# Patient Record
Sex: Female | Born: 1995 | Race: White | Hispanic: No | Marital: Married | Smoking: Former smoker
Health system: Southern US, Community
[De-identification: ages and names within clinical notes are randomized; demographics above are authoritative.]

## PROBLEM LIST (undated history)

## (undated) DIAGNOSIS — Z91018 Allergy to other foods: Secondary | ICD-10-CM

## (undated) DIAGNOSIS — I10 Essential (primary) hypertension: Secondary | ICD-10-CM

## (undated) DIAGNOSIS — O10913 Unspecified pre-existing hypertension complicating pregnancy, third trimester: Secondary | ICD-10-CM

## (undated) DIAGNOSIS — F419 Anxiety disorder, unspecified: Secondary | ICD-10-CM

## (undated) DIAGNOSIS — I4891 Unspecified atrial fibrillation: Secondary | ICD-10-CM

## (undated) HISTORY — PX: WISDOM TOOTH EXTRACTION: SHX21

## (undated) HISTORY — DX: Unspecified atrial fibrillation: I48.91

## (undated) HISTORY — DX: Anxiety disorder, unspecified: F41.9

## (undated) HISTORY — PX: NO PAST SURGERIES: SHX2092

## (undated) HISTORY — DX: Unspecified pre-existing hypertension complicating pregnancy, third trimester: O10.913

---
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
## 2014-06-07 IMAGING — CR DG CHEST 2V
2 series · 2 of 2 positions shown · non-contrast
Comparison: None.

CLINICAL DATA: Trauma.  Animal bite.

CHEST - 2 VIEW

[x chest ap]
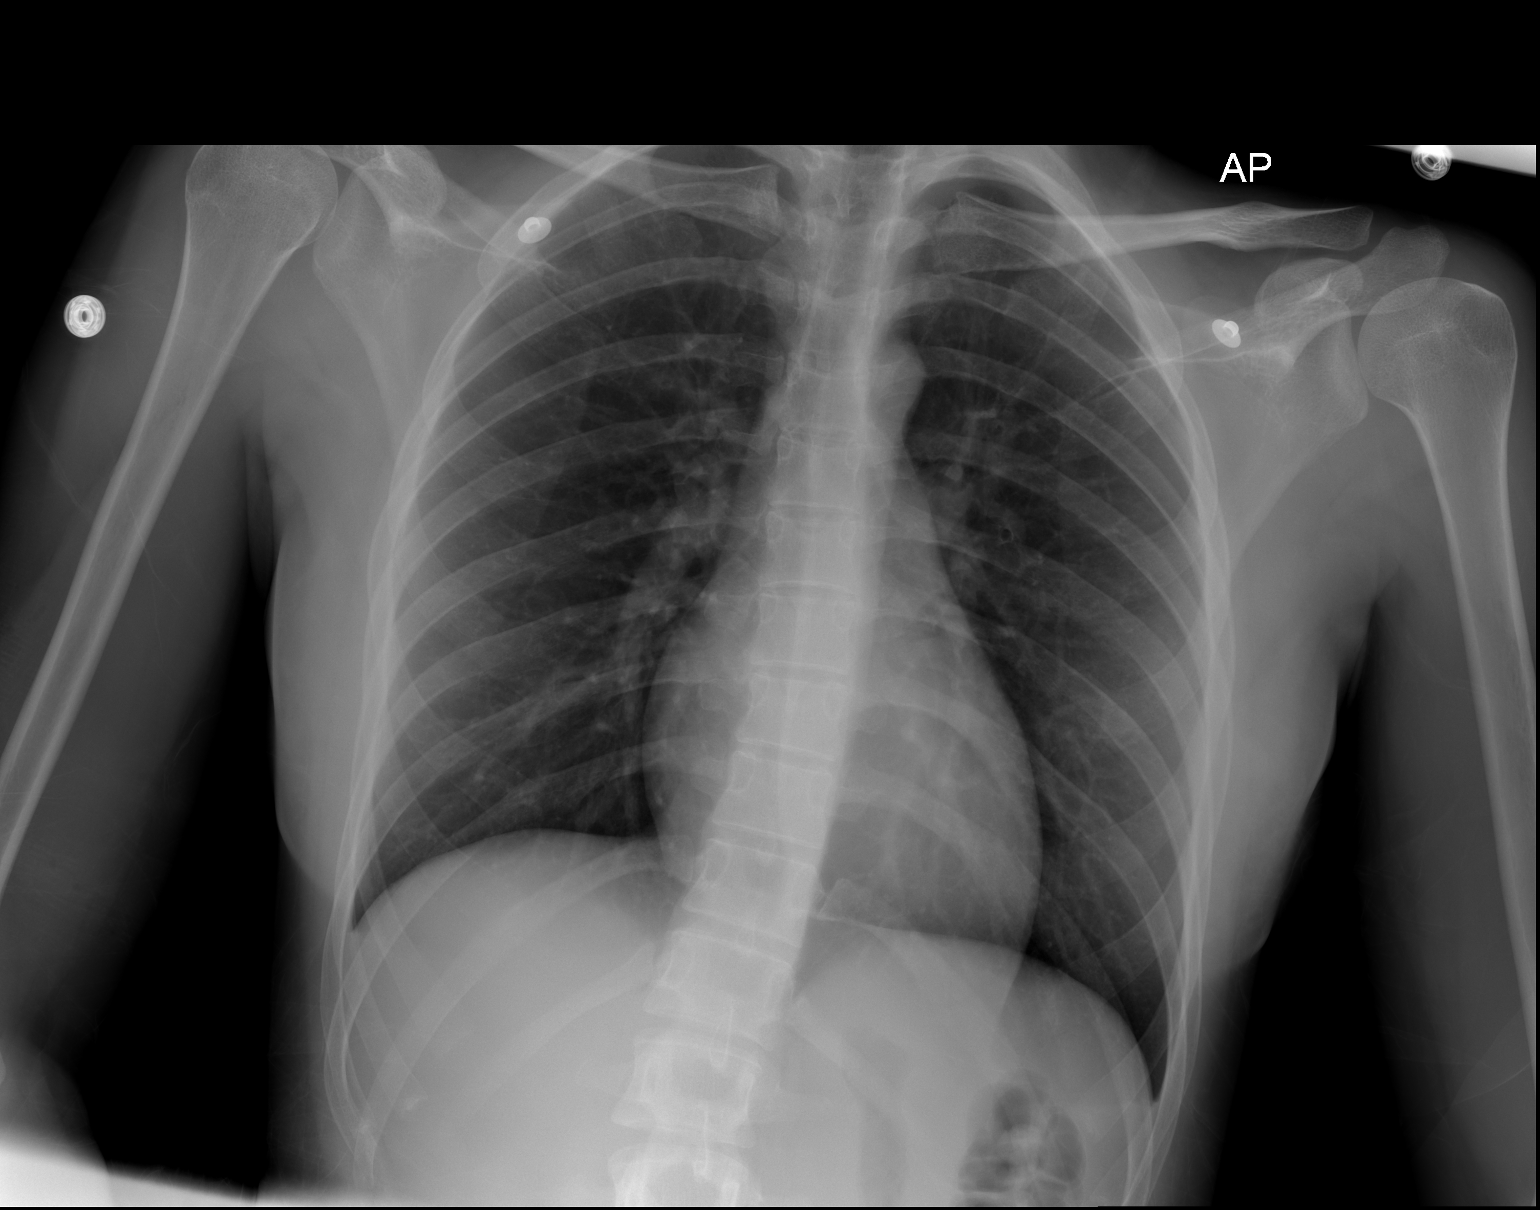

[w chest lat]
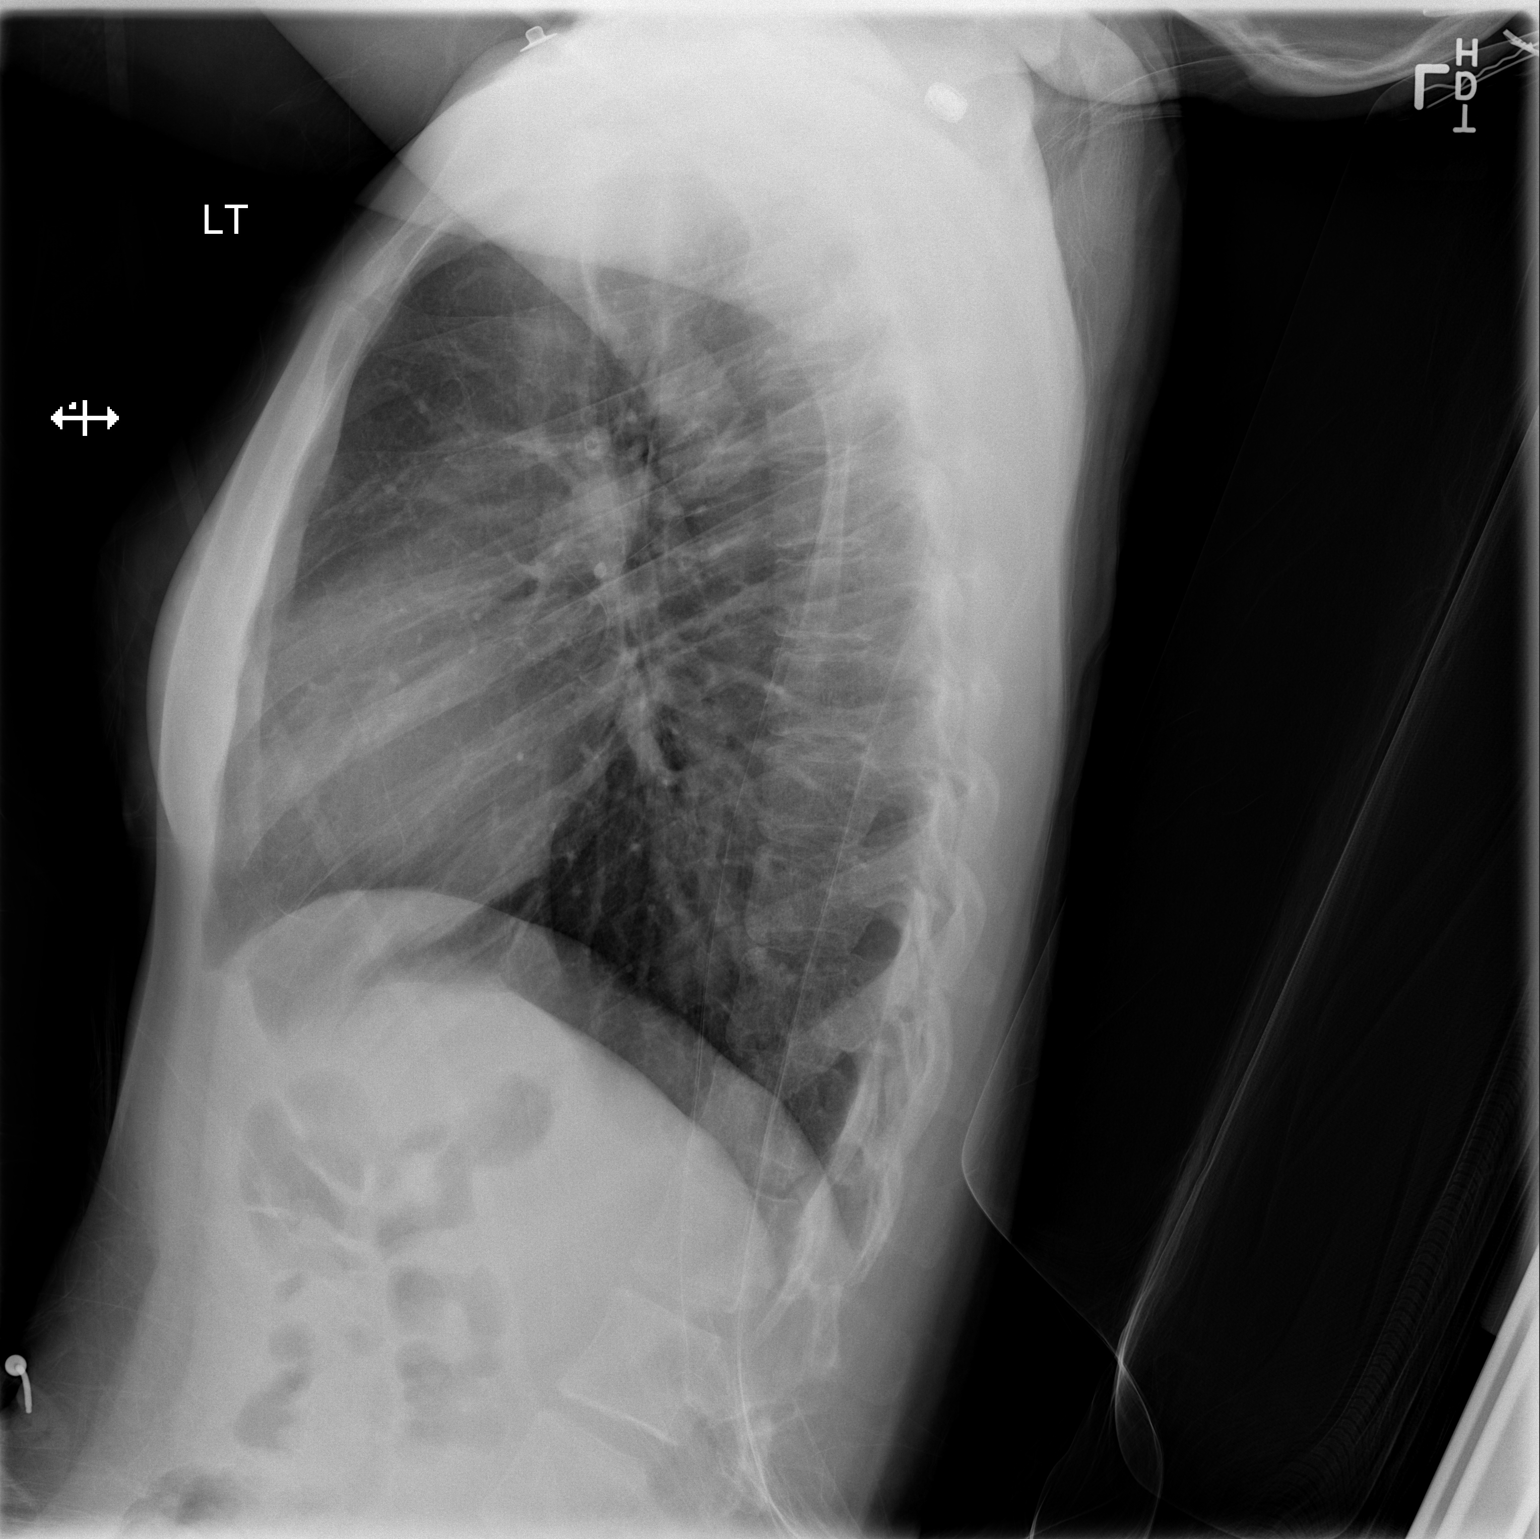

[2 of 2 positions shown; findings below may reference images not displayed]

FINDINGS: The heart size is normal.  The lungs are clear.  The
visualized soft tissues and bony thorax are unremarkable.
IMPRESSION: Negative two-view chest.

## 2014-06-07 IMAGING — CR DG WRIST COMPLETE 3+V*L*
5 series · 5 of 5 positions shown · non-contrast
Comparison: None.

CLINICAL DATA: Dog bite to wrist.  Wrist pain, swelling, and
lacerations.

LEFT WRIST - COMPLETE 3+ VIEW

[x wrist navicular view left (1 of 2)]
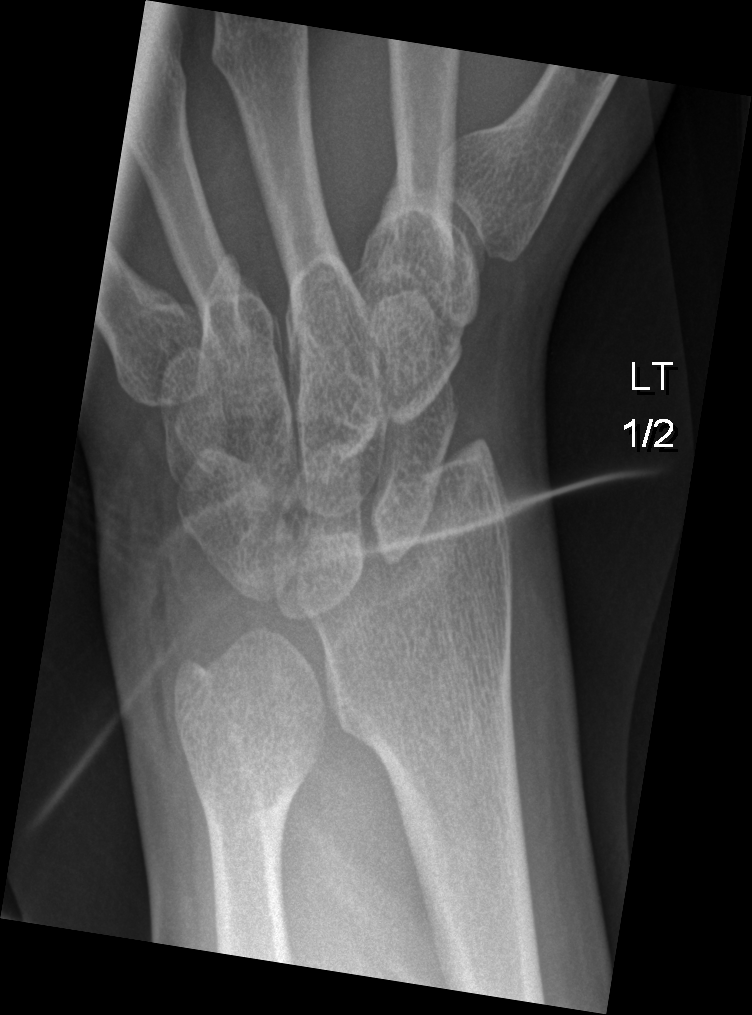

[x wrist obl left (1 of 2)]
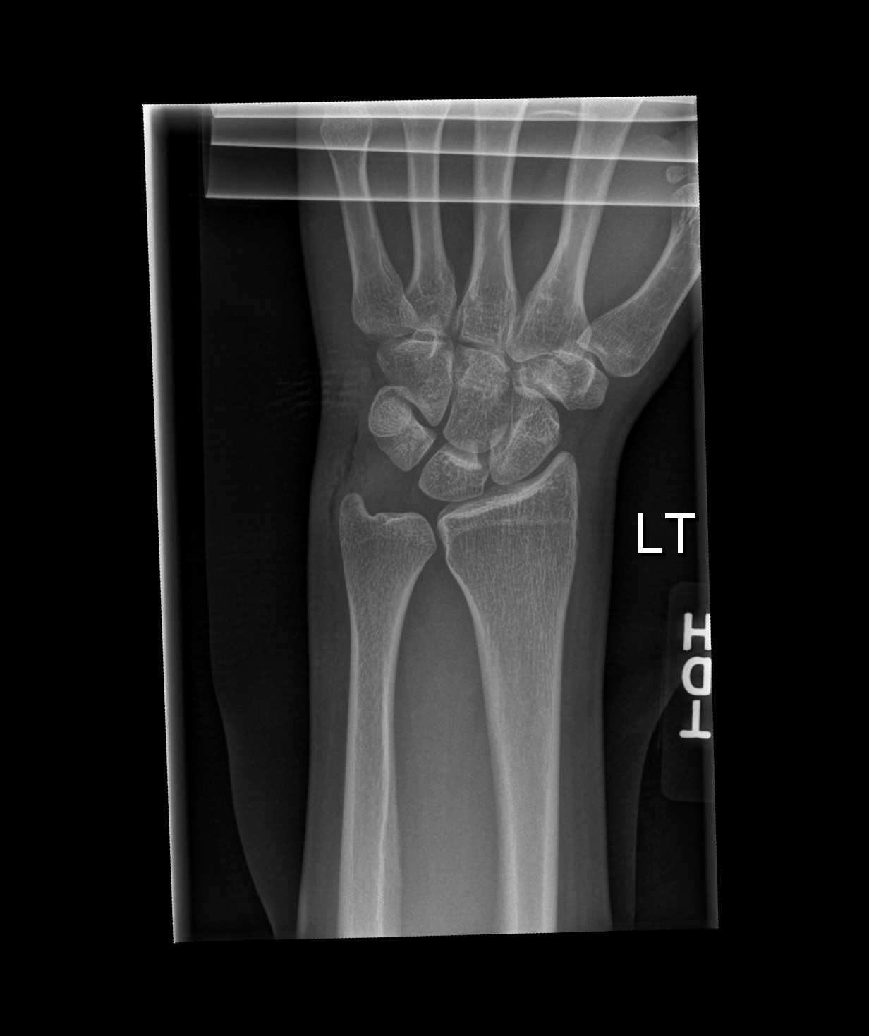

[x wrist obl left (2 of 2)]
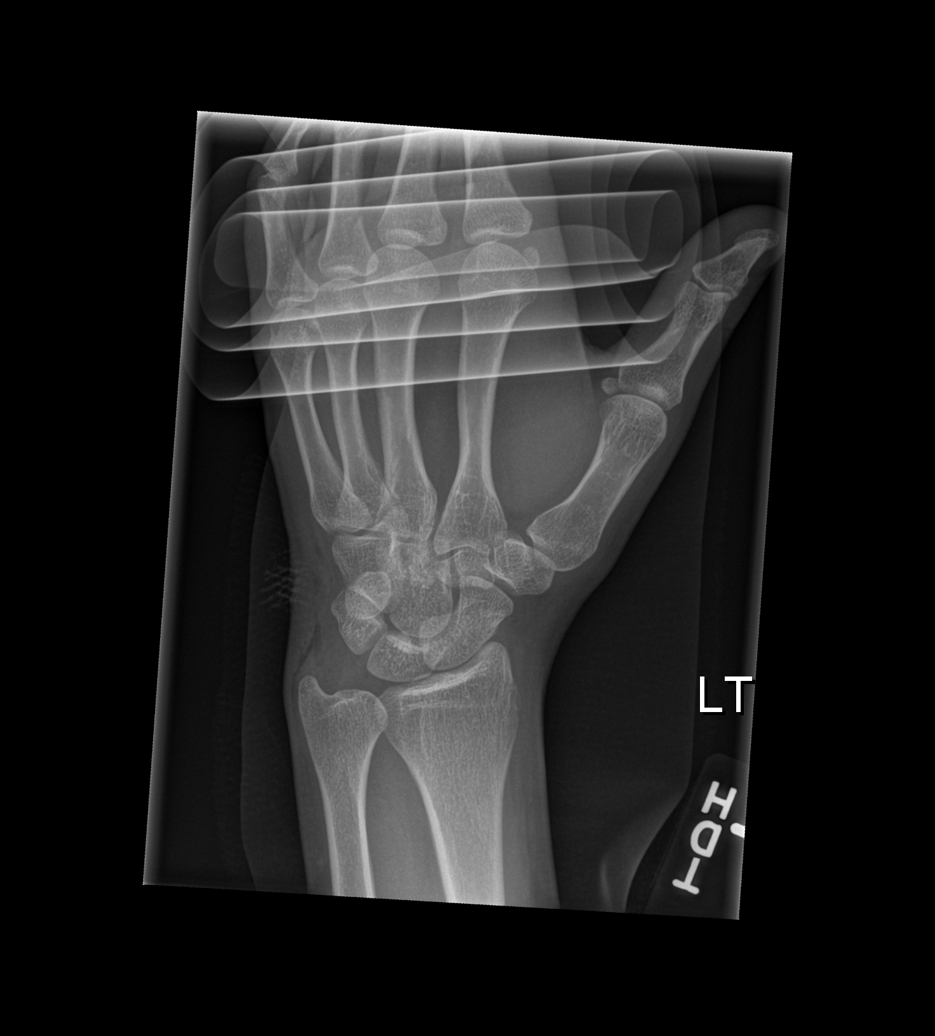

[x wrist navicular view left (2 of 2)]
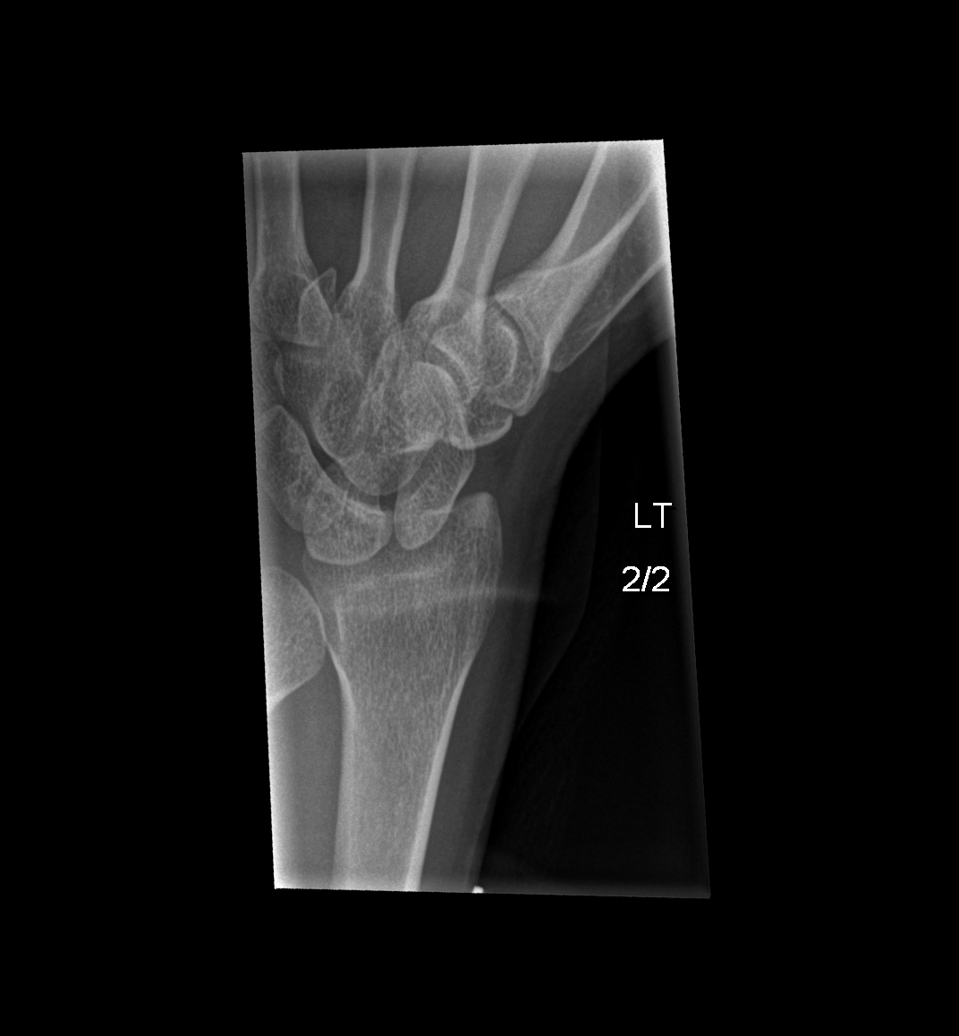

[x wrist lat left]
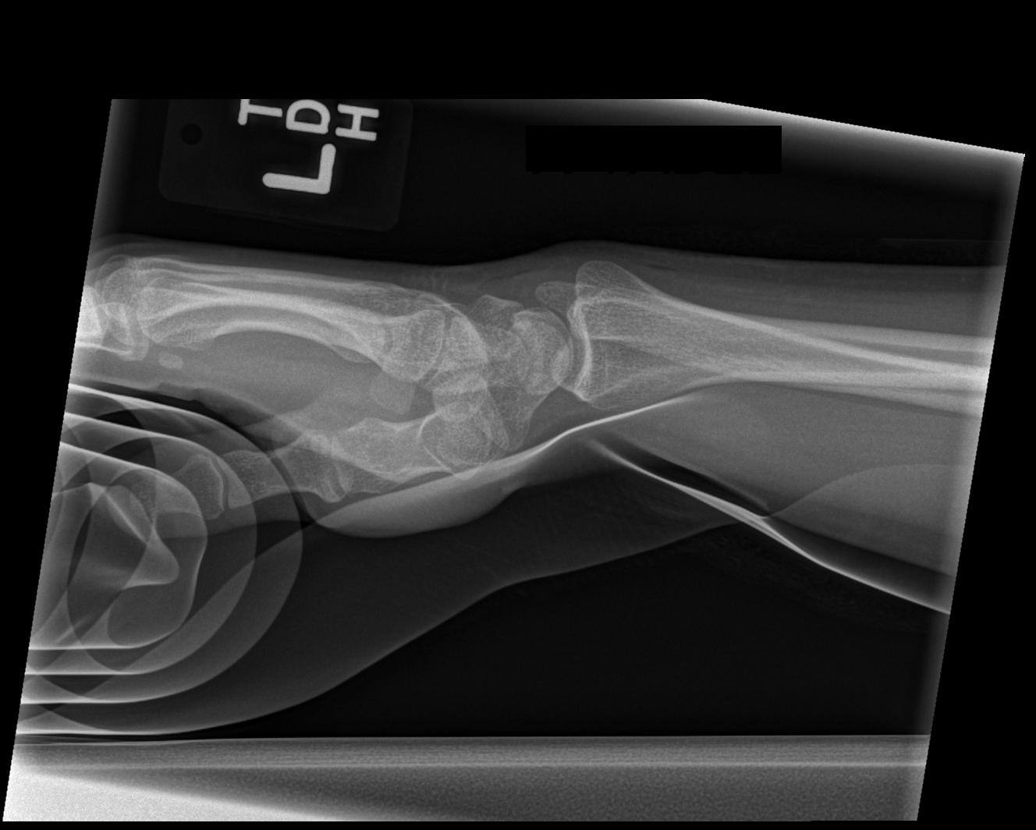

[5 of 5 positions shown; findings below may reference images not displayed]

FINDINGS: Subcutaneous emphysema is seen in the ulnar soft tissues,
however there is no evidence of radiopaque foreign body.  No
evidence of fracture or other bone abnormality.
IMPRESSION: Lateral subcutaneous emphysema.  No radiopaque foreign body or
osseous abnormality.

## 2014-06-07 IMAGING — CR DG HUMERUS 2V *R*
2 series · 2 of 2 positions shown · non-contrast
Comparison: None.

CLINICAL DATA: Dog bite. Right arm pain and lacerations.

RIGHT HUMERUS - 2+ VIEW

[x humerus lat right]
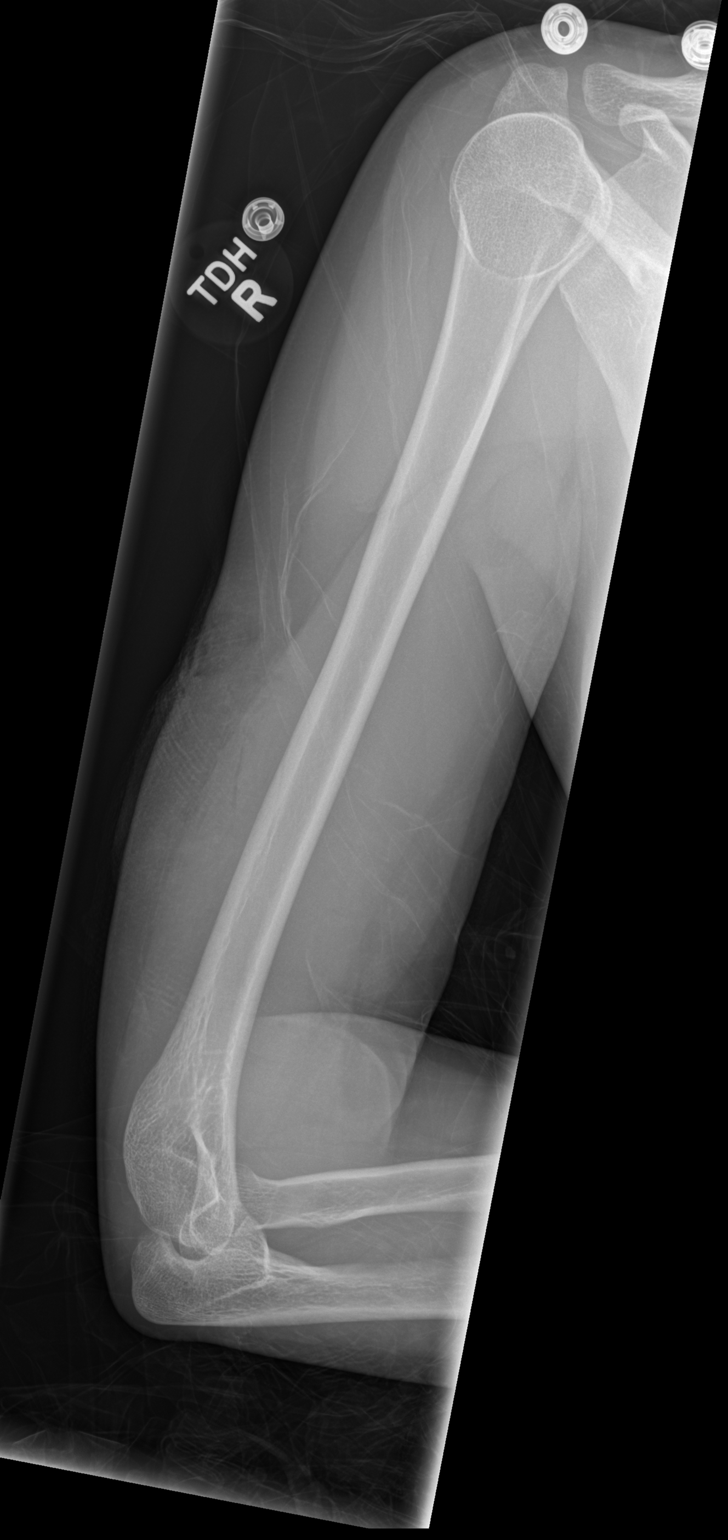

[x humerus ap right]
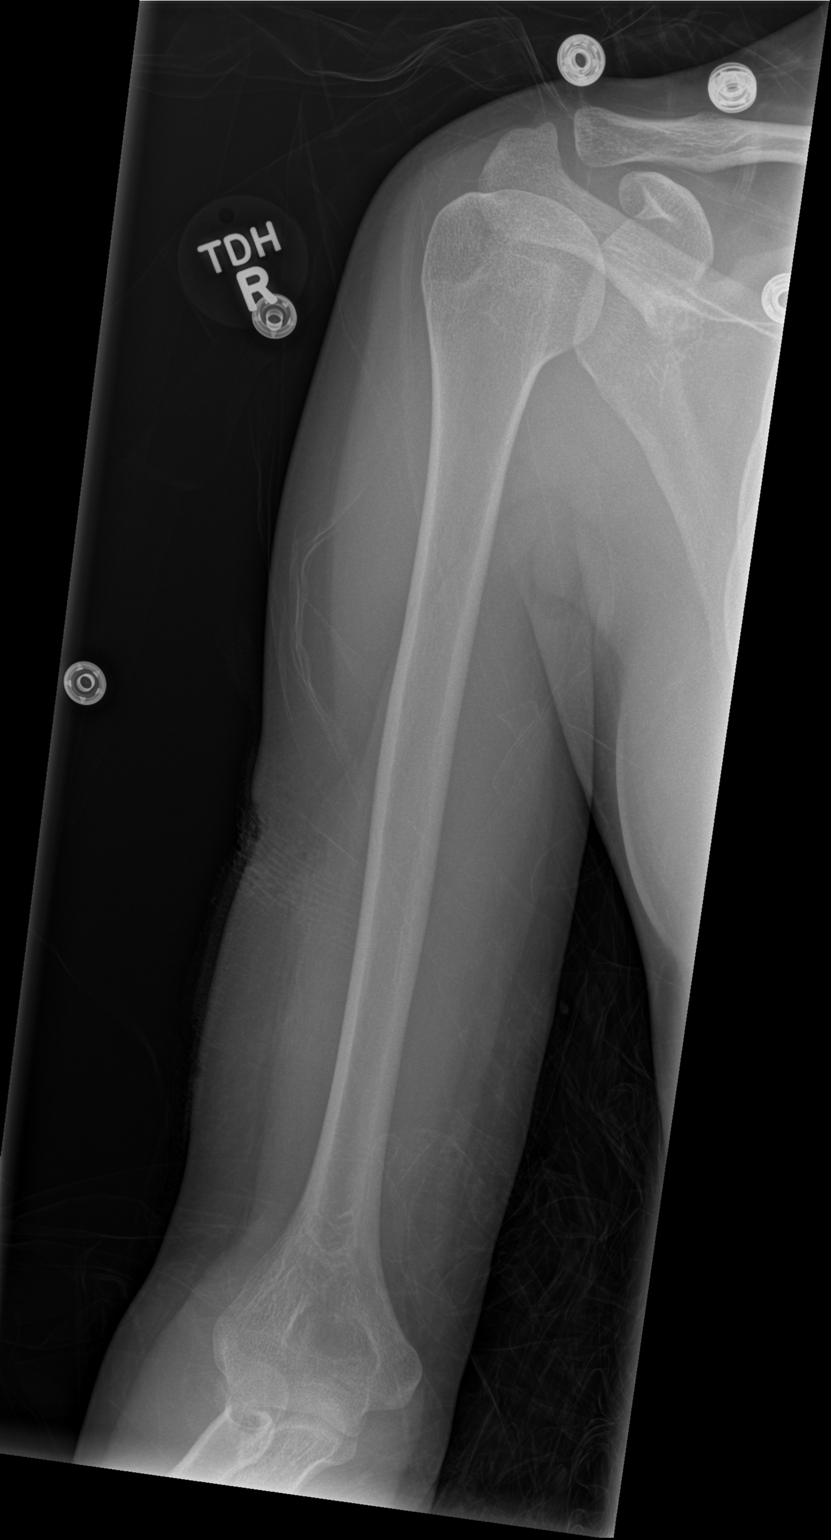

[2 of 2 positions shown; findings below may reference images not displayed]

FINDINGS: No evidence of fracture or other bone lesions.  No
evidence of radiopaque foreign body.  Soft tissues are
unremarkable.
IMPRESSION: Negative.

## 2014-09-14 ENCOUNTER — Emergency Department: Payer: Self-pay | Admitting: Emergency Medicine

## 2015-11-23 ENCOUNTER — Ambulatory Visit: Payer: Self-pay | Admitting: Physician Assistant

## 2015-11-30 ENCOUNTER — Encounter: Payer: Self-pay | Admitting: Physician Assistant

## 2015-11-30 ENCOUNTER — Ambulatory Visit: Payer: Self-pay | Admitting: Physician Assistant

## 2015-11-30 VITALS — BP 110/60 | HR 82 | Temp 98.3°F

## 2015-11-30 DIAGNOSIS — J018 Other acute sinusitis: Secondary | ICD-10-CM

## 2015-11-30 MED ORDER — AMOXICILLIN-POT CLAVULANATE 875-125 MG PO TABS: 1.0000 | ORAL_TABLET | Freq: Two times a day (BID) | ORAL | Status: DC

## 2015-11-30 MED ORDER — FLUCONAZOLE 150 MG PO TABS: ORAL_TABLET | ORAL | Status: DC

## 2015-11-30 MED ORDER — FLUTICASONE PROPIONATE 50 MCG/ACT NA SUSP: 2.0000 | Freq: Every day | NASAL | Status: DC

## 2015-11-30 NOTE — Progress Notes (Signed)
S: C/o runny nose and congestion for 3 weeks, no fever, chills, cp/sob, v/d; mucus is green and thick, cough is sporadic, c/o of facial and dental pain.   Using flonase without relief   O: PE: vitals wnl, nad,  perrl eomi, normocephalic, tms dull, nasal mucosa red and swollen, throat injected, neck supple no lymph, lungs c t a, cv rrr, neuro intact  A:  Acute sinusitis   P: augmentin 875mg  bid x 10d, flonase, diflucan, drink fluids, continue regular meds , use otc meds of choice, return if not improving in 5 days, return earlier if worsening

## 2016-02-06 ENCOUNTER — Ambulatory Visit: Payer: Self-pay | Admitting: Physician Assistant

## 2016-07-23 ENCOUNTER — Encounter: Payer: Self-pay | Admitting: Physician Assistant

## 2016-07-23 ENCOUNTER — Ambulatory Visit: Payer: Self-pay | Admitting: Physician Assistant

## 2016-07-23 VITALS — BP 100/75 | HR 86 | Temp 98.1°F

## 2016-07-23 DIAGNOSIS — T7840XA Allergy, unspecified, initial encounter: Secondary | ICD-10-CM

## 2016-07-23 NOTE — Progress Notes (Signed)
S: c/o both eyelids being swollen, was worse last night, took some benadryl, got a little better today but is still swollen, the only new thing is she changed mascara 2 months ago, no new face or eye cream, no fever/chills  O: vitals wnl, nad, perrl eomi, upper lids swollen b/l, no sty noted, no drainage, n/v intact  A: allergic reaction  P: explained to pt that its most likely an allergic reaction, would change out the mascara just in case, take benadryl or claritin

## 2017-01-17 ENCOUNTER — Ambulatory Visit: Payer: Self-pay | Admitting: Physician Assistant

## 2017-01-17 ENCOUNTER — Encounter: Payer: Self-pay | Admitting: Physician Assistant

## 2017-01-17 VITALS — BP 112/80 | HR 85 | Temp 98.4°F

## 2017-01-17 DIAGNOSIS — J039 Acute tonsillitis, unspecified: Secondary | ICD-10-CM

## 2017-01-17 LAB — POCT RAPID STREP A (OFFICE): Rapid Strep A Screen: NEGATIVE

## 2017-01-17 MED ORDER — AZITHROMYCIN 250 MG PO TABS: ORAL_TABLET | ORAL | 0 refills | Status: DC

## 2017-01-17 NOTE — Progress Notes (Signed)
S: C/o runny nose, sore throat and congestion for 3 days, no fever, chills, cp/sob, v/d; mucus is green and thick in the mornings and late at night  Using otc meds:   O: PE: vitals wnl, nad, perrl eomi, normocephalic, tms dull, nasal mucosa red and swollen, throat injected, neck supple no lymph, lungs c t a, cv rrr, neuro intact, q strep  A:  Acute    P: drink fluids, continue regular meds , use otc meds of choice, return if not improving in 5 days, return earlier if worsening , zpack

## 2017-01-30 ENCOUNTER — Ambulatory Visit (INDEPENDENT_AMBULATORY_CARE_PROVIDER_SITE_OTHER): Payer: Managed Care, Other (non HMO) | Admitting: Certified Nurse Midwife

## 2017-01-30 ENCOUNTER — Encounter: Payer: Self-pay | Admitting: Certified Nurse Midwife

## 2017-01-30 VITALS — BP 123/84 | HR 84 | Ht 60.0 in | Wt 116.0 lb

## 2017-01-30 DIAGNOSIS — Z8669 Personal history of other diseases of the nervous system and sense organs: Secondary | ICD-10-CM | POA: Insufficient documentation

## 2017-01-30 DIAGNOSIS — F329 Major depressive disorder, single episode, unspecified: Secondary | ICD-10-CM

## 2017-01-30 DIAGNOSIS — Z1159 Encounter for screening for other viral diseases: Secondary | ICD-10-CM | POA: Diagnosis not present

## 2017-01-30 DIAGNOSIS — G43019 Migraine without aura, intractable, without status migrainosus: Secondary | ICD-10-CM | POA: Diagnosis not present

## 2017-01-30 DIAGNOSIS — Z202 Contact with and (suspected) exposure to infections with a predominantly sexual mode of transmission: Secondary | ICD-10-CM

## 2017-01-30 DIAGNOSIS — F32A Depression, unspecified: Secondary | ICD-10-CM

## 2017-01-30 DIAGNOSIS — G43909 Migraine, unspecified, not intractable, without status migrainosus: Secondary | ICD-10-CM | POA: Insufficient documentation

## 2017-01-30 MED ORDER — SERTRALINE HCL 25 MG PO TABS: 25.0000 mg | ORAL_TABLET | Freq: Every day | ORAL | 5 refills | Status: DC

## 2017-01-30 MED ORDER — SERTRALINE HCL 25 MG PO TABS: 25.0000 mg | ORAL_TABLET | Freq: Every day | ORAL | 1 refills | Status: DC
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
## 2017-01-30 NOTE — Patient Instructions (Signed)
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
## 2017-02-01 LAB — RPR: RPR: NONREACTIVE

## 2017-02-01 LAB — HEPATITIS B SURFACE ANTIGEN: HEP B S AG: NEGATIVE

## 2017-02-01 LAB — HSV(HERPES SIMPLEX VRS) I + II AB-IGG

## 2017-02-01 LAB — HEPATITIS C ANTIBODY: Hep C Virus Ab: <0.1 s/co ratio (ref 0.0–0.9)

## 2017-02-01 LAB — GC/CHLAMYDIA PROBE AMP
Chlamydia trachomatis, NAA: NEGATIVE
NEISSERIA GONORRHOEAE BY PCR: NEGATIVE

## 2017-02-01 LAB — HIV ANTIBODY (ROUTINE TESTING W REFLEX): HIV Screen 4th Generation wRfx: NONREACTIVE

## 2017-03-13 ENCOUNTER — Ambulatory Visit (INDEPENDENT_AMBULATORY_CARE_PROVIDER_SITE_OTHER): Payer: Managed Care, Other (non HMO) | Admitting: Certified Nurse Midwife

## 2017-03-13 ENCOUNTER — Encounter: Payer: Self-pay | Admitting: Certified Nurse Midwife

## 2017-03-13 ENCOUNTER — Other Ambulatory Visit: Payer: Self-pay

## 2017-03-13 VITALS — BP 120/93 | HR 121 | Ht 60.0 in | Wt 118.9 lb

## 2017-03-13 DIAGNOSIS — Z124 Encounter for screening for malignant neoplasm of cervix: Secondary | ICD-10-CM

## 2017-03-13 DIAGNOSIS — Z01419 Encounter for gynecological examination (general) (routine) without abnormal findings: Secondary | ICD-10-CM

## 2017-03-13 DIAGNOSIS — Z0000 Encounter for general adult medical examination without abnormal findings: Secondary | ICD-10-CM

## 2017-03-13 NOTE — Patient Instructions (Signed)
Preventive Care 18-39 Years, Female Preventive care refers to lifestyle choices and visits with your health care provider that can promote health and wellness. What does preventive care include?  A yearly physical exam. This is also called an annual well check.  Dental exams once or twice a year.  Routine eye exams. Ask your health care provider how often you should have your eyes checked.  Personal lifestyle choices, including:  Daily care of your teeth and gums.  Regular physical activity.  Eating a healthy diet.  Avoiding tobacco and drug use.  Limiting alcohol use.  Practicing safe sex.  Taking vitamin and mineral supplements as recommended by your health care provider. What happens during an annual well check? The services and screenings done by your health care provider during your annual well check will depend on your age, overall health, lifestyle risk factors, and family history of disease. Counseling  Your health care provider may ask you questions about your:  Alcohol use.  Tobacco use.  Drug use.  Emotional well-being.  Home and relationship well-being.  Sexual activity.  Eating habits.  Work and work environment.  Method of birth control.  Menstrual cycle.  Pregnancy history. Screening  You may have the following tests or measurements:  Height, weight, and BMI.  Diabetes screening. This is done by checking your blood sugar (glucose) after you have not eaten for a while (fasting).  Blood pressure.  Lipid and cholesterol levels. These may be checked every 5 years starting at age 20.  Skin check.  Hepatitis C blood test.  Hepatitis B blood test.  Sexually transmitted disease (STD) testing.  BRCA-related cancer screening. This may be done if you have a family history of breast, ovarian, tubal, or peritoneal cancers.  Pelvic exam and Pap test. This may be done every 3 years starting at age 21. Starting at age 30, this may be done every 5  years if you have a Pap test in combination with an HPV test. Discuss your test results, treatment options, and if necessary, the need for more tests with your health care provider. Vaccines  Your health care provider may recommend certain vaccines, such as:  Influenza vaccine. This is recommended every year.  Tetanus, diphtheria, and acellular pertussis (Tdap, Td) vaccine. You may need a Td booster every 10 years.  Varicella vaccine. You may need this if you have not been vaccinated.  HPV vaccine. If you are 26 or younger, you may need three doses over 6 months.  Measles, mumps, and rubella (MMR) vaccine. You may need at least one dose of MMR. You may also need a second dose.  Pneumococcal 13-valent conjugate (PCV13) vaccine. You may need this if you have certain conditions and were not previously vaccinated.  Pneumococcal polysaccharide (PPSV23) vaccine. You may need one or two doses if you smoke cigarettes or if you have certain conditions.  Meningococcal vaccine. One dose is recommended if you are age 19-21 years and a first-year college student living in a residence hall, or if you have one of several medical conditions. You may also need additional booster doses.  Hepatitis A vaccine. You may need this if you have certain conditions or if you travel or work in places where you may be exposed to hepatitis A.  Hepatitis B vaccine. You may need this if you have certain conditions or if you travel or work in places where you may be exposed to hepatitis B.  Haemophilus influenzae type b (Hib) vaccine. You may need this   if you have certain risk factors. Talk to your health care provider about which screenings and vaccines you need and how often you need them. This information is not intended to replace advice given to you by your health care provider. Make sure you discuss any questions you have with your health care provider. Document Released: 02/04/2002 Document Revised: 08/28/2016  Document Reviewed: 10/10/2015 Elsevier Interactive Patient Education  2017 [employer].

## 2017-03-13 NOTE — Progress Notes (Signed)
ANNUAL PREVENTATIVE CARE GYN  ENCOUNTER NOTE  Subjective:       Dana Hood is a 21 y.o. G0P0000 female here for a routine annual gynecologic exam.    She started Zoloft 25 mg six (6) weeks ago and is "doing well". She does not want to increase her dose at this time.   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   Denies difficulty breathing or respiratory distress, chest pain, abdominal pain, dysuria, vaginal bleeding, and leg pain or swelling.    Gynecologic History  Patient's last menstrual period was 03/09/2017 (exact date).   Contraception: NuvaRing vaginal inserts   Last Pap: 2015. Results were: normal  Obstetric History OB History  Gravida Para Term Preterm AB Living  0 0 0 0 0 0  SAB TAB Ectopic Multiple Live Births  0 0 0 0 0        No past medical history on file.  No past surgical history on file.  Current Outpatient Prescriptions on File Prior to Visit  Medication Sig Dispense Refill  . NUVARING 0.12-0.015 MG/24HR vaginal ring PLACE 1 RING VAGINALLY MONTHLY. LEAVE IN PLACE FOR 3 CONSECUTIVE WEEKS, THEN REMOVE FOR 1 WEEK.  6  . sertraline (ZOLOFT) 25 MG tablet Take 1 tablet (25 mg total) by mouth daily. 30 tablet 1   No current facility-administered medications on file prior to visit.     No Known Allergies  Social History   Social History  . Marital status: Single    Spouse name: N/A  . Number of children: N/A  . Years of education: N/A   Occupational History  . Not on file.   Social History Main Topics  . Smoking status: Never Smoker  . Smokeless tobacco: Never Used  . Alcohol use No  . Drug use: No  . Sexual activity: Not Currently    Partners: Male    Birth control/ protection: Inserts     Comment: nuvaring   Other Topics Concern  . Not on file   Social History Narrative  . No narrative on file    Family History  Problem Relation Age of Onset  . Hyperlipidemia Mother   . Hypertension Mother    . Migraines Mother   . Heart failure Maternal Grandfather   . Rheum arthritis Maternal Uncle     The following portions of the patient's history were reviewed and updated as appropriate: allergies, current medications, past family history, past medical history, past social history, past surgical history and problem list.  Review of Systems  ROS negative except as noted above. Information obtained from patient.    Objective:   Ht 5' (1.524 m)   Wt 118 lb 14.4 oz (53.9 kg)   LMP 03/09/2017 (Exact Date)   BMI 23.22 kg/m    CONSTITUTIONAL: Well-developed, well-nourished female in no acute distress.   PSYCHIATRIC: Normal mood and affect. Normal behavior. Normal judgment and thought content.  NEUROLGIC: Alert and oriented to person, place, and time. Normal muscle tone coordination. No cranial nerve deficit noted.  HENT:  Normocephalic, atraumatic, External right and left ear normal.  EYES: Conjunctivae and EOM are normal. Pupils are equal, round, and reactive to light.    NECK: Normal range of motion, supple, no masses.  Normal thyroid.   SKIN: Skin is warm and dry. No rash noted. Not diaphoretic. No erythema. No pallor.  CARDIOVASCULAR: Normal heart rate noted, regular rhythm, no murmur.  RESPIRATORY: Clear to auscultation bilaterally.  Effort and breath sounds normal, no problems with respiration noted.  BREASTS: Symmetric in size. No masses, skin changes, nipple drainage, or lymphadenopathy.  ABDOMEN: Soft, normal bowel sounds, no distention noted.  No tenderness, rebound or guarding.   PELVIC:  External Genitalia: Normal  Vagina: Normal  Cervix: Normal  Uterus: Normal  Adnexa: Normal  MUSCULOSKELETAL: Normal range of motion. No tenderness.  No cyanosis, clubbing, or edema.  2+ distal pulses.  LYMPHATIC: No Axillary, Supraclavicular, or Inguinal Adenopathy.  Assessment:   Annual gynecologic examination 21 y.o.   Contraception: NuvaRing vaginal inserts   Normal  BMI Problem List Items Addressed This Visit    None    Visit Diagnoses    Encounter for wellness examination    -  Primary   Relevant Orders   Cytology - PAP   CBC   Comprehensive metabolic panel   Vitamin D 1,25 dihydroxy   Lipid panel   Screening for cervical cancer          Plan:   Pap: Pap, Reflex if ASCUS  Labs: Lipid 1 and Vit D Level""CBC, CMP, See orders   Routine preventative health maintenance measures emphasized: Exercise/Diet/Weight control, Stress Management, Peer Pressure Issues and Safe Sex  Return to Clinic - 1 Year or sooner if needed   Gunnar BullaJenkins Michelle Heavenleigh Petruzzi, CNM

## 2017-03-18 ENCOUNTER — Other Ambulatory Visit: Payer: Managed Care, Other (non HMO)

## 2017-03-18 DIAGNOSIS — Z0000 Encounter for general adult medical examination without abnormal findings: Secondary | ICD-10-CM

## 2017-03-19 LAB — CYTOLOGY - PAP

## 2017-03-23 LAB — CBC
Hematocrit: 42.5 % (ref 34.0–46.6)
Hemoglobin: 14.4 g/dL (ref 11.1–15.9)
MCH: 30.5 pg (ref 26.6–33.0)
MCHC: 33.9 g/dL (ref 31.5–35.7)
MCV: 90 fL (ref 79–97)
PLATELETS: 276 10*3/uL (ref 150–379)
RBC: 4.72 x10E6/uL (ref 3.77–5.28)
RDW: 13.5 % (ref 12.3–15.4)
WBC: 7.2 10*3/uL (ref 3.4–10.8)

## 2017-03-23 LAB — COMPREHENSIVE METABOLIC PANEL
ALBUMIN: 4.5 g/dL (ref 3.5–5.5)
ALK PHOS: 67 IU/L (ref 39–117)
ALT: 11 IU/L (ref 0–32)
AST: 17 IU/L (ref 0–40)
Albumin/Globulin Ratio: 1.5 (ref 1.2–2.2)
BUN / CREAT RATIO: 10 (ref 9–23)
BUN: 8 mg/dL (ref 6–20)
CHLORIDE: 98 mmol/L (ref 96–106)
CO2: 22 mmol/L (ref 18–29)
Calcium: 9.6 mg/dL (ref 8.7–10.2)
Creatinine, Ser: 0.79 mg/dL (ref 0.57–1.00)
GFR calc Af Amer: 124 mL/min/{1.73_m2} (ref >59)
GFR calc non Af Amer: 107 mL/min/{1.73_m2} (ref >59)
Globulin, Total: 3 g/dL (ref 1.5–4.5)
Glucose: 83 mg/dL (ref 65–99)
Potassium: 3.9 mmol/L (ref 3.5–5.2)
Sodium: 139 mmol/L (ref 134–144)
Total Protein: 7.5 g/dL (ref 6.0–8.5)

## 2017-03-23 LAB — LIPID PANEL
CHOLESTEROL TOTAL: 181 mg/dL (ref 100–199)
Chol/HDL Ratio: 2.8 ratio units (ref 0.0–4.4)
HDL: 65 mg/dL (ref >39)
LDL CALC: 95 mg/dL (ref 0–99)
Triglycerides: 103 mg/dL (ref 0–149)
VLDL Cholesterol Cal: 21 mg/dL (ref 5–40)

## 2017-03-23 LAB — VITAMIN D 1,25 DIHYDROXY
VITAMIN D 1, 25 (OH) TOTAL: 60 pg/mL
Vitamin D3 1, 25 (OH)2: 59 pg/mL

## 2017-04-15 ENCOUNTER — Encounter: Payer: Self-pay | Admitting: Certified Nurse Midwife

## 2017-04-22 ENCOUNTER — Encounter: Payer: Self-pay | Admitting: Certified Nurse Midwife

## 2017-04-23 ENCOUNTER — Other Ambulatory Visit: Payer: Self-pay

## 2017-04-23 MED ORDER — SERTRALINE HCL 50 MG PO TABS: 50.0000 mg | ORAL_TABLET | Freq: Every day | ORAL | 1 refills | Status: DC

## 2017-05-27 ENCOUNTER — Encounter: Payer: Self-pay | Admitting: Physician Assistant

## 2017-05-27 ENCOUNTER — Ambulatory Visit: Payer: Self-pay | Admitting: Physician Assistant

## 2017-05-27 VITALS — BP 137/87 | HR 84 | Temp 98.4°F

## 2017-05-27 DIAGNOSIS — J069 Acute upper respiratory infection, unspecified: Secondary | ICD-10-CM

## 2017-05-27 MED ORDER — AZITHROMYCIN 250 MG PO TABS: ORAL_TABLET | ORAL | 0 refills | Status: DC

## 2017-05-27 NOTE — Progress Notes (Signed)
S: C/o runny nose and congestion for 5 days, had a cold for 2 weeks prior to that, no fever, chills, cp/sob, v/d; mucus was yellow mixed with clear, no otc meds, did try allegra but it didn't help  Using otc meds:   O: PE: vitals wnl, nad, perrl eomi, normocephalic, tms dull, nasal mucosa red and swollen, throat injected, neck supple no lymph, lungs c t a, cv rrr, neuro intact  A:  Acute uri   P: drink fluids, continue regular meds , use otc meds of choice, return if not improving in 5 days, return earlier if worsening , zpack

## 2017-06-15 ENCOUNTER — Other Ambulatory Visit: Payer: Self-pay | Admitting: Certified Nurse Midwife

## 2017-07-03 ENCOUNTER — Telehealth: Payer: Self-pay | Admitting: Certified Nurse Midwife

## 2017-07-03 NOTE — Telephone Encounter (Signed)
Patient called wanting to speak with [name], Patient made an appointment for Monday 07/07/17 and wanting a refill on her medication before the appointment sertraline (ZOLOFT) 50 MG tablet. The pharmacy stated that she no longer has any more refills on meds and probably needs to be seen top get a refill. Patient did not disclose any other information. Please advise.

## 2017-07-04 MED ORDER — SERTRALINE HCL 50 MG PO TABS: 50.0000 mg | ORAL_TABLET | Freq: Every day | ORAL | 10 refills | Status: DC

## 2017-07-04 NOTE — Telephone Encounter (Signed)
Pt last note was to f/u in 1 yr. Refill of zoloft ordered. Pt notified. Will cancel Monday's appt.

## 2017-07-07 ENCOUNTER — Encounter: Payer: Self-pay | Admitting: Certified Nurse Midwife

## 2017-07-16 ENCOUNTER — Encounter: Payer: Self-pay | Admitting: Certified Nurse Midwife

## 2017-07-16 ENCOUNTER — Telehealth: Payer: Self-pay | Admitting: Certified Nurse Midwife

## 2017-07-16 NOTE — Telephone Encounter (Signed)
Patient lvm to schedule an appointment with [name], I called the patient back to schedule an appointment and the voice is not set up yet. Thank you.

## 2017-07-29 ENCOUNTER — Encounter: Payer: Self-pay | Admitting: Physician Assistant

## 2017-07-29 ENCOUNTER — Ambulatory Visit: Payer: Self-pay | Admitting: Physician Assistant

## 2017-07-29 VITALS — BP 110/70 | HR 86 | Temp 98.3°F | Resp 16

## 2017-07-29 DIAGNOSIS — J029 Acute pharyngitis, unspecified: Secondary | ICD-10-CM

## 2017-07-29 NOTE — Progress Notes (Signed)
S: states she keeps getting sore throats, had tonsillitis 5 times this year, sore throat was worse yesterday, couldn't hardly swallow and tonsils felt swollen, is better today, ?if should see an ENT to have her tonsils removed, no fever/chills, no cough or congestion  O: vitals wnl nad, tms clear, throat wnl, neck supple no lymph lungs c t a, cv rrr  A: sore throat  P: refer to ENT for eval

## 2017-07-31 NOTE — Progress Notes (Signed)
Faxed referral to [facility] for evaluation and treatment for tonsillitis. Awaiting appointment day and time

## 2017-08-06 NOTE — Progress Notes (Signed)
Patient has been referred to see Dr. Wardell HeathBennet at [facility] on 08/22/2017 at 10:15.  Patient was notified about her appointment via secure e-mail.

## 2017-11-04 ENCOUNTER — Encounter: Payer: Self-pay | Admitting: Certified Nurse Midwife

## 2017-11-24 ENCOUNTER — Ambulatory Visit: Payer: Managed Care, Other (non HMO) | Admitting: Certified Nurse Midwife

## 2017-11-24 ENCOUNTER — Encounter: Payer: Self-pay | Admitting: Certified Nurse Midwife

## 2017-11-24 VITALS — BP 137/73 | HR 74 | Ht 60.0 in | Wt 122.5 lb

## 2017-11-24 DIAGNOSIS — F419 Anxiety disorder, unspecified: Secondary | ICD-10-CM | POA: Diagnosis not present

## 2017-11-24 DIAGNOSIS — N926 Irregular menstruation, unspecified: Secondary | ICD-10-CM | POA: Diagnosis not present

## 2017-11-24 DIAGNOSIS — F329 Major depressive disorder, single episode, unspecified: Secondary | ICD-10-CM | POA: Diagnosis not present

## 2017-11-24 DIAGNOSIS — R5383 Other fatigue: Secondary | ICD-10-CM

## 2017-11-24 DIAGNOSIS — F32A Depression, unspecified: Secondary | ICD-10-CM

## 2017-11-24 MED ORDER — HYDROXYZINE HCL 25 MG PO TABS: 25.0000 mg | ORAL_TABLET | Freq: Four times a day (QID) | ORAL | 2 refills | Status: DC | PRN

## 2017-11-24 MED ORDER — SERTRALINE HCL 100 MG PO TABS: 100.0000 mg | ORAL_TABLET | Freq: Every day | ORAL | 1 refills | Status: DC

## 2017-11-24 NOTE — Patient Instructions (Signed)
Hydroxyzine capsules or tablets What is this medicine? HYDROXYZINE (hye [place] i zeen) is an antihistamine. This medicine is used to treat allergy symptoms. It is also used to treat anxiety and tension. This medicine can be used with other medicines to induce sleep before surgery. This medicine may be used for other purposes; ask your health care provider or pharmacist if you have questions. COMMON BRAND NAME(S): ANX, Atarax, Rezine, Vistaril What should I tell my health care provider before I take this medicine? They need to know if you have any of these conditions: -any chronic illness -difficulty passing urine -glaucoma -heart disease -kidney disease -liver disease -lung disease -an unusual or allergic reaction to hydroxyzine, cetirizine, other medicines, foods, dyes, or preservatives -pregnant or trying to get pregnant -breast-feeding How should I use this medicine? Take this medicine by mouth with a full glass of water. Follow the directions on the prescription label. You may take this medicine with food or on an empty stomach. Take your medicine at regular intervals. Do not take your medicine more often than directed. Talk to your pediatrician regarding the use of this medicine in children. Special care may be needed. While this drug may be prescribed for children as young as 75 years of age for selected conditions, precautions do apply. Patients over 62 years old may have a stronger reaction and need a smaller dose. Overdosage: If you think you have taken too much of this medicine contact a poison control center or emergency room at once. NOTE: This medicine is only for you. Do not share this medicine with others. What if I miss a dose? If you miss a dose, take it as soon as you can. If it is almost time for your next dose, take only that dose. Do not take double or extra doses. What may interact with this medicine? -alcohol -barbiturate medicines for sleep or seizures -medicines for  colds, allergies -medicines for depression, anxiety, or emotional disturbances -medicines for pain -medicines for sleep -muscle relaxants This list may not describe all possible interactions. Give your health care provider a list of all the medicines, herbs, non-prescription drugs, or dietary supplements you use. Also tell them if you smoke, drink alcohol, or use illegal drugs. Some items may interact with your medicine. What should I watch for while using this medicine? Tell your doctor or health care professional if your symptoms do not improve. You may get drowsy or dizzy. Do not drive, use machinery, or do anything that needs mental alertness until you know how this medicine affects you. Do not stand or sit up quickly, especially if you are an older patient. This reduces the risk of dizzy or fainting spells. Alcohol may interfere with the effect of this medicine. Avoid alcoholic drinks. Your mouth may get dry. Chewing sugarless gum or sucking hard candy, and drinking plenty of water may help. Contact your doctor if the problem does not go away or is severe. This medicine may cause dry eyes and blurred vision. If you wear contact lenses you may feel some discomfort. Lubricating drops may help. See your eye doctor if the problem does not go away or is severe. If you are receiving skin tests for allergies, tell your doctor you are using this medicine. What side effects may I notice from receiving this medicine? Side effects that you should report to your doctor or health care professional as soon as possible: -fast or irregular heartbeat -difficulty passing urine -seizures -slurred speech or confusion -tremor Side effects that  usually do not require medical attention (report to your doctor or health care professional if they continue or are bothersome): -constipation -drowsiness -fatigue -headache -stomach upset This list may not describe all possible side effects. Call your doctor for  medical advice about side effects. You may report side effects to FDA at 1-800-FDA-1088. Where should I keep my medicine? Keep out of the reach of children. Store at room temperature between 15 and 30 degrees C (59 and 86 degrees F). Keep container tightly closed. Throw away any unused medicine after the expiration date. NOTE: This sheet is a summary. It may not cover all possible information. If you have questions about this medicine, talk to your doctor, pharmacist, or health care provider.  2018 Elsevier/Gold Standard (2008-04-22 14:50:59) Sertraline tablets What is this medicine? SERTRALINE (SER tra leen) is used to treat depression. It may also be used to treat obsessive compulsive disorder, panic disorder, post-trauma stress, premenstrual dysphoric disorder (PMDD) or social anxiety. This medicine may be used for other purposes; ask your health care provider or pharmacist if you have questions. COMMON BRAND NAME(S): Zoloft What should I tell my health care provider before I take this medicine? They need to know if you have any of these conditions: -bleeding disorders -bipolar disorder or a family history of bipolar disorder -glaucoma -heart disease -high blood pressure -history of irregular heartbeat -history of low levels of calcium, magnesium, or potassium in the blood -if you often drink alcohol -liver disease -receiving electroconvulsive therapy -seizures -suicidal thoughts, plans, or attempt; a previous suicide attempt by you or a family member -take medicines that treat or prevent blood clots -thyroid disease -an unusual or allergic reaction to sertraline, other medicines, foods, dyes, or preservatives -pregnant or trying to get pregnant -breast-feeding How should I use this medicine? Take this medicine by mouth with a glass of water. Follow the directions on the prescription label. You can take it with or without food. Take your medicine at regular intervals. Do not take  your medicine more often than directed. Do not stop taking this medicine suddenly except upon the advice of your doctor. Stopping this medicine too quickly may cause serious side effects or your condition may worsen. A special MedGuide will be given to you by the pharmacist with each prescription and refill. Be sure to read this information carefully each time. Talk to your pediatrician regarding the use of this medicine in children. While this drug may be prescribed for children as young as 7 years for selected conditions, precautions do apply. Overdosage: If you think you have taken too much of this medicine contact a poison control center or emergency room at once. NOTE: This medicine is only for you. Do not share this medicine with others. What if I miss a dose? If you miss a dose, take it as soon as you can. If it is almost time for your next dose, take only that dose. Do not take double or extra doses. What may interact with this medicine? Do not take this medicine with any of the following medications: -cisapride -dofetilide -dronedarone -linezolid -MAOIs like Carbex, Eldepryl, Marplan, Nardil, and Parnate -methylene blue (injected into a vein) -pimozide -thioridazine This medicine may also interact with the following medications: -alcohol -amphetamines -aspirin and aspirin-like medicines -certain medicines for depression, anxiety, or psychotic disturbances -certain medicines for fungal infections like ketoconazole, fluconazole, posaconazole, and itraconazole -certain medicines for irregular heart beat like flecainide, quinidine, propafenone -certain medicines for migraine headaches like almotriptan, eletriptan, frovatriptan, naratriptan, rizatriptan,  sumatriptan, zolmitriptan -certain medicines for sleep -certain medicines for seizures like carbamazepine, valproic acid, phenytoin -certain medicines that treat or prevent blood clots like warfarin, enoxaparin,  dalteparin -cimetidine -digoxin -diuretics -fentanyl -isoniazid -lithium -NSAIDs, medicines for pain and inflammation, like ibuprofen or naproxen -other medicines that prolong the QT interval (cause an abnormal heart rhythm) -rasagiline -safinamide -supplements like St. John's wort, kava kava, valerian -tolbutamide -tramadol -tryptophan This list may not describe all possible interactions. Give your health care provider a list of all the medicines, herbs, non-prescription drugs, or dietary supplements you use. Also tell them if you smoke, drink alcohol, or use illegal drugs. Some items may interact with your medicine. What should I watch for while using this medicine? Tell your doctor if your symptoms do not get better or if they get worse. Visit your doctor or health care professional for regular checks on your progress. Because it may take several weeks to see the full effects of this medicine, it is important to continue your treatment as prescribed by your doctor. Patients and their families should watch out for new or worsening thoughts of suicide or depression. Also watch out for sudden changes in feelings such as feeling anxious, agitated, panicky, irritable, hostile, aggressive, impulsive, severely restless, overly excited and hyperactive, or not being able to sleep. If this happens, especially at the beginning of treatment or after a change in dose, call your health care professional. Bonita QuinYou may get drowsy or dizzy. Do not drive, use machinery, or do anything that needs mental alertness until you know how this medicine affects you. Do not stand or sit up quickly, especially if you are an older patient. This reduces the risk of dizzy or fainting spells. Alcohol may interfere with the effect of this medicine. Avoid alcoholic drinks. Your mouth may get dry. Chewing sugarless gum or sucking hard candy, and drinking plenty of water may help. Contact your doctor if the problem does not go away or  is severe. What side effects may I notice from receiving this medicine? Side effects that you should report to your doctor or health care professional as soon as possible: -allergic reactions like skin rash, itching or hives, swelling of the face, lips, or tongue -anxious -black, tarry stools -changes in vision -confusion -elevated mood, decreased need for sleep, racing thoughts, impulsive behavior -eye pain -fast, irregular heartbeat -feeling faint or lightheaded, falls -feeling agitated, angry, or irritable -hallucination, loss of contact with reality -loss of balance or coordination -loss of memory -painful or prolonged erections -restlessness, pacing, inability to keep still -seizures -stiff muscles -suicidal thoughts or other mood changes -trouble sleeping -unusual bleeding or bruising -unusually weak or tired -vomiting Side effects that usually do not require medical attention (report to your doctor or health care professional if they continue or are bothersome): -change in appetite or weight -change in sex drive or performance -diarrhea -increased sweating -indigestion, nausea -tremors This list may not describe all possible side effects. Call your doctor for medical advice about side effects. You may report side effects to FDA at 1-800-FDA-1088. Where should I keep my medicine? Keep out of the reach of children. Store at room temperature between 15 and 30 degrees C (59 and 86 degrees F). Throw away any unused medicine after the expiration date. NOTE: This sheet is a summary. It may not cover all possible information. If you have questions about this medicine, talk to your doctor, pharmacist, or health care provider.  2018 Elsevier/Gold Standard (2016-12-13 14:17:49)
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
## 2017-11-25 LAB — CBC
HEMOGLOBIN: 12.4 g/dL (ref 11.1–15.9)
Hematocrit: 37.9 % (ref 34.0–46.6)
MCH: 30 pg (ref 26.6–33.0)
MCHC: 32.7 g/dL (ref 31.5–35.7)
MCV: 92 fL (ref 79–97)
PLATELETS: 273 10*3/uL (ref 150–379)
RBC: 4.13 x10E6/uL (ref 3.77–5.28)
RDW: 13.3 % (ref 12.3–15.4)
WBC: 5.7 10*3/uL (ref 3.4–10.8)

## 2017-11-25 LAB — FERRITIN: Ferritin: 19 ng/mL (ref 15–150)

## 2017-11-25 LAB — TSH: TSH: 1.47 u[IU]/mL (ref 0.450–4.500)

## 2018-01-05 ENCOUNTER — Ambulatory Visit (INDEPENDENT_AMBULATORY_CARE_PROVIDER_SITE_OTHER): Payer: Managed Care, Other (non HMO) | Admitting: Certified Nurse Midwife

## 2018-01-05 ENCOUNTER — Encounter: Payer: Self-pay | Admitting: Certified Nurse Midwife

## 2018-01-05 VITALS — BP 115/80 | HR 70 | Ht 60.0 in | Wt 123.5 lb

## 2018-01-05 DIAGNOSIS — Z09 Encounter for follow-up examination after completed treatment for conditions other than malignant neoplasm: Secondary | ICD-10-CM | POA: Diagnosis not present

## 2018-01-05 DIAGNOSIS — F419 Anxiety disorder, unspecified: Secondary | ICD-10-CM

## 2018-01-05 DIAGNOSIS — F329 Major depressive disorder, single episode, unspecified: Secondary | ICD-10-CM | POA: Diagnosis not present

## 2018-01-05 DIAGNOSIS — F32A Depression, unspecified: Secondary | ICD-10-CM

## 2018-01-05 NOTE — Patient Instructions (Signed)
Hydroxyzine capsules or tablets What is this medicine? HYDROXYZINE (hye [place] i zeen) is an antihistamine. This medicine is used to treat allergy symptoms. It is also used to treat anxiety and tension. This medicine can be used with other medicines to induce sleep before surgery. This medicine may be used for other purposes; ask your health care provider or pharmacist if you have questions. COMMON BRAND NAME(S): ANX, Atarax, Rezine, Vistaril What should I tell my health care provider before I take this medicine? They need to know if you have any of these conditions: -any chronic illness -difficulty passing urine -glaucoma -heart disease -kidney disease -liver disease -lung disease -an unusual or allergic reaction to hydroxyzine, cetirizine, other medicines, foods, dyes, or preservatives -pregnant or trying to get pregnant -breast-feeding How should I use this medicine? Take this medicine by mouth with a full glass of water. Follow the directions on the prescription label. You may take this medicine with food or on an empty stomach. Take your medicine at regular intervals. Do not take your medicine more often than directed. Talk to your pediatrician regarding the use of this medicine in children. Special care may be needed. While this drug may be prescribed for children as young as 75 years of age for selected conditions, precautions do apply. Patients over 62 years old may have a stronger reaction and need a smaller dose. Overdosage: If you think you have taken too much of this medicine contact a poison control center or emergency room at once. NOTE: This medicine is only for you. Do not share this medicine with others. What if I miss a dose? If you miss a dose, take it as soon as you can. If it is almost time for your next dose, take only that dose. Do not take double or extra doses. What may interact with this medicine? -alcohol -barbiturate medicines for sleep or seizures -medicines for  colds, allergies -medicines for depression, anxiety, or emotional disturbances -medicines for pain -medicines for sleep -muscle relaxants This list may not describe all possible interactions. Give your health care provider a list of all the medicines, herbs, non-prescription drugs, or dietary supplements you use. Also tell them if you smoke, drink alcohol, or use illegal drugs. Some items may interact with your medicine. What should I watch for while using this medicine? Tell your doctor or health care professional if your symptoms do not improve. You may get drowsy or dizzy. Do not drive, use machinery, or do anything that needs mental alertness until you know how this medicine affects you. Do not stand or sit up quickly, especially if you are an older patient. This reduces the risk of dizzy or fainting spells. Alcohol may interfere with the effect of this medicine. Avoid alcoholic drinks. Your mouth may get dry. Chewing sugarless gum or sucking hard candy, and drinking plenty of water may help. Contact your doctor if the problem does not go away or is severe. This medicine may cause dry eyes and blurred vision. If you wear contact lenses you may feel some discomfort. Lubricating drops may help. See your eye doctor if the problem does not go away or is severe. If you are receiving skin tests for allergies, tell your doctor you are using this medicine. What side effects may I notice from receiving this medicine? Side effects that you should report to your doctor or health care professional as soon as possible: -fast or irregular heartbeat -difficulty passing urine -seizures -slurred speech or confusion -tremor Side effects that  usually do not require medical attention (report to your doctor or health care professional if they continue or are bothersome): -constipation -drowsiness -fatigue -headache -stomach upset This list may not describe all possible side effects. Call your doctor for  medical advice about side effects. You may report side effects to FDA at 1-800-FDA-1088. Where should I keep my medicine? Keep out of the reach of children. Store at room temperature between 15 and 30 degrees C (59 and 86 degrees F). Keep container tightly closed. Throw away any unused medicine after the expiration date. NOTE: This sheet is a summary. It may not cover all possible information. If you have questions about this medicine, talk to your doctor, pharmacist, or health care provider.  2018 Elsevier/Gold Standard (2008-04-22 14:50:59) Sertraline tablets What is this medicine? SERTRALINE (SER tra leen) is used to treat depression. It may also be used to treat obsessive compulsive disorder, panic disorder, post-trauma stress, premenstrual dysphoric disorder (PMDD) or social anxiety. This medicine may be used for other purposes; ask your health care provider or pharmacist if you have questions. COMMON BRAND NAME(S): Zoloft What should I tell my health care provider before I take this medicine? They need to know if you have any of these conditions: -bleeding disorders -bipolar disorder or a family history of bipolar disorder -glaucoma -heart disease -high blood pressure -history of irregular heartbeat -history of low levels of calcium, magnesium, or potassium in the blood -if you often drink alcohol -liver disease -receiving electroconvulsive therapy -seizures -suicidal thoughts, plans, or attempt; a previous suicide attempt by you or a family member -take medicines that treat or prevent blood clots -thyroid disease -an unusual or allergic reaction to sertraline, other medicines, foods, dyes, or preservatives -pregnant or trying to get pregnant -breast-feeding How should I use this medicine? Take this medicine by mouth with a glass of water. Follow the directions on the prescription label. You can take it with or without food. Take your medicine at regular intervals. Do not take  your medicine more often than directed. Do not stop taking this medicine suddenly except upon the advice of your doctor. Stopping this medicine too quickly may cause serious side effects or your condition may worsen. A special MedGuide will be given to you by the pharmacist with each prescription and refill. Be sure to read this information carefully each time. Talk to your pediatrician regarding the use of this medicine in children. While this drug may be prescribed for children as young as 7 years for selected conditions, precautions do apply. Overdosage: If you think you have taken too much of this medicine contact a poison control center or emergency room at once. NOTE: This medicine is only for you. Do not share this medicine with others. What if I miss a dose? If you miss a dose, take it as soon as you can. If it is almost time for your next dose, take only that dose. Do not take double or extra doses. What may interact with this medicine? Do not take this medicine with any of the following medications: -cisapride -dofetilide -dronedarone -linezolid -MAOIs like Carbex, Eldepryl, Marplan, Nardil, and Parnate -methylene blue (injected into a vein) -pimozide -thioridazine This medicine may also interact with the following medications: -alcohol -amphetamines -aspirin and aspirin-like medicines -certain medicines for depression, anxiety, or psychotic disturbances -certain medicines for fungal infections like ketoconazole, fluconazole, posaconazole, and itraconazole -certain medicines for irregular heart beat like flecainide, quinidine, propafenone -certain medicines for migraine headaches like almotriptan, eletriptan, frovatriptan, naratriptan, rizatriptan,  sumatriptan, zolmitriptan -certain medicines for sleep -certain medicines for seizures like carbamazepine, valproic acid, phenytoin -certain medicines that treat or prevent blood clots like warfarin, enoxaparin,  dalteparin -cimetidine -digoxin -diuretics -fentanyl -isoniazid -lithium -NSAIDs, medicines for pain and inflammation, like ibuprofen or naproxen -other medicines that prolong the QT interval (cause an abnormal heart rhythm) -rasagiline -safinamide -supplements like St. John's wort, kava kava, valerian -tolbutamide -tramadol -tryptophan This list may not describe all possible interactions. Give your health care provider a list of all the medicines, herbs, non-prescription drugs, or dietary supplements you use. Also tell them if you smoke, drink alcohol, or use illegal drugs. Some items may interact with your medicine. What should I watch for while using this medicine? Tell your doctor if your symptoms do not get better or if they get worse. Visit your doctor or health care professional for regular checks on your progress. Because it may take several weeks to see the full effects of this medicine, it is important to continue your treatment as prescribed by your doctor. Patients and their families should watch out for new or worsening thoughts of suicide or depression. Also watch out for sudden changes in feelings such as feeling anxious, agitated, panicky, irritable, hostile, aggressive, impulsive, severely restless, overly excited and hyperactive, or not being able to sleep. If this happens, especially at the beginning of treatment or after a change in dose, call your health care professional. You may get drowsy or dizzy. Do not drive, use machinery, or do anything that needs mental alertness until you know how this medicine affects you. Do not stand or sit up quickly, especially if you are an older patient. This reduces the risk of dizzy or fainting spells. Alcohol may interfere with the effect of this medicine. Avoid alcoholic drinks. Your mouth may get dry. Chewing sugarless gum or sucking hard candy, and drinking plenty of water may help. Contact your doctor if the problem does not go away or  is severe. What side effects may I notice from receiving this medicine? Side effects that you should report to your doctor or health care professional as soon as possible: -allergic reactions like skin rash, itching or hives, swelling of the face, lips, or tongue -anxious -black, tarry stools -changes in vision -confusion -elevated mood, decreased need for sleep, racing thoughts, impulsive behavior -eye pain -fast, irregular heartbeat -feeling faint or lightheaded, falls -feeling agitated, angry, or irritable -hallucination, loss of contact with reality -loss of balance or coordination -loss of memory -painful or prolonged erections -restlessness, pacing, inability to keep still -seizures -stiff muscles -suicidal thoughts or other mood changes -trouble sleeping -unusual bleeding or bruising -unusually weak or tired -vomiting Side effects that usually do not require medical attention (report to your doctor or health care professional if they continue or are bothersome): -change in appetite or weight -change in sex drive or performance -diarrhea -increased sweating -indigestion, nausea -tremors This list may not describe all possible side effects. Call your doctor for medical advice about side effects. You may report side effects to FDA at 1-800-FDA-1088. Where should I keep my medicine? Keep out of the reach of children. Store at room temperature between 15 and 30 degrees C (59 and 86 degrees F). Throw away any unused medicine after the expiration date. NOTE: This sheet is a summary. It may not cover all possible information. If you have questions about this medicine, talk to your doctor, pharmacist, or health care provider.  2018 Elsevier/Gold Standard (2016-12-13 14:17:49)  

## 2018-01-06 NOTE — Progress Notes (Signed)
GYN ENCOUNTER NOTE  Subjective:       Dana Hood is a 22 y.o. G0P0000 female here for medication follow up. Started Zoloft and hydroxyzine for anxiety and depression on 11/24/2017.  No enrolled in counseling at this time, but feeling much better. Just started classes at [employer] for [employer]. Husband will be coming home to visit in March.   Denies difficulty breathing or respiratory distress, chest pain, abdominal pain, vaginal bleeding, dysuria, and leg pain or swelling.   No suicidal or homicidal thoughts.    Gynecologic History  Patient's last menstrual period was 12/18/2017.  Contraception: NuvaRing vaginal inserts.  Last Pap: 02/2017. Results were: normal.  Obstetric History  OB History  Gravida Para Term Preterm AB Living  0 0 0 0 0 0  SAB TAB Ectopic Multiple Live Births  0 0 0 0 0        Past Medical History:  Diagnosis Date  . Anxiety     Past Surgical History:  Procedure Laterality Date  . NO PAST SURGERIES      Current Outpatient Medications on File Prior to Visit  Medication Sig Dispense Refill  . hydrOXYzine (ATARAX/VISTARIL) 25 MG tablet Take 1 tablet (25 mg total) by mouth every 6 (six) hours as needed for anxiety. 30 tablet 2  . NUVARING 0.12-0.015 MG/24HR vaginal ring PLACE 1 RING VAGINALLY MONTHLY. LEAVE IN PLACE FOR 3 CONSECUTIVE WEEKS, THEN REMOVE FOR 1 WEEK.  12  . sertraline (ZOLOFT) 100 MG tablet Take 1 tablet (100 mg total) by mouth daily. 30 tablet 1   No current facility-administered medications on file prior to visit.     No Known Allergies  Social History   Socioeconomic History  . Marital status: Married    Spouse name: Not on file  . Number of children: Not on file  . Years of education: Not on file  . Highest education level: Not on file  Social Needs  . Financial resource strain: Not on file  . Food insecurity - worry: Not on file  . Food insecurity - inability: Not on file  . Transportation needs -  medical: Not on file  . Transportation needs - non-medical: Not on file  Occupational History  . Not on file  Tobacco Use  . Smoking status: Never Smoker  . Smokeless tobacco: Never Used  Substance and Sexual Activity  . Alcohol use: No  . Drug use: No  . Sexual activity: Not Currently    Partners: Male    Birth control/protection: Inserts    Comment: nuvaring  Other Topics Concern  . Not on file  Social History Narrative  . Not on file    Family History  Problem Relation Age of Onset  . Hyperlipidemia Mother   . Hypertension Mother   . Migraines Mother   . Heart failure Maternal Grandfather   . Rheum arthritis Maternal Uncle     The following portions of the patient's history were reviewed and updated as appropriate: allergies, current medications, past family history, past medical history, past social history, past surgical history and problem list.  Review of Systems  Review of Systems - Negative except as noted above. History obtained from the patient.   Objective:   BP 115/80 (BP Location: Right Arm, Patient Position: Sitting, Cuff Size: Normal)   Pulse 70   Ht 5' (1.524 m)   Wt 123 lb 8 oz (56 kg)   LMP 12/18/2017   BMI 24.12 kg/m   CONSTITUTIONAL: Well-developed, well-nourished  female in no acute distress.   NEUROLGIC: Alert and oriented to person, place, and time.   PSYCHIATRIC: Normal mood and affect. Normal behavior. Normal judgment and thought content.  Depression screen PHQ 2/9 01/05/2018  Decreased Interest 1  Down, Depressed, Hopeless 0  PHQ - 2 Score 1  Altered sleeping 2  Tired, decreased energy 2  Change in appetite 1  Feeling bad or failure about yourself  0  Trouble concentrating 1  Moving slowly or fidgety/restless 2  Suicidal thoughts 0  PHQ-9 Score 9  Difficult doing work/chores Somewhat difficult   Assessment:   1. Anxiety and depression  2. Follow up  Plan:   Continue Zoloft and Hydroxyzine as ordered.   Reviewed red  flag symptoms and when to call.   RTC as previously scheduled or sooner if needed.    Gunnar Bulla, CNM [facility], [facility]

## 2018-01-25 ENCOUNTER — Other Ambulatory Visit: Payer: Self-pay | Admitting: Certified Nurse Midwife

## 2018-02-09 ENCOUNTER — Other Ambulatory Visit: Payer: Self-pay | Admitting: Certified Nurse Midwife

## 2018-03-10 ENCOUNTER — Telehealth: Payer: Self-pay | Admitting: Certified Nurse Midwife

## 2018-03-10 NOTE — Telephone Encounter (Signed)
The patient called wanting to speak with a nurse in regards to her having a screening done at her next appointment. Please advise.

## 2018-03-11 NOTE — Telephone Encounter (Signed)
vm not set up. Will try later.

## 2018-03-12 NOTE — Telephone Encounter (Signed)
vm not set up yet

## 2018-03-19 ENCOUNTER — Encounter: Payer: Self-pay | Admitting: Certified Nurse Midwife

## 2018-03-19 ENCOUNTER — Ambulatory Visit (INDEPENDENT_AMBULATORY_CARE_PROVIDER_SITE_OTHER): Payer: Managed Care, Other (non HMO) | Admitting: Certified Nurse Midwife

## 2018-03-19 VITALS — BP 125/77 | HR 91 | Ht 60.0 in | Wt 121.4 lb

## 2018-03-19 DIAGNOSIS — Z1331 Encounter for screening for depression: Secondary | ICD-10-CM | POA: Diagnosis not present

## 2018-03-19 DIAGNOSIS — Z01419 Encounter for gynecological examination (general) (routine) without abnormal findings: Secondary | ICD-10-CM | POA: Diagnosis not present

## 2018-03-19 MED ORDER — NUVARING 0.12-0.015 MG/24HR VA RING: VAGINAL_RING | VAGINAL | 12 refills | Status: DC

## 2018-03-19 MED ORDER — HYDROXYZINE HCL 25 MG PO TABS: 25.0000 mg | ORAL_TABLET | Freq: Four times a day (QID) | ORAL | 2 refills | Status: DC | PRN

## 2018-03-19 NOTE — Patient Instructions (Signed)
Hydroxyzine capsules or tablets What is this medicine? HYDROXYZINE (hye [place] i zeen) is an antihistamine. This medicine is used to treat allergy symptoms. It is also used to treat anxiety and tension. This medicine can be used with other medicines to induce sleep before surgery. This medicine may be used for other purposes; ask your health care provider or pharmacist if you have questions. COMMON BRAND NAME(S): ANX, Atarax, Rezine, Vistaril What should I tell my health care provider before I take this medicine? They need to know if you have any of these conditions: -any chronic illness -difficulty passing urine -glaucoma -heart disease -kidney disease -liver disease -lung disease -an unusual or allergic reaction to hydroxyzine, cetirizine, other medicines, foods, dyes, or preservatives -pregnant or trying to get pregnant -breast-feeding How should I use this medicine? Take this medicine by mouth with a full glass of water. Follow the directions on the prescription label. You may take this medicine with food or on an empty stomach. Take your medicine at regular intervals. Do not take your medicine more often than directed. Talk to your pediatrician regarding the use of this medicine in children. Special care may be needed. While this drug may be prescribed for children as young as 102 years of age for selected conditions, precautions do apply. Patients over 11 years old may have a stronger reaction and need a smaller dose. Overdosage: If you think you have taken too much of this medicine contact a poison control center or emergency room at once. NOTE: This medicine is only for you. Do not share this medicine with others. What if I miss a dose? If you miss a dose, take it as soon as you can. If it is almost time for your next dose, take only that dose. Do not take double or extra doses. What may interact with this medicine? -alcohol -barbiturate medicines for sleep or seizures -medicines for  colds, allergies -medicines for depression, anxiety, or emotional disturbances -medicines for pain -medicines for sleep -muscle relaxants This list may not describe all possible interactions. Give your health care provider a list of all the medicines, herbs, non-prescription drugs, or dietary supplements you use. Also tell them if you smoke, drink alcohol, or use illegal drugs. Some items may interact with your medicine. What should I watch for while using this medicine? Tell your doctor or health care professional if your symptoms do not improve. You may get drowsy or dizzy. Do not drive, use machinery, or do anything that needs mental alertness until you know how this medicine affects you. Do not stand or sit up quickly, especially if you are an older patient. This reduces the risk of dizzy or fainting spells. Alcohol may interfere with the effect of this medicine. Avoid alcoholic drinks. Your mouth may get dry. Chewing sugarless gum or sucking hard candy, and drinking plenty of water may help. Contact your doctor if the problem does not go away or is severe. This medicine may cause dry eyes and blurred vision. If you wear contact lenses you may feel some discomfort. Lubricating drops may help. See your eye doctor if the problem does not go away or is severe. If you are receiving skin tests for allergies, tell your doctor you are using this medicine. What side effects may I notice from receiving this medicine? Side effects that you should report to your doctor or health care professional as soon as possible: -fast or irregular heartbeat -difficulty passing urine -seizures -slurred speech or confusion -tremor Side effects that  usually do not require medical attention (report to your doctor or health care professional if they continue or are bothersome): -constipation -drowsiness -fatigue -headache -stomach upset This list may not describe all possible side effects. Call your doctor for  medical advice about side effects. You may report side effects to FDA at 1-800-FDA-1088. Where should I keep my medicine? Keep out of the reach of children. Store at room temperature between 15 and 30 degrees C (59 and 86 degrees F). Keep container tightly closed. Throw away any unused medicine after the expiration date. NOTE: This sheet is a summary. It may not cover all possible information. If you have questions about this medicine, talk to your doctor, pharmacist, or health care provider.  2018 Elsevier/Gold Standard (2008-04-22 14:50:59) Ethinyl Estradiol; Etonogestrel vaginal ring What is this medicine? ETHINYL ESTRADIOL; ETONOGESTREL (ETH in il es tra DYE ole; et oh noe JES trel) vaginal ring is a flexible, vaginal ring used as a contraceptive (birth control method). This medicine combines two types of female hormones, an estrogen and a progestin. This ring is used to prevent ovulation and pregnancy. Each ring is effective for one month. This medicine may be used for other purposes; ask your health care provider or pharmacist if you have questions. COMMON BRAND NAME(S): NuvaRing What should I tell my health care provider before I take this medicine? They need to know if you have or ever had any of these conditions: -abnormal vaginal bleeding -blood vessel disease or blood clots -breast, cervical, endometrial, ovarian, liver, or uterine cancer -diabetes -gallbladder disease -heart disease or recent heart attack -high blood pressure -high cholesterol -kidney disease -liver disease -migraine headaches -stroke -systemic lupus erythematosus (SLE) -tobacco smoker -an unusual or allergic reaction to estrogens, progestins, other medicines, foods, dyes, or preservatives -pregnant or trying to get pregnant -breast-feeding How should I use this medicine? Insert the ring into your vagina as directed. Follow the directions on the prescription label. The ring will remain place for 3 weeks  and is then removed for a 1-week break. A new ring is inserted 1 week after the last ring was removed, on the same day of the week. Check often to make sure the ring is still in place, especially before and after sexual intercourse. If the ring was out of the vagina for an unknown amount of time, you may not be protected from pregnancy. Perform a pregnancy test and call your doctor. Do not use more often than directed. A patient package insert for the product will be given with each prescription and refill. Read this sheet carefully each time. The sheet may change frequently. Contact your pediatrician regarding the use of this medicine in children. Special care may be needed. This medicine has been used in female children who have started having menstrual periods. Overdosage: If you think you have taken too much of this medicine contact a poison control center or emergency room at once. NOTE: This medicine is only for you. Do not share this medicine with others. What if I miss a dose? You will need to replace your vaginal ring once a month as directed. If the ring should slip out, or if you leave it in longer or shorter than you should, contact your health care professional for advice. What may interact with this medicine? Do not take this medicine with the following medication: -dasabuvir; ombitasvir; paritaprevir; ritonavir -ombitasvir; paritaprevir; ritonavir This medicine may also interact with the following medications: -acetaminophen -antibiotics or medicines for infections, especially rifampin, rifabutin, rifapentine,  and griseofulvin, and possibly penicillins or tetracyclines -aprepitant -ascorbic acid (vitamin C) -atorvastatin -barbiturate medicines, such as phenobarbital -bosentan -carbamazepine -caffeine -clofibrate -cyclosporine -dantrolene -doxercalciferol -felbamate -grapefruit juice -hydrocortisone -medicines for anxiety or sleeping problems, such as diazepam or  temazepam -medicines for diabetes, including pioglitazone -modafinil -mycophenolate -nefazodone -oxcarbazepine -phenytoin -prednisolone -ritonavir or other medicines for HIV infection or AIDS -rosuvastatin -selegiline -soy isoflavones supplements -St. John's wort -tamoxifen or raloxifene -theophylline -thyroid hormones -topiramate -warfarin This list may not describe all possible interactions. Give your health care provider a list of all the medicines, herbs, non-prescription drugs, or dietary supplements you use. Also tell them if you smoke, drink alcohol, or use illegal drugs. Some items may interact with your medicine. What should I watch for while using this medicine? Visit your doctor or health care professional for regular checks on your progress. You will need a regular breast and pelvic exam and Pap smear while on this medicine. Use an additional method of contraception during the first cycle that you use this ring. Do not use a diaphragm or female condom, as the ring can interfere with these birth control methods and their proper placement. If you have any reason to think you are pregnant, stop using this medicine right away and contact your doctor or health care professional. If you are using this medicine for hormone related problems, it may take several cycles of use to see improvement in your condition. Smoking increases the risk of getting a blood clot or having a stroke while you are using hormonal birth control, especially if you are more than 23 years old. You are strongly advised not to smoke. This medicine can make your body retain fluid, making your fingers, hands, or ankles swell. Your blood pressure can go up. Contact your doctor or health care professional if you feel you are retaining fluid. This medicine can make you more sensitive to the sun. Keep out of the sun. If you cannot avoid being in the sun, wear protective clothing and use sunscreen. Do not use sun lamps  or tanning beds/booths. If you wear contact lenses and notice visual changes, or if the lenses begin to feel uncomfortable, consult your eye care specialist. In some women, tenderness, swelling, or minor bleeding of the gums may occur. Notify your dentist if this happens. Brushing and flossing your teeth regularly may help limit this. See your dentist regularly and inform your dentist of the medicines you are taking. If you are going to have elective surgery, you may need to stop using this medicine before the surgery. Consult your health care professional for advice. This medicine does not protect you against HIV infection (AIDS) or any other sexually transmitted diseases. What side effects may I notice from receiving this medicine? Side effects that you should report to your doctor or health care professional as soon as possible: -breast tissue changes or discharge -changes in vaginal bleeding during your period or between your periods -chest pain -coughing up blood -dizziness or fainting spells -headaches or migraines -leg, arm or groin pain -severe or sudden headaches -stomach pain (severe) -sudden shortness of breath -sudden loss of coordination, especially on one side of the body -speech problems -symptoms of vaginal infection like itching, irritation or unusual discharge -tenderness in the upper abdomen -vomiting -weakness or numbness in the arms or legs, especially on one side of the body -yellowing of the eyes or skin Side effects that usually do not require medical attention (report to your doctor or health care professional if  they continue or are bothersome): -breakthrough bleeding and spotting that continues beyond the 3 initial cycles of pills -breast tenderness -mood changes, anxiety, depression, frustration, anger, or emotional outbursts -increased sensitivity to sun or ultraviolet light -nausea -skin rash, acne, or brown spots on the skin -weight gain (slight) This  list may not describe all possible side effects. Call your doctor for medical advice about side effects. You may report side effects to FDA at 1-800-FDA-1088. Where should I keep my medicine? Keep out of the reach of children. Store at room temperature between 15 and 30 degrees C (59 and 86 degrees F) for up to 4 months. The product will expire after 4 months. Protect from light. Throw away any unused medicine after the expiration date. NOTE: This sheet is a summary. It may not cover all possible information. If you have questions about this medicine, talk to your doctor, pharmacist, or health care provider.  2018 Elsevier/Gold Standard (2016-08-16 17:00:31) Preventive Care 18-39 Years, Female Preventive care refers to lifestyle choices and visits with your health care provider that can promote health and wellness. What does preventive care include?  A yearly physical exam. This is also called an annual well check.  Dental exams once or twice a year.  Routine eye exams. Ask your health care provider how often you should have your eyes checked.  Personal lifestyle choices, including: ? Daily care of your teeth and gums. ? Regular physical activity. ? Eating a healthy diet. ? Avoiding tobacco and drug use. ? Limiting alcohol use. ? Practicing safe sex. ? Taking vitamin and mineral supplements as recommended by your health care provider. What happens during an annual well check? The services and screenings done by your health care provider during your annual well check will depend on your age, overall health, lifestyle risk factors, and family history of disease. Counseling Your health care provider may ask you questions about your:  Alcohol use.  Tobacco use.  Drug use.  Emotional well-being.  Home and relationship well-being.  Sexual activity.  Eating habits.  Work and work Statistician.  Method of birth control.  Menstrual cycle.  Pregnancy history.  Screening You  may have the following tests or measurements:  Height, weight, and BMI.  Diabetes screening. This is done by checking your blood sugar (glucose) after you have not eaten for a while (fasting).  Blood pressure.  Lipid and cholesterol levels. These may be checked every 5 years starting at age 51.  Skin check.  Hepatitis C blood test.  Hepatitis B blood test.  Sexually transmitted disease (STD) testing.  BRCA-related cancer screening. This may be done if you have a family history of breast, ovarian, tubal, or peritoneal cancers.  Pelvic exam and Pap test. This may be done every 3 years starting at age 31. Starting at age 49, this may be done every 5 years if you have a Pap test in combination with an HPV test.  Discuss your test results, treatment options, and if necessary, the need for more tests with your health care provider. Vaccines Your health care provider may recommend certain vaccines, such as:  Influenza vaccine. This is recommended every year.  Tetanus, diphtheria, and acellular pertussis (Tdap, Td) vaccine. You may need a Td booster every 10 years.  Varicella vaccine. You may need this if you have not been vaccinated.  HPV vaccine. If you are 57 or younger, you may need three doses over 6 months.  Measles, mumps, and rubella (MMR) vaccine. You may need  at least one dose of MMR. You may also need a second dose.  Pneumococcal 13-valent conjugate (PCV13) vaccine. You may need this if you have certain conditions and were not previously vaccinated.  Pneumococcal polysaccharide (PPSV23) vaccine. You may need one or two doses if you smoke cigarettes or if you have certain conditions.  Meningococcal vaccine. One dose is recommended if you are age 63-21 years and a first-year college student living in a residence hall, or if you have one of several medical conditions. You may also need additional booster doses.  Hepatitis A vaccine. You may need this if you have certain  conditions or if you travel or work in places where you may be exposed to hepatitis A.  Hepatitis B vaccine. You may need this if you have certain conditions or if you travel or work in places where you may be exposed to hepatitis B.  Haemophilus influenzae type b (Hib) vaccine. You may need this if you have certain risk factors.  Talk to your health care provider about which screenings and vaccines you need and how often you need them. This information is not intended to replace advice given to you by your health care provider. Make sure you discuss any questions you have with your health care provider. Document Released: 02/04/2002 Document Revised: 08/28/2016 Document Reviewed: 10/10/2015 Elsevier Interactive Patient Education  [employer].

## 2018-03-19 NOTE — Progress Notes (Signed)
ANNUAL PREVENTATIVE CARE GYN  ENCOUNTER NOTE  Subjective:       Dana Hood is a 22 y.o. G0P0000 female here for a routine annual gynecologic exam.  Current complaints: 1. Needs NuvaRing refill 2. Needs Atarax refill 3. Needs yearly labs for work  Reports increased moods taking Zoloft daily. Husband still overseas, but will come back in July 2019 to visit.   Denies difficulty breathing or respiratory distress, chest pain, abdominal pain, excessive vaginal bleeding, dysuria, and leg pain or swelling.    Gynecologic History  Patient's last menstrual period was 03/19/2018.  Contraception: NuvaRing vaginal inserts  Last Pap: 02/2017. Results were: normal  Obstetric History  OB History  Gravida Para Term Preterm AB Living  0 0 0 0 0 0  SAB TAB Ectopic Multiple Live Births  0 0 0 0 0    Past Medical History:  Diagnosis Date  . Anxiety     Past Surgical History:  Procedure Laterality Date  . NO PAST SURGERIES      Current Outpatient Medications on File Prior to Visit  Medication Sig Dispense Refill  . sertraline (ZOLOFT) 100 MG tablet Take 1 tablet (100 mg total) by mouth daily. 30 tablet 1   No current facility-administered medications on file prior to visit.     No Known Allergies  Social History   Socioeconomic History  . Marital status: Married    Spouse name: Not on file  . Number of children: Not on file  . Years of education: Not on file  . Highest education level: Not on file  Occupational History  . Not on file  Social Needs  . Financial resource strain: Not on file  . Food insecurity:    Worry: Not on file    Inability: Not on file  . Transportation needs:    Medical: Not on file    Non-medical: Not on file  Tobacco Use  . Smoking status: Never Smoker  . Smokeless tobacco: Never Used  Substance and Sexual Activity  . Alcohol use: No  . Drug use: No  . Sexual activity: Not Currently    Partners: Male    Birth control/protection:  Inserts    Comment: nuvaring  Lifestyle  . Physical activity:    Days per week: Not on file    Minutes per session: Not on file  . Stress: Not on file  Relationships  . Social connections:    Talks on phone: Not on file    Gets together: Not on file    Attends religious service: Not on file    Active member of club or organization: Not on file    Attends meetings of clubs or organizations: Not on file    Relationship status: Not on file  . Intimate partner violence:    Fear of current or ex partner: Not on file    Emotionally abused: Not on file    Physically abused: Not on file    Forced sexual activity: Not on file  Other Topics Concern  . Not on file  Social History Narrative  . Not on file    Family History  Problem Relation Age of Onset  . Hyperlipidemia Mother   . Hypertension Mother   . Migraines Mother   . Heart failure Maternal Grandfather   . Rheum arthritis Maternal Uncle     The following portions of the patient's history were reviewed and updated as appropriate: allergies, current medications, past family history, past medical history, past  social history, past surgical history and problem list.  Review of Systems  ROS negative except as noted above. Information obtained from patient.    Objective:   BP 125/77   Pulse 91   Ht 5' (1.524 m)   Wt 121 lb 6 oz (55.1 kg)   BMI 23.70 kg/m    CONSTITUTIONAL: Well-developed, well-nourished female in no acute distress.   PSYCHIATRIC: Normal mood and affect. Normal behavior. Normal judgment and thought content.  NEUROLGIC: Alert and oriented to person, place, and time. Normal muscle tone coordination. No cranial nerve deficit noted.  HENT:  Normocephalic, atraumatic, External right and left ear normal. Oropharynx is clear and moist  EYES: Conjunctivae and EOM are normal. Pupils are equal and round. No scleral icterus.   NECK: Normal range of motion, supple, no masses.  Normal thyroid.   SKIN: Skin is  warm and dry. No rash noted. Not diaphoretic. No erythema. No pallor.  CARDIOVASCULAR: Normal heart rate noted, regular rhythm, no murmur.  RESPIRATORY: Clear to auscultation bilaterally. Effort and breath sounds normal, no problems with respiration noted.  BREASTS: Symmetric in size. No masses, skin changes, nipple drainage, or lymphadenopathy. Bilateral nipple rings present.  ABDOMEN: Soft, normal bowel sounds, no distention noted.  No tenderness, rebound or guarding.   PELVIC:  External Genitalia: Normal  Vagina: Normal  Cervix: Normal  Uterus: Normal  Adnexa: Normal   MUSCULOSKELETAL: Normal range of motion. No tenderness. No cyanosis, clubbing, or edema.  2+ distal pulses.  LYMPHATIC: No Axillary, Supraclavicular, or Inguinal Adenopathy.  Dep[facility] screen [facility] 03/19/2018  Decreased Interest 0  Down, Depressed, Hopeless 1  PHQ - 2 Score 1  Altered sleeping 0  Tired, decreased energy 1  Change in appetite 2  Feeling bad or failure about yourself  1  Trouble concentrating 1  Moving slowly or fidgety/restless 0  Suicidal thoughts 0  PHQ-9 Score 6  Difficult doing work/chores -    Assessment:   Annual gynecologic examination 22 y.o.   Contraception: NuvaRing vaginal inserts   Normal BMI   Problem List Items Addressed This Visit    None    Visit Diagnoses    Encounter for well woman exam    -  Primary   Depression screening          Plan:   Pap: Not needed.  Labs: See orders. Patient will make appointment for fasting labs.  Routine preventative health maintenance measures emphasized: Exercise/Diet/Weight control, Alcohol/Substance use risks and Stress Management.  Reviewed red flag symptoms and when to call.  RTC x 6 months for medication check.   RTC x 1 year for Annual exam or sooner if needed.   Gunnar BullaJenkins Michelle Orion Mole, CNM [facility], [facility]

## 2018-03-19 NOTE — Progress Notes (Signed)
Pt is here for a yearly exam. Screening 6

## 2018-03-24 ENCOUNTER — Encounter: Payer: Self-pay | Admitting: Certified Nurse Midwife

## 2018-03-24 ENCOUNTER — Other Ambulatory Visit: Payer: Self-pay | Admitting: Certified Nurse Midwife

## 2018-03-24 DIAGNOSIS — Z01419 Encounter for gynecological examination (general) (routine) without abnormal findings: Secondary | ICD-10-CM

## 2018-03-27 ENCOUNTER — Other Ambulatory Visit: Payer: Self-pay | Admitting: Certified Nurse Midwife

## 2018-03-27 ENCOUNTER — Other Ambulatory Visit: Payer: Managed Care, Other (non HMO)

## 2018-03-27 ENCOUNTER — Other Ambulatory Visit: Payer: Self-pay

## 2018-03-27 DIAGNOSIS — Z01419 Encounter for gynecological examination (general) (routine) without abnormal findings: Secondary | ICD-10-CM

## 2018-03-27 NOTE — Progress Notes (Signed)
a 

## 2018-03-28 LAB — LIPID PANEL
CHOLESTEROL TOTAL: 184 mg/dL (ref 100–199)
Chol/HDL Ratio: 2.5 ratio (ref 0.0–4.4)
HDL: 74 mg/dL (ref >39)
LDL Calculated: 91 mg/dL (ref 0–99)
Triglycerides: 95 mg/dL (ref 0–149)
VLDL Cholesterol Cal: 19 mg/dL (ref 5–40)

## 2018-03-28 LAB — GLUCOSE, RANDOM: GLUCOSE: 78 mg/dL (ref 65–99)

## 2018-05-08 ENCOUNTER — Encounter: Payer: Self-pay | Admitting: Certified Nurse Midwife

## 2018-05-12 ENCOUNTER — Telehealth: Payer: Self-pay

## 2018-05-12 ENCOUNTER — Telehealth: Payer: Self-pay | Admitting: Certified Nurse Midwife

## 2018-05-12 NOTE — Telephone Encounter (Signed)
Will send to SS. I believe she worked [employer] at this pts last visit.

## 2018-05-12 NOTE — Telephone Encounter (Signed)
The patient called and stated that she needs a copy of the form that Marcelino Duster filled out and had sent to her insurance, (Wellness screening form) The patient stated that her insurance has not received it yet. The patient would like a call back for clarification as well in regards to her MyChart Message. Please advise.

## 2018-05-12 NOTE — Telephone Encounter (Signed)
Unable to leave message due to mailbox not being set up. Asked ML about this form and she states she gave it back to the pt to measure her waist. Printed form ,filled out and faxed with confirmation. Will send mychart message.

## 2018-07-02 ENCOUNTER — Other Ambulatory Visit: Payer: Self-pay

## 2018-07-02 ENCOUNTER — Encounter: Payer: Self-pay | Admitting: Certified Nurse Midwife

## 2018-07-02 MED ORDER — SERTRALINE HCL 100 MG PO TABS: 100.0000 mg | ORAL_TABLET | Freq: Every day | ORAL | 2 refills | Status: DC

## 2018-07-02 NOTE — Telephone Encounter (Signed)
Refill sent. Mychart message sent.

## 2018-08-10 ENCOUNTER — Ambulatory Visit: Payer: Self-pay | Admitting: Family Medicine

## 2018-08-10 VITALS — BP 125/73 | HR 70 | Temp 98.3°F | Resp 16

## 2018-08-10 DIAGNOSIS — H1032 Unspecified acute conjunctivitis, left eye: Secondary | ICD-10-CM

## 2018-08-10 NOTE — Progress Notes (Signed)
Subjective: left eye irritation    Dana Hood is a 22 y.o. female who presents for evaluation of "my left eye is pink". She has noticed the above symptoms in the left eye for a few hours. Onset was this morning when she woke up . Symptoms have included waking up with a small amount of dried crust on her left eyelids, erythema, and irritation.  Denies any drainage or discharge from her left eye since waking up this morning.  Patient denies blurred vision, pain, photophobia, tearing and visual field deficit.  Denies any history of allergic rhinitis.  Patient reports vacuuming yesterday and is unsure if dust might of gotten in her eye.  Denies contact with any chemicals or trauma. Denies any known contacts with similar symptoms.  Denies any history of conjunctivitis.  Denies fever, chills, malaise, fatigue, nasal congestion/rhinorrhea, cough, sore throat, headache, nausea, vomiting, dizziness, AMS, or any other symptoms at all. Contact lens wear: Negative. Occular history: Patient denies any. Patient reports that her 1872-month-old son attends daycare and that she had an event within the last 24 hours where she was in contact with a large number of young kids.  Review of Systems Pertinent items noted in HPI and remainder of comprehensive ROS otherwise negative.     Objective:    BP 125/73 (BP Location: Left Arm, Patient Position: Sitting, Cuff Size: Normal)   Pulse 70   Temp 98.3 F (36.8 C) (Oral)   Resp 16   SpO2 97%            General: alert, cooperative, appears stated age and no distress  Eyes:  Negative findings: lids and lashes normal.  Right conjunctivae and sclerae normal. Pupils equal, round, reactive to light and accomodation.  Visual fields full to confrontation.  Fundi benign.  EOM intact, patient denies pain with EOM.  No periorbital/orbital edema or erythema.  No preauricular lymphadenopathy.  No foreign body noted. No drainage or discharge noted. Right eye exam totally  normal. Positive findings: Left lateral conjunctiva: 1+ injection.    Vision: Uncorrected:            L  20/15            R 20/15    Both: 20/15         Assessment:    Acute conjunctivitis    Plan:   Likely viral conjunctivitis, but symptoms just began a few hours ago.  Discussed the diagnosis and proper care of conjunctivitis.   Stressed household Presenter, broadcastinghygiene. Discussed OTC treatment options. Local eye care discussed.  Cold compresses and OTC chilled artificial tears recommended for symptomatic relief.   Red flag symptoms and indications to return to care discussed.  Transmission reduction discussed.

## 2018-09-15 ENCOUNTER — Ambulatory Visit (INDEPENDENT_AMBULATORY_CARE_PROVIDER_SITE_OTHER): Payer: Managed Care, Other (non HMO) | Admitting: Certified Nurse Midwife

## 2018-09-15 VITALS — BP 118/74 | HR 79 | Ht 60.0 in | Wt 126.3 lb

## 2018-09-15 DIAGNOSIS — F329 Major depressive disorder, single episode, unspecified: Secondary | ICD-10-CM | POA: Diagnosis not present

## 2018-09-15 DIAGNOSIS — F419 Anxiety disorder, unspecified: Secondary | ICD-10-CM

## 2018-09-15 MED ORDER — HYDROXYZINE HCL 25 MG PO TABS: 25.0000 mg | ORAL_TABLET | Freq: Four times a day (QID) | ORAL | 2 refills | Status: DC | PRN

## 2018-09-15 MED ORDER — SERTRALINE HCL 100 MG PO TABS: 100.0000 mg | ORAL_TABLET | Freq: Every day | ORAL | 5 refills | Status: DC

## 2018-09-15 NOTE — Patient Instructions (Addendum)
Sertraline tablets What is this medicine? SERTRALINE (SER tra leen) is used to treat depression. It may also be used to treat obsessive compulsive disorder, panic disorder, post-trauma stress, premenstrual dysphoric disorder (PMDD) or social anxiety. This medicine may be used for other purposes; ask your health care provider or pharmacist if you have questions. COMMON BRAND NAME(S): Zoloft What should I tell my health care provider before I take this medicine? They need to know if you have any of these conditions: -bleeding disorders -bipolar disorder or a family history of bipolar disorder -glaucoma -heart disease -high blood pressure -history of irregular heartbeat -history of low levels of calcium, magnesium, or potassium in the blood -if you often drink alcohol -liver disease -receiving electroconvulsive therapy -seizures -suicidal thoughts, plans, or attempt; a previous suicide attempt by you or a family member -take medicines that treat or prevent blood clots -thyroid disease -an unusual or allergic reaction to sertraline, other medicines, foods, dyes, or preservatives -pregnant or trying to get pregnant -breast-feeding How should I use this medicine? Take this medicine by mouth with a glass of water. Follow the directions on the prescription label. You can take it with or without food. Take your medicine at regular intervals. Do not take your medicine more often than directed. Do not stop taking this medicine suddenly except upon the advice of your doctor. Stopping this medicine too quickly may cause serious side effects or your condition may worsen. A special MedGuide will be given to you by the pharmacist with each prescription and refill. Be sure to read this information carefully each time. Talk to your pediatrician regarding the use of this medicine in children. While this drug may be prescribed for children as young as 7 years for selected conditions, precautions do  apply. Overdosage: If you think you have taken too much of this medicine contact a poison control center or emergency room at once. NOTE: This medicine is only for you. Do not share this medicine with others. What if I miss a dose? If you miss a dose, take it as soon as you can. If it is almost time for your next dose, take only that dose. Do not take double or extra doses. What may interact with this medicine? Do not take this medicine with any of the following medications: -cisapride -dofetilide -dronedarone -linezolid -MAOIs like Carbex, Eldepryl, Marplan, Nardil, and Parnate -methylene blue (injected into a vein) -pimozide -thioridazine This medicine may also interact with the following medications: -alcohol -amphetamines -aspirin and aspirin-like medicines -certain medicines for depression, anxiety, or psychotic disturbances -certain medicines for fungal infections like ketoconazole, fluconazole, posaconazole, and itraconazole -certain medicines for irregular heart beat like flecainide, quinidine, propafenone -certain medicines for migraine headaches like almotriptan, eletriptan, frovatriptan, naratriptan, rizatriptan, sumatriptan, zolmitriptan -certain medicines for sleep -certain medicines for seizures like carbamazepine, valproic acid, phenytoin -certain medicines that treat or prevent blood clots like warfarin, enoxaparin, dalteparin -cimetidine -digoxin -diuretics -fentanyl -isoniazid -lithium -NSAIDs, medicines for pain and inflammation, like ibuprofen or naproxen -other medicines that prolong the QT interval (cause an abnormal heart rhythm) -rasagiline -safinamide -supplements like St. John's wort, kava kava, valerian -tolbutamide -tramadol -tryptophan This list may not describe all possible interactions. Give your health care provider a list of all the medicines, herbs, non-prescription drugs, or dietary supplements you use. Also tell them if you smoke, drink  alcohol, or use illegal drugs. Some items may interact with your medicine. What should I watch for while using this medicine? Tell your doctor if your symptoms   do not get better or if they get worse. Visit your doctor or health care professional for regular checks on your progress. Because it may take several weeks to see the full effects of this medicine, it is important to continue your treatment as prescribed by your doctor. Patients and their families should watch out for new or worsening thoughts of suicide or depression. Also watch out for sudden changes in feelings such as feeling anxious, agitated, panicky, irritable, hostile, aggressive, impulsive, severely restless, overly excited and hyperactive, or not being able to sleep. If this happens, especially at the beginning of treatment or after a change in dose, call your health care professional. Bonita Quin may get drowsy or dizzy. Do not drive, use machinery, or do anything that needs mental alertness until you know how this medicine affects you. Do not stand or sit up quickly, especially if you are an older patient. This reduces the risk of dizzy or fainting spells. Alcohol may interfere with the effect of this medicine. Avoid alcoholic drinks. Your mouth may get dry. Chewing sugarless gum or sucking hard candy, and drinking plenty of water may help. Contact your doctor if the problem does not go away or is severe. What side effects may I notice from receiving this medicine? Side effects that you should report to your doctor or health care professional as soon as possible: -allergic reactions like skin rash, itching or hives, swelling of the face, lips, or tongue -anxious -black, tarry stools -changes in vision -confusion -elevated mood, decreased need for sleep, racing thoughts, impulsive behavior -eye pain -fast, irregular heartbeat -feeling faint or lightheaded, falls -feeling agitated, angry, or irritable -hallucination, loss of contact with  reality -loss of balance or coordination -loss of memory -painful or prolonged erections -restlessness, pacing, inability to keep still -seizures -stiff muscles -suicidal thoughts or other mood changes -trouble sleeping -unusual bleeding or bruising -unusually weak or tired -vomiting Side effects that usually do not require medical attention (report to your doctor or health care professional if they continue or are bothersome): -change in appetite or weight -change in sex drive or performance -diarrhea -increased sweating -indigestion, nausea -tremors This list may not describe all possible side effects. Call your doctor for medical advice about side effects. You may report side effects to FDA at 1-800-FDA-1088. Where should I keep my medicine? Keep out of the reach of children. Store at room temperature between 15 and 30 degrees C (59 and 86 degrees F). Throw away any unused medicine after the expiration date. NOTE: This sheet is a summary. It may not cover all possible information. If you have questions about this medicine, talk to your doctor, pharmacist, or health care provider.  2018 Elsevier/Gold Standard (2016-12-13 14:17:49)  Preparing for Pregnancy If you are considering becoming pregnant, make an appointment to see your regular health care provider to learn how to prepare for a safe and healthy pregnancy (preconception care). During a preconception care visit, your health care provider will:  Do a complete physical exam, including a Pap test.  Take a complete medical history.  Give you information, answer your questions, and help you resolve problems.  Preconception checklist Medical history  Tell your health care provider about any current or past medical conditions. Your pregnancy or your ability to become pregnant may be affected by chronic conditions, such as diabetes, chronic hypertension, and thyroid problems.  Include your family's medical history as well as  your partner's medical history.  Tell your health care provider about any history  of STIs (sexually transmitted infections).These can affect your pregnancy. In some cases, they can be passed to your baby. Discuss any concerns that you have about STIs.  If indicated, discuss the benefits of genetic testing. This testing will show whether there are any genetic conditions that may be passed from you or your partner to your baby.  Tell your health care provider about: ? Any problems you have had with conception or pregnancy. ? Any medicines you take. These include vitamins, herbal supplements, and over-the-counter medicines. ? Your history of immunizations. Discuss any vaccinations that you may need.  Diet  Ask your health care provider what to include in a healthy diet that has a balance of nutrients. This is especially important when you are pregnant or preparing to become pregnant.  Ask your health care provider to help you reach a healthy weight before pregnancy. ? If you are overweight, you may be at higher risk for certain complications, such as high blood pressure, diabetes, and preterm birth. ? If you are underweight, you are more likely to have a baby who has a low birth weight.  Lifestyle, work, and home  Let your health care provider know: ? About any lifestyle habits that you have, such as alcohol use, drug use, or smoking. ? About recreational activities that may put you at risk during pregnancy, such as downhill skiing and certain exercise programs. ? Tell your health care provider about any international travel, especially any travel to places with an active [place] virus outbreak. ? About harmful substances that you may be exposed to at work or at home. These include chemicals, pesticides, radiation, or even litter boxes. ? If you do not feel safe at home.  Mental health  Tell your health care provider about: ? Any history of mental health conditions, including feelings of  depression, sadness, or anxiety. ? Any medicines that you take for a mental health condition. These include herbs and supplements.  Home instructions to prepare for pregnancy Lifestyle  Eat a balanced diet. This includes fresh fruits and vegetables, whole grains, lean meats, low-fat dairy products, healthy fats, and foods that are high in fiber. Ask to meet with a nutritionist or registered dietitian for assistance with meal planning and goals.  Get regular exercise. Try to be active for at least 30 minutes a day on most days of the week. Ask your health care provider which activities are safe during pregnancy.  Do not use any products that contain nicotine or tobacco, such as cigarettes and e-cigarettes. If you need help quitting, ask your health care provider.  Do not drink alcohol.  Do not take illegal drugs.  Maintain a healthy weight. Ask your health care provider what weight range is right for you.  General instructions  Keep an accurate record of your menstrual periods. This makes it easier for your health care provider to determine your baby's due date.  Begin taking prenatal vitamins and folic acid supplements daily as directed by your health care provider.  Manage any chronic conditions, such as high blood pressure and diabetes, as told by your health care provider. This is important.  How do I know that I am pregnant? You may be pregnant if you have been sexually active and you miss your period. Symptoms of early pregnancy include:  Mild cramping.  Very light vaginal bleeding (spotting).  Feeling unusually tired.  Nausea and vomiting (morning sickness).  If you have any of these symptoms and you suspect that you might be  pregnant, you can take a home pregnancy test. These tests check for a hormone in your urine (human chorionic gonadotropin, or hCG). A woman's body begins to make this hormone during early pregnancy. These tests are very accurate. Wait until at least  the first day after you miss your period to take one. If the test shows that you are pregnant (you get a positive result), call your health care provider to make an appointment for prenatal care. What should I do if I become pregnant?  Make an appointment with your health care provider as soon as you suspect you are pregnant.  Do not use any products that contain nicotine, such as cigarettes, chewing tobacco, and e-cigarettes. If you need help quitting, ask your health care provider.  Do not drink alcoholic beverages. Alcohol is related to a number of birth defects.  Avoid toxic odors and chemicals.  You may continue to have sexual intercourse if it does not cause pain or other problems, such as vaginal bleeding. This information is not intended to replace advice given to you by your health care provider. Make sure you discuss any questions you have with your health care provider. Document Released: 11/21/2008 Document Revised: 08/06/2016 Document Reviewed: 06/30/2016 Elsevier Interactive Patient Education  2018 [employer]. Hydroxyzine capsules or tablets What is this medicine? HYDROXYZINE (hye DROX i zeen) is an antihistamine. This medicine is used to treat allergy symptoms. It is also used to treat anxiety and tension. This medicine can be used with other medicines to induce sleep before surgery. This medicine may be used for other purposes; ask your health care provider or pharmacist if you have questions. COMMON BRAND NAME(S): ANX, Atarax, Rezine, Vistaril What should I tell my health care provider before I take this medicine? They need to know if you have any of these conditions: -any chronic illness -difficulty passing urine -glaucoma -heart disease -kidney disease -liver disease -lung disease -an unusual or allergic reaction to hydroxyzine, cetirizine, other medicines, foods, dyes, or preservatives -pregnant or trying to get pregnant -breast-feeding How should I use this  medicine? Take this medicine by mouth with a full glass of water. Follow the directions on the prescription label. You may take this medicine with food or on an empty stomach. Take your medicine at regular intervals. Do not take your medicine more often than directed. Talk to your pediatrician regarding the use of this medicine in children. Special care may be needed. While this drug may be prescribed for children as young as 70 years of age for selected conditions, precautions do apply. Patients over 42 years old may have a stronger reaction and need a smaller dose. Overdosage: If you think you have taken too much of this medicine contact a poison control center or emergency room at once. NOTE: This medicine is only for you. Do not share this medicine with others. What if I miss a dose? If you miss a dose, take it as soon as you can. If it is almost time for your next dose, take only that dose. Do not take double or extra doses. What may interact with this medicine? -alcohol -barbiturate medicines for sleep or seizures -medicines for colds, allergies -medicines for depression, anxiety, or emotional disturbances -medicines for pain -medicines for sleep -muscle relaxants This list may not describe all possible interactions. Give your health care provider a list of all the medicines, herbs, non-prescription drugs, or dietary supplements you use. Also tell them if you smoke, drink alcohol, or use illegal drugs.  Some items may interact with your medicine. What should I watch for while using this medicine? Tell your doctor or health care professional if your symptoms do not improve. You may get drowsy or dizzy. Do not drive, use machinery, or do anything that needs mental alertness until you know how this medicine affects you. Do not stand or sit up quickly, especially if you are an older patient. This reduces the risk of dizzy or fainting spells. Alcohol may interfere with the effect of this medicine.  Avoid alcoholic drinks. Your mouth may get dry. Chewing sugarless gum or sucking hard candy, and drinking plenty of water may help. Contact your doctor if the problem does not go away or is severe. This medicine may cause dry eyes and blurred vision. If you wear contact lenses you may feel some discomfort. Lubricating drops may help. See your eye doctor if the problem does not go away or is severe. If you are receiving skin tests for allergies, tell your doctor you are using this medicine. What side effects may I notice from receiving this medicine? Side effects that you should report to your doctor or health care professional as soon as possible: -fast or irregular heartbeat -difficulty passing urine -seizures -slurred speech or confusion -tremor Side effects that usually do not require medical attention (report to your doctor or health care professional if they continue or are bothersome): -constipation -drowsiness -fatigue -headache -stomach upset This list may not describe all possible side effects. Call your doctor for medical advice about side effects. You may report side effects to FDA at 1-800-FDA-1088. Where should I keep my medicine? Keep out of the reach of children. Store at room temperature between 15 and 30 degrees C (59 and 86 degrees F). Keep container tightly closed. Throw away any unused medicine after the expiration date. NOTE: This sheet is a summary. It may not cover all possible information. If you have questions about this medicine, talk to your doctor, pharmacist, or health care provider.  2018 Elsevier/Gold Standard (2008-04-22 14:50:59)

## 2018-09-17 NOTE — Progress Notes (Signed)
GYN ENCOUNTER NOTE  Subjective:       Dana Hood is a 22 y.o. G0P0000 female is here for gynecologic evaluation of the following issues:  1. Needs Zoloft refill 2. Needs Vistaril refill  Reports increased moods with medication. Husband has been home since July 2019. They get her stepson every other weekend.   Declines flu vaccine. Contemplating pregnancy in the next year. Questions medication use in pregnancy.   Denies difficulty breathing or respiratory distress, chest pain, abdominal pain, excessive vaginal bleeding, dysuria, and leg pain or swelling.    Gynecologic History  Patient's last menstrual period was 09/11/2018 (exact date).  Contraception: NuvaRing vaginal inserts  Last Pap: 02/2017. Results were: normal  Obstetric History  OB History  Gravida Para Term Preterm AB Living  0 0 0 0 0 0  SAB TAB Ectopic Multiple Live Births  0 0 0 0 0    Past Medical History:  Diagnosis Date  . Anxiety     Past Surgical History:  Procedure Laterality Date  . NO PAST SURGERIES      Current Outpatient Medications on File Prior to Visit  Medication Sig Dispense Refill  . NUVARING 0.12-0.015 MG/24HR vaginal ring PLACE 1 RING VAGINALLY MONTHLY. LEAVE IN PLACE FOR 3 CONSECUTIVE WEEKS, THEN REMOVE FOR 1 WEEK. 1 each 12   No current facility-administered medications on file prior to visit.     No Known Allergies  Social History   Socioeconomic History  . Marital status: Married    Spouse name: Not on file  . Number of children: Not on file  . Years of education: Not on file  . Highest education level: Not on file  Occupational History  . Not on file  Social Needs  . Financial resource strain: Not on file  . Food insecurity:    Worry: Not on file    Inability: Not on file  . Transportation needs:    Medical: Not on file    Non-medical: Not on file  Tobacco Use  . Smoking status: Never Smoker  . Smokeless tobacco: Never Used  Substance and Sexual  Activity  . Alcohol use: No  . Drug use: No  . Sexual activity: Not Currently    Partners: Male    Birth control/protection: Inserts    Comment: nuvaring  Lifestyle  . Physical activity:    Days per week: Not on file    Minutes per session: Not on file  . Stress: Not on file  Relationships  . Social connections:    Talks on phone: Not on file    Gets together: Not on file    Attends religious service: Not on file    Active member of club or organization: Not on file    Attends meetings of clubs or organizations: Not on file    Relationship status: Not on file  . Intimate partner violence:    Fear of current or ex partner: Not on file    Emotionally abused: Not on file    Physically abused: Not on file    Forced sexual activity: Not on file  Other Topics Concern  . Not on file  Social History Narrative  . Not on file    Family History  Problem Relation Age of Onset  . Hyperlipidemia Mother   . Hypertension Mother   . Migraines Mother   . Heart failure Maternal Grandfather   . Rheum arthritis Maternal Uncle     The following portions of the patient's  history were reviewed and updated as appropriate: allergies, current medications, past family history, past medical history, past social history, past surgical history and problem list.  Review of Systems  ROS negative except as noted above. Information obtained from patient.    Objective:   BP 118/74   Pulse 79   Ht 5' (1.524 m)   Wt 126 lb 5 oz (57.3 kg)   LMP 09/11/2018 (Exact Date)   BMI 24.67 kg/m   GENERAL: Alert and oriented x 4, no apparent distress  Depression screen [facility] 2/9 09/15/2018 03/19/2018 01/05/2018 11/24/2017  Decreased Interest 0 0 1 1  Down, Depressed, Hopeless 0 1 0 1  PHQ - 2 Score 0 1 1 2   Altered sleeping 1 0 2 2  Tired, decreased energy 2 1 2 3   Change in appetite 0 2 1 1   Feeling bad or failure about yourself  0 1 0 2  Trouble concentrating 0 1 1 2   Moving slowly or fidgety/restless 0  0 2 2  Suicidal thoughts 0 0 0 0  PHQ-9 Score 3 6 9 14   Difficult doing work/chores - - Somewhat difficult Very difficult     Assessment:   1. Anxiety and depression  Plan:   Continue medication as prescribed.   Rx: Zoloft and Vistaril, see orders.   Education regarding preparing for pregnancy; see AVS.   Reviewed red flag symptoms and when to call.   RTC x 6 months for ANNUAL EXAM or sooner if needed.    Gunnar Bulla, CNM [facility], [facility]

## 2018-09-18 ENCOUNTER — Encounter: Payer: Self-pay | Admitting: Certified Nurse Midwife

## 2018-11-12 ENCOUNTER — Other Ambulatory Visit: Payer: Self-pay | Admitting: Certified Nurse Midwife

## 2019-02-01 ENCOUNTER — Other Ambulatory Visit: Payer: Self-pay

## 2019-02-01 ENCOUNTER — Encounter: Payer: Self-pay | Admitting: Certified Nurse Midwife

## 2019-02-01 MED ORDER — ETONOGESTREL-ETHINYL ESTRADIOL 0.12-0.015 MG/24HR VA RING: VAGINAL_RING | VAGINAL | 2 refills | Status: DC

## 2019-03-10 ENCOUNTER — Telehealth: Payer: BC Managed Care – PPO | Admitting: Family

## 2019-03-10 DIAGNOSIS — J069 Acute upper respiratory infection, unspecified: Secondary | ICD-10-CM

## 2019-03-10 MED ORDER — FLUTICASONE PROPIONATE 50 MCG/ACT NA SUSP: 2.0000 | Freq: Every day | NASAL | 6 refills | Status: DC

## 2019-03-10 NOTE — Progress Notes (Signed)
We are sorry you are not feeling well.  Here is how we plan to help!  Based on what you have shared with me, it looks like you may have a viral upper respiratory infection.  Upper respiratory infections are caused by a large number of viruses; however, rhinovirus is the most common cause.   Symptoms vary from person to person, with common symptoms including sore throat, cough, and fatigue or lack of energy.  A low-grade fever of up to 100.4 may present, but is often uncommon.  Symptoms vary however, and are closely related to a person's age or underlying illnesses.  The most common symptoms associated with an upper respiratory infection are nasal discharge or congestion, cough, sneezing, headache and pressure in the ears and face.  These symptoms usually persist for about 3 to 10 days, but can last up to 2 weeks.  It is important to know that upper respiratory infections do not cause serious illness or complications in most cases.    Upper respiratory infections can be transmitted from person to person, with the most common method of transmission being a person's hands.  The virus is able to live on the skin and can infect other persons for up to 2 hours after direct contact.  Also, these can be transmitted when someone coughs or sneezes; thus, it is important to cover the mouth to reduce this risk.  To keep the spread of the illness at bay, good hand hygiene is very important.  This is an infection that is most likely caused by a virus. There are no specific treatments other than to help you with the symptoms until the infection runs its course.  We are sorry you are not feeling well.  Here is how we plan to help!   For nasal congestion, you may use an oral decongestants such as Mucinex D or if you have glaucoma or high blood pressure use plain Mucinex.  Saline nasal spray or nasal drops can help and can safely be used as often as needed for congestion.  For your congestion, I have prescribed Fluticasone  nasal spray one spray in each nostril twice a day  If you do not have a history of heart disease, hypertension, diabetes or thyroid disease, prostate/bladder issues or glaucoma, you may also use Sudafed to treat nasal congestion.  It is highly recommended that you consult with a pharmacist or your primary care physician to ensure this medication is safe for you to take.     If you have a cough, you may use cough suppressants such as Delsym and Robitussin.  If you have glaucoma or high blood pressure, you can also use Coricidin HBP.    Approximately 5 minutes was spent documenting and reviewing patient's chart.    If you have a sore or scratchy throat, use a saltwater gargle-  to  teaspoon of salt dissolved in a 4-ounce to 8-ounce glass of warm water.  Gargle the solution for approximately 15-30 seconds and then spit.  It is important not to swallow the solution.  You can also use throat lozenges/cough drops and Chloraseptic spray to help with throat pain or discomfort.  Warm or cold liquids can also be helpful in relieving throat pain.  For headache, pain or general discomfort, you can use Ibuprofen or Tylenol as directed.   Some authorities believe that zinc sprays or the use of Echinacea may shorten the course of your symptoms.   HOME CARE . Only take medications as instructed by   your medical team. . Be sure to drink plenty of fluids. Water is fine as well as fruit juices, sodas and electrolyte beverages. You may want to stay away from caffeine or alcohol. If you are nauseated, try taking small sips of liquids. How do you know if you are getting enough fluid? Your urine should be a pale yellow or almost colorless. . Get rest. . Taking a steamy shower or using a humidifier may help nasal congestion and ease sore throat pain. You can place a towel over your head and breathe in the steam from hot water coming from a faucet. . Using a saline nasal spray works much the same way. . Cough drops,  hard candies and sore throat lozenges may ease your cough. . Avoid close contacts especially the very young and the elderly . Cover your mouth if you cough or sneeze . Always remember to wash your hands.   GET HELP RIGHT AWAY IF: . You develop worsening fever. . If your symptoms do not improve within 10 days . You develop yellow or green discharge from your nose over 3 days. . You have coughing fits . You develop a severe head ache or visual changes. . You develop shortness of breath, difficulty breathing or start having chest pain . Your symptoms persist after you have completed your treatment plan  MAKE SURE YOU   Understand these instructions.  Will watch your condition.  Will get help right away if you are not doing well or get worse.  Your e-visit answers were reviewed by a board certified advanced clinical practitioner to complete your personal care plan. Depending upon the condition, your plan could have included both over the counter or prescription medications. Please review your pharmacy choice. If there is a problem, you may call our nursing hot line at and have the prescription routed to another pharmacy. Your safety is important to us. If you have drug allergies check your prescription carefully.   You can use MyChart to ask questions about today's visit, request a non-urgent call back, or ask for a work or school excuse for 24 hours related to this e-Visit. If it has been greater than 24 hours you will need to follow up with your provider, or enter a new e-Visit to address those concerns. You will get an e-mail in the next two days asking about your experience.  I hope that your e-visit has been valuable and will speed your recovery. Thank you for using e-visits.      

## 2019-03-22 ENCOUNTER — Encounter: Payer: Self-pay | Admitting: Certified Nurse Midwife

## 2019-03-28 ENCOUNTER — Other Ambulatory Visit: Payer: Self-pay | Admitting: Certified Nurse Midwife

## 2019-04-28 ENCOUNTER — Encounter: Payer: Self-pay | Admitting: Certified Nurse Midwife

## 2019-06-02 ENCOUNTER — Encounter: Payer: Self-pay | Admitting: Certified Nurse Midwife

## 2019-06-02 ENCOUNTER — Other Ambulatory Visit: Payer: Self-pay

## 2019-06-02 MED ORDER — ETONOGESTREL-ETHINYL ESTRADIOL 0.12-0.015 MG/24HR VA RING: VAGINAL_RING | VAGINAL | 0 refills | Status: DC

## 2019-06-14 ENCOUNTER — Ambulatory Visit (INDEPENDENT_AMBULATORY_CARE_PROVIDER_SITE_OTHER): Payer: Managed Care, Other (non HMO) | Admitting: Certified Nurse Midwife

## 2019-06-14 ENCOUNTER — Other Ambulatory Visit: Payer: Self-pay

## 2019-06-14 ENCOUNTER — Encounter: Payer: Self-pay | Admitting: Certified Nurse Midwife

## 2019-06-14 VITALS — BP 122/84 | HR 79 | Ht 60.0 in | Wt 140.9 lb

## 2019-06-14 DIAGNOSIS — Z01419 Encounter for gynecological examination (general) (routine) without abnormal findings: Secondary | ICD-10-CM

## 2019-06-14 DIAGNOSIS — F419 Anxiety disorder, unspecified: Secondary | ICD-10-CM

## 2019-06-14 DIAGNOSIS — F329 Major depressive disorder, single episode, unspecified: Secondary | ICD-10-CM

## 2019-06-14 DIAGNOSIS — Z3044 Encounter for surveillance of vaginal ring hormonal contraceptive device: Secondary | ICD-10-CM

## 2019-06-14 MED ORDER — ETONOGESTREL-ETHINYL ESTRADIOL 0.12-0.015 MG/24HR VA RING: VAGINAL_RING | VAGINAL | 12 refills | Status: DC

## 2019-06-14 NOTE — Progress Notes (Signed)
Patient here for annual exam, will like to discuss lowering Sertraline dose.

## 2019-06-14 NOTE — Progress Notes (Signed)
ANNUAL PREVENTATIVE CARE GYN  ENCOUNTER NOTE  Subjective:       Dana Hood is a 23 y.o. G0P0000 female here for a routine annual gynecologic exam.  Current complaints: 1. Wishes to decrease Zoloft dose  Denies difficulty breathing or respiratory distress, chest pain, abdominal pain, excessive vaginal bleeding, dysuria, and leg pain or swelling.    Gynecologic History  Patient's last menstrual period was 06/05/2019 (exact date). Period Cycle (Days): 28 Period Duration (Days): 4 Period Pattern: Regular Menstrual Flow: Moderate, Light Menstrual Control: Tampon Dysmenorrhea: (!) Mild Dysmenorrhea Symptoms: Cramping  Contraception: NuvaRing vaginal inserts  Last Pap: 02/2017. Results were: normal  Obstetric History  OB History  Gravida Para Term Preterm AB Living  0 0 0 0 0 0  SAB TAB Ectopic Multiple Live Births  0 0 0 0 0    Past Medical History:  Diagnosis Date  . Anxiety     Past Surgical History:  Procedure Laterality Date  . NO PAST SURGERIES      Current Outpatient Medications on File Prior to Visit  Medication Sig Dispense Refill  . fluticasone (FLONASE) 50 MCG/ACT nasal spray Place 2 sprays into both nostrils daily. (Patient taking differently: Place 2 sprays into both nostrils as needed. ) 16 g 6  . hydrOXYzine (ATARAX/VISTARIL) 25 MG tablet Take 1 tablet (25 mg total) by mouth every 6 (six) hours as needed for anxiety. 30 tablet 2  . sertraline (ZOLOFT) 100 MG tablet Take 1 tablet (100 mg total) by mouth daily. 30 tablet 5   No current facility-administered medications on file prior to visit.     No Known Allergies  Social History   Socioeconomic History  . Marital status: Married    Spouse name: Not on file  . Number of children: Not on file  . Years of education: Not on file  . Highest education level: Not on file  Occupational History  . Not on file  Social Needs  . Financial resource strain: Not on file  . Food insecurity   Worry: Not on file    Inability: Not on file  . Transportation needs    Medical: Not on file    Non-medical: Not on file  Tobacco Use  . Smoking status: Never Smoker  . Smokeless tobacco: Never Used  Substance and Sexual Activity  . Alcohol use: No  . Drug use: No  . Sexual activity: Yes    Partners: Male    Birth control/protection: Inserts    Comment: Nuvaring  Lifestyle  . Physical activity    Days per week: Not on file    Minutes per session: Not on file  . Stress: Not on file  Relationships  . Social Musicianconnections    Talks on phone: Not on file    Gets together: Not on file    Attends religious service: Not on file    Active member of club or organization: Not on file    Attends meetings of clubs or organizations: Not on file    Relationship status: Not on file  . Intimate partner violence    Fear of current or ex partner: Not on file    Emotionally abused: Not on file    Physically abused: Not on file    Forced sexual activity: Not on file  Other Topics Concern  . Not on file  Social History Narrative  . Not on file    Family History  Problem Relation Age of Onset  . Hyperlipidemia Mother   .  Hypertension Mother   . Migraines Mother   . Heart failure Maternal Grandfather   . Rheum arthritis Maternal Uncle   . Breast cancer Neg Hx   . Ovarian cancer Neg Hx   . Colon cancer Neg Hx     The following portions of the patient's history were reviewed and updated as appropriate: allergies, current medications, past family history, past medical history, past social history, past surgical history and problem list.  Review of Systems  ROS negative except as noted above. Information obtained from patient.    Objective:   BP 122/84   Pulse 79   Ht 5' (1.524 m)   Wt 140 lb 14.4 oz (63.9 kg)   LMP 06/05/2019 (Exact Date)   BMI 27.52 kg/m    CONSTITUTIONAL: Well-developed, well-nourished female in no acute distress.   PSYCHIATRIC: Normal mood and affect.  Normal behavior. Normal judgment and thought content.  NEUROLGIC: Alert and oriented to person, place, and time. Normal muscle tone coordination. No cranial nerve deficit noted.  HENT:  Normocephalic, atraumatic, External right and left ear normal.   EYES: Conjunctivae and EOM are normal. Pupils are equal and round.   NECK: Normal range of motion, supple, no masses.  Normal thyroid.   SKIN: Skin is warm and dry. No rash noted. Not diaphoretic. No erythema. No pallor. Professional tattoos present.   CARDIOVASCULAR: Normal heart rate noted, regular rhythm, no murmur.  RESPIRATORY: Clear to auscultation bilaterally. Effort and breath sounds normal, no problems with respiration noted.  BREASTS: Symmetric in size. No masses, skin changes, nipple drainage, or lymphadenopathy. Bilateral nipple piercings present.   ABDOMEN: Soft, normal bowel sounds, no distention noted.  No tenderness, rebound or guarding.   PELVIC:  External Genitalia: Normal  Vagina: Normal  Cervix: Normal  Uterus: Normal  Adnexa: Normal  MUSCULOSKELETAL: Normal range of motion. No tenderness.  No cyanosis, clubbing, or edema.  2+ distal pulses.  LYMPHATIC: No Axillary, Supraclavicular, or Inguinal Adenopathy.  GAD 7 : Generalized Anxiety Score 06/14/2019 11/24/2017  Nervous, Anxious, on Edge 0 3  Control/stop worrying 1 1  Worry too much - different things 1 3  Trouble relaxing 0 3  Restless 0 1  Easily annoyed or irritable 1 3  Afraid - awful might happen 0 1  Total GAD 7 Score 3 15  Anxiety Difficulty Not difficult at all Somewhat difficult   [facility] 06/14/2019 09/15/2018 03/19/2018 01/05/2018 11/24/2017  Decreased Interest 0 0 0 1 1  Down, Depressed, Hopeless 0 0 1 0 1  PHQ - 2 Score 0 0 1 1 2   Altered sleeping 1 1 0 2 2  Tired, decreased energy 1 2 1 2 3   Change in appetite 0 0 2 1 1   Feeling bad or failure about yourself  0 0 1 0 2  Trouble concentrating 1 0 1 1 2   Moving slowly or  fidgety/restless 0 0 0 2 2  Suicidal thoughts 0 0 0 0 0  PHQ-9 Score 3 3 6 9 14   Difficult doing work/chores Not difficult at all - - Somewhat difficult Very difficult      Assessment:   Annual gynecologic examination 23 y.o.   Contraception: NuvaRing vaginal inserts   Overweight   Problem List Items Addressed This Visit    None    Visit Diagnoses    Well woman exam    -  Primary   Anxiety and depression       Encounter for surveillance of vaginal  ring hormonal contraceptive device          Plan:   Pap: Not needed  Labs: See orders, will need form completed for work   Routine preventative health maintenance measures emphasized: Exercise/Diet/Weight control, Tobacco Warnings, Alcohol/Substance use risks, Stress Management and Peer Pressure Issues; see AVS  Advised to decrease Zoloft by half and take for the next few weeks; follow up via MyChart  Rx: NuvaRing, see orders  Reviewed red flag symptoms and when to call  RTC x 1 year for ANNUAL EXAM with PAP or sooner if needed   Diona Fanti, CNM [facility], [facility] 06/14/19 10:20 AM

## 2019-06-14 NOTE — Patient Instructions (Addendum)
Preventive Care 18-39 Years, Female Preventive care refers to lifestyle choices and visits with your health care provider that can promote health and wellness. What does preventive care include?   A yearly physical exam. This is also called an annual well check.  Dental exams once or twice a year.  Routine eye exams. Ask your health care provider how often you should have your eyes checked.  Personal lifestyle choices, including: ? Daily care of your teeth and gums. ? Regular physical activity. ? Eating a healthy diet. ? Avoiding tobacco and drug use. ? Limiting alcohol use. ? Practicing safe sex. ? Taking vitamin and mineral supplements as recommended by your health care provider. What happens during an annual well check? The services and screenings done by your health care provider during your annual well check will depend on your age, overall health, lifestyle risk factors, and family history of disease. Counseling Your health care provider may ask you questions about your:  Alcohol use.  Tobacco use.  Drug use.  Emotional well-being.  Home and relationship well-being.  Sexual activity.  Eating habits.  Work and work environment.  Method of birth control.  Menstrual cycle.  Pregnancy history. Screening You may have the following tests or measurements:  Height, weight, and BMI.  Diabetes screening. This is done by checking your blood sugar (glucose) after you have not eaten for a while (fasting).  Blood pressure.  Lipid and cholesterol levels. These may be checked every 5 years starting at age 20.  Skin check.  Hepatitis C blood test.  Hepatitis B blood test.  Sexually transmitted disease (STD) testing.  BRCA-related cancer screening. This may be done if you have a family history of breast, ovarian, tubal, or peritoneal cancers.  Pelvic exam and Pap test. This may be done every 3 years starting at age 21. Starting at age 30, this may be done every 5  years if you have a Pap test in combination with an HPV test. Discuss your test results, treatment options, and if necessary, the need for more tests with your health care provider. Vaccines Your health care provider may recommend certain vaccines, such as:  Influenza vaccine. This is recommended every year.  Tetanus, diphtheria, and acellular pertussis (Tdap, Td) vaccine. You may need a Td booster every 10 years.  Varicella vaccine. You may need this if you have not been vaccinated.  HPV vaccine. If you are 26 or younger, you may need three doses over 6 months.  Measles, mumps, and rubella (MMR) vaccine. You may need at least one dose of MMR. You may also need a second dose.  Pneumococcal 13-valent conjugate (PCV13) vaccine. You may need this if you have certain conditions and were not previously vaccinated.  Pneumococcal polysaccharide (PPSV23) vaccine. You may need one or two doses if you smoke cigarettes or if you have certain conditions.  Meningococcal vaccine. One dose is recommended if you are age 19-21 years and a first-year college student living in a residence hall, or if you have one of several medical conditions. You may also need additional booster doses.  Hepatitis A vaccine. You may need this if you have certain conditions or if you travel or work in places where you may be exposed to hepatitis A.  Hepatitis B vaccine. You may need this if you have certain conditions or if you travel or work in places where you may be exposed to hepatitis B.  Haemophilus influenzae type b (Hib) vaccine. You may need this if you   have certain risk factors. Talk to your health care provider about which screenings and vaccines you need and how often you need them. This information is not intended to replace advice given to you by your health care provider. Make sure you discuss any questions you have with your health care provider. Document Released: 02/04/2002 Document Revised: 07/22/2017  Document Reviewed: 10/10/2015 Elsevier Interactive Patient Education  2019 Elsevier Inc. Sertraline tablets What is this medicine? SERTRALINE (SER tra leen) is used to treat depression. It may also be used to treat obsessive compulsive disorder, panic disorder, post-trauma stress, premenstrual dysphoric disorder (PMDD) or social anxiety. This medicine may be used for other purposes; ask your health care provider or pharmacist if you have questions. COMMON BRAND NAME(S): Zoloft What should I tell my health care provider before I take this medicine? They need to know if you have any of these conditions: -bleeding disorders -bipolar disorder or a family history of bipolar disorder -glaucoma -heart disease -high blood pressure -history of irregular heartbeat -history of low levels of calcium, magnesium, or potassium in the blood -if you often drink alcohol -liver disease -receiving electroconvulsive therapy -seizures -suicidal thoughts, plans, or attempt; a previous suicide attempt by you or a family member -take medicines that treat or prevent blood clots -thyroid disease -an unusual or allergic reaction to sertraline, other medicines, foods, dyes, or preservatives -pregnant or trying to get pregnant -breast-feeding How should I use this medicine? Take this medicine by mouth with a glass of water. Follow the directions on the prescription label. You can take it with or without food. Take your medicine at regular intervals. Do not take your medicine more often than directed. Do not stop taking this medicine suddenly except upon the advice of your doctor. Stopping this medicine too quickly may cause serious side effects or your condition may worsen. A special MedGuide will be given to you by the pharmacist with each prescription and refill. Be sure to read this information carefully each time. Talk to your pediatrician regarding the use of this medicine in children. While this drug may be  prescribed for children as young as 7 years for selected conditions, precautions do apply. Overdosage: If you think you have taken too much of this medicine contact a poison control center or emergency room at once. NOTE: This medicine is only for you. Do not share this medicine with others. What if I miss a dose? If you miss a dose, take it as soon as you can. If it is almost time for your next dose, take only that dose. Do not take double or extra doses. What may interact with this medicine? Do not take this medicine with any of the following medications: -cisapride -dofetilide -dronedarone -linezolid -MAOIs like Carbex, Eldepryl, Marplan, Nardil, and Parnate -methylene blue (injected into a vein) -pimozide -thioridazine This medicine may also interact with the following medications: -alcohol -amphetamines -aspirin and aspirin-like medicines -certain medicines for depression, anxiety, or psychotic disturbances -certain medicines for fungal infections like ketoconazole, fluconazole, posaconazole, and itraconazole -certain medicines for irregular heart beat like flecainide, quinidine, propafenone -certain medicines for migraine headaches like almotriptan, eletriptan, frovatriptan, naratriptan, rizatriptan, sumatriptan, zolmitriptan -certain medicines for sleep -certain medicines for seizures like carbamazepine, valproic acid, phenytoin -certain medicines that treat or prevent blood clots like warfarin, enoxaparin, dalteparin -cimetidine -digoxin -diuretics -fentanyl -isoniazid -lithium -NSAIDs, medicines for pain and inflammation, like ibuprofen or naproxen -other medicines that prolong the QT interval (cause an abnormal heart rhythm) -rasagiline -safinamide -supplements like St.  John's wort, kava kava, valerian -tolbutamide -tramadol -tryptophan This list may not describe all possible interactions. Give your health care provider a list of all the medicines, herbs,  non-prescription drugs, or dietary supplements you use. Also tell them if you smoke, drink alcohol, or use illegal drugs. Some items may interact with your medicine. What should I watch for while using this medicine? Tell your doctor if your symptoms do not get better or if they get worse. Visit your doctor or health care professional for regular checks on your progress. Because it may take several weeks to see the full effects of this medicine, it is important to continue your treatment as prescribed by your doctor. Patients and their families should watch out for new or worsening thoughts of suicide or depression. Also watch out for sudden changes in feelings such as feeling anxious, agitated, panicky, irritable, hostile, aggressive, impulsive, severely restless, overly excited and hyperactive, or not being able to sleep. If this happens, especially at the beginning of treatment or after a change in dose, call your health care professional. Dennis Bast may get drowsy or dizzy. Do not drive, use machinery, or do anything that needs mental alertness until you know how this medicine affects you. Do not stand or sit up quickly, especially if you are an older patient. This reduces the risk of dizzy or fainting spells. Alcohol may interfere with the effect of this medicine. Avoid alcoholic drinks. Your mouth may get dry. Chewing sugarless gum or sucking hard candy, and drinking plenty of water may help. Contact your doctor if the problem does not go away or is severe. What side effects may I notice from receiving this medicine? Side effects that you should report to your doctor or health care professional as soon as possible: -allergic reactions like skin rash, itching or hives, swelling of the face, lips, or tongue -anxious -black, tarry stools -changes in vision -confusion -elevated mood, decreased need for sleep, racing thoughts, impulsive behavior -eye pain -fast, irregular heartbeat -feeling faint or  lightheaded, falls -feeling agitated, angry, or irritable -hallucination, loss of contact with reality -loss of balance or coordination -loss of memory -painful or prolonged erections -restlessness, pacing, inability to keep still -seizures -stiff muscles -suicidal thoughts or other mood changes -trouble sleeping -unusual bleeding or bruising -unusually weak or tired -vomiting Side effects that usually do not require medical attention (report to your doctor or health care professional if they continue or are bothersome): -change in appetite or weight -change in sex drive or performance -diarrhea -increased sweating -indigestion, nausea -tremors This list may not describe all possible side effects. Call your doctor for medical advice about side effects. You may report side effects to FDA at 1-800-FDA-1088. Where should I keep my medicine? Keep out of the reach of children. Store at room temperature between 15 and 30 degrees C (59 and 86 degrees F). Throw away any unused medicine after the expiration date. NOTE: This sheet is a summary. It may not cover all possible information. If you have questions about this medicine, talk to your doctor, pharmacist, or health care provider.  2019 Elsevier/Gold Standard (2016-12-13 14:17:49)

## 2019-06-15 LAB — CBC
Hematocrit: 38.7 % (ref 34.0–46.6)
Hemoglobin: 12.9 g/dL (ref 11.1–15.9)
MCH: 29.5 pg (ref 26.6–33.0)
MCHC: 33.3 g/dL (ref 31.5–35.7)
MCV: 88 fL (ref 79–97)
Platelets: 271 10*3/uL (ref 150–450)
RBC: 4.38 x10E6/uL (ref 3.77–5.28)
RDW: 13.1 % (ref 11.7–15.4)
WBC: 4.9 10*3/uL (ref 3.4–10.8)

## 2019-06-15 LAB — LIPID PANEL
Chol/HDL Ratio: 2.4 ratio (ref 0.0–4.4)
Cholesterol, Total: 198 mg/dL (ref 100–199)
HDL: 81 mg/dL (ref >39)
LDL Calculated: 103 mg/dL — ABNORMAL HIGH (ref 0–99)
Triglycerides: 72 mg/dL (ref 0–149)
VLDL Cholesterol Cal: 14 mg/dL (ref 5–40)

## 2019-06-15 LAB — TSH: TSH: 1.01 u[IU]/mL (ref 0.450–4.500)

## 2019-06-15 LAB — GLUCOSE, RANDOM: Glucose: 78 mg/dL (ref 65–99)

## 2019-06-24 ENCOUNTER — Other Ambulatory Visit: Payer: Self-pay | Admitting: Certified Nurse Midwife

## 2019-07-06 ENCOUNTER — Encounter: Payer: Self-pay | Admitting: Adult Health

## 2019-07-06 ENCOUNTER — Telehealth: Payer: Managed Care, Other (non HMO) | Admitting: Adult Health

## 2019-07-06 ENCOUNTER — Other Ambulatory Visit: Payer: Self-pay

## 2019-07-06 DIAGNOSIS — R21 Rash and other nonspecific skin eruption: Secondary | ICD-10-CM | POA: Diagnosis not present

## 2019-07-06 DIAGNOSIS — L500 Allergic urticaria: Secondary | ICD-10-CM

## 2019-07-06 MED ORDER — CETIRIZINE HCL 10 MG PO TABS: 10.0000 mg | ORAL_TABLET | Freq: Every day | ORAL | 1 refills | Status: DC

## 2019-07-06 MED ORDER — PREDNISONE 10 MG (21) PO TBPK: ORAL_TABLET | ORAL | 0 refills | Status: DC

## 2019-07-06 MED ORDER — EPINEPHRINE 0.3 MG/0.3ML IJ SOAJ: 0.3000 mg | INTRAMUSCULAR | 1 refills | Status: DC | PRN

## 2019-07-06 NOTE — Patient Instructions (Addendum)
Benadryl 25 mg to 50 mg by mouth every 4 to 6 hours as needed for allergic reaction. Will cause drowsiness. ) keep liquid Benadryl or chewable benadryl with you at all times and take dose as above if needed.  Do not take Benadryl with Hydroxyzine can cause adverse affects .  Epi Pen sent in use as discussed/ prescribed.  Start Zyrtec 10 mg daily.  Medications sent to pharmacy as discussed.  I do recommend that you contact your primary care for referral to allergy testing. Call the clinic if you have any questions.  Call 911 for emergency symptoms. Will include anaphylaxis handout as well with emergency symptoms as we discussed.  If any symptoms worsen seek in office visit.    Hives Hives are itchy, red, swollen areas on your skin. Hives can show up on any part of your body. Hives often fade within 24 hours (acute hives). New hives can show up after old ones fade. This can go on for many days or weeks (chronic hives). Hives do not spread from person to person (are not contagious). Hives are caused by your body's response to something that you are allergic to (allergen). These are sometimes called triggers. You can get hives right after being around a trigger, or hours later. What are the causes?  Allergies to foods.  Insect bites or stings.  Pollen.  Pets.  Latex.  Chemicals.  Spending time in sunlight, heat, or cold.  Exercise.  Stress.  Some medicines.  Viruses. This includes the common cold.  Infections caused by germs (bacteria).  Allergy shots.  Blood transfusions. Sometimes, the cause is not known. What increases the risk?  Being a woman.  Being allergic to foods such as: ? Citrus fruits. ? Milk. ? Eggs. ? Peanuts. ? Tree nuts. ? Shellfish.  Being allergic to: ? Medicines. ? Latex. ? Insects. ? Animals. ? Pollen. What are the signs or symptoms?   Raised, itchy, red or white bumps or patches on your skin. These areas may: ? Get large and  swollen. ? Change in shape and location. ? Stand alone or connect to each other over a large area of skin. ? Sting or hurt. ? Turn white when pressed in the center (blanch). In very bad cases, your hands, feet, and face may also get swollen. This may happen if hives start deeper in your skin. How is this treated? Treatment for this condition depends on your symptoms. Treatment may include:  Using cool, wet cloths (cool compresses) or taking cool showers to stop the itching.  Medicines that help: ? Relieve itching (antihistamines). ? Reduce swelling (corticosteroids). ? Treat infection (antibiotics).  A medicine (omalizumab) that is given as a shot (injection). Your doctor may prescribe this if you have hives that do not get better even after other treatments.  In very bad cases, you may need a shot of a medicine called epinephrineto prevent a life-threatening allergic reaction (anaphylaxis). Follow these instructions at home: Medicines  Take or apply over-the-counter and prescription medicines only as told by your doctor.  If you were prescribed an antibiotic medicine, use it as told by your doctor. Do not stop using it even if you start to feel better. Skin care  Apply cool, wet cloths to the hives.  Do not scratch your skin. Do not rub your skin. General instructions  Do not take hot showers or baths. This can make itching worse.  Do not wear tight clothes.  Use sunscreen and wear clothes that cover  your skin when you are outside.  Avoid any triggers that cause your hives. Keep a journal to help track what causes your hives. Write down: ? What medicines you take. ? What you eat and drink. ? What products you use on your skin.  Keep all follow-up visits as told by your doctor. This is important. Contact a doctor if:  Your symptoms are not better with medicine.  Your joints hurt or are swollen. Get help right away if:  You have a fever.  You have pain in your  belly (abdomen).  Your tongue or lips are swollen.  Your eyelids are swollen.  Your chest or throat feels tight.  You have trouble breathing or swallowing. These symptoms may be an emergency. Do not wait to see if the symptoms will go away. Get medical help right away. Call your local emergency services (911 in the [place] Do not drive yourself to the hospital. Summary  Hives are itchy, red, swollen areas on your skin.  Treatment for this condition depends on your symptoms.  Avoid things that cause your hives. Keep a journal to help track what causes your hives.  Take and apply over-the-counter and prescription medicines only as told by your doctor.  Keep all follow-up visits as told by your doctor. This is important. This information is not intended to replace advice given to you by your health care provider. Make sure you discuss any questions you have with your health care provider. Document Released: 09/17/2008 Document Revised: 06/24/2018 Document Reviewed: 06/24/2018 Elsevier Patient Education  2020 [employer].  Allergies, Adult An allergy means that your body reacts to something that bothers it (allergen). It is not a normal reaction. This can happen from something that you:  Eat.  Breathe in.  Touch. You can have an allergy (be allergic) to:  Outdoor things, like: ? Pollen. ? Grass. ? Weeds.  Indoor things, like: ? Dust. ? Smoke. ? Pet dander.  Foods.  Medicines.  Things that bother your skin, like: ? Detergents. ? Chemicals. ? Latex.  Perfume.  Bugs. An allergy cannot spread from person to person (is not contagious). Follow these instructions at home:         Stay away from things that you know you are allergic to.  If you have allergies to things in the air, wash out your nose each day. Do it with one of these: ? A salt-water (saline) spray. ? A container (neti pot).  Take over-the-counter and prescription medicines only as told by  your doctor.  Keep all follow-up visits as told by your doctor. This is important.  If you are at risk for a very bad allergy reaction (anaphylaxis), keep an auto-injector with you all the time. This is called an epinephrine injection. ? This is pre-measured medicine with a needle. You can put it into your skin by yourself. ? Right after you have a very bad allergy reaction, you or a person with you must give the medicine in less than a few minutes. This is an emergency.  If you have ever had a very bad allergy reaction, wear a medical alert bracelet or necklace. Your very bad allergy should be written on it. Contact a health care provider if:  Your symptoms do not get better with treatment. Get help right away if:  You have symptoms of a very bad allergy reaction. These include: ? A swollen mouth, tongue, or throat. ? Pain or tightness in your chest. ? Trouble breathing. ? Being  short of breath. ? Dizziness. ? Fainting. ? Very bad pain in your belly (abdomen). ? Throwing up (vomiting). ? Watery poop (diarrhea). Summary  An allergy means that your body reacts to something that bothers it (allergen). It is not a normal reaction.  Stay away from things that make your body react.  Take over-the-counter and prescription medicines only as told by your doctor.  If you are at risk for a very bad allergy reaction, carry an auto-injector (epinephrine injection) all the time. Also, wear a medical alert bracelet or necklace so people know about your allergy. This information is not intended to replace advice given to you by your health care provider. Make sure you discuss any questions you have with your health care provider. Document Released: 04/05/2013 Document Revised: 03/30/2019 Document Reviewed: 03/24/2017 Elsevier Patient Education  2020 [employer]  Contact Dermatitis Dermatitis is redness, soreness, and swelling (inflammation) of the skin. Contact dermatitis is a reaction to  something that touches the skin. There are two types of contact dermatitis:  Irritant contact dermatitis. This happens when something bothers (irritates) your skin, like soap.  Allergic contact dermatitis. This is caused when you are exposed to something that you are allergic to, such as poison ivy. What are the causes?  Common causes of irritant contact dermatitis include: ? Makeup. ? Soaps. ? Detergents. ? Bleaches. ? Acids. ? Metals, such as nickel.  Common causes of allergic contact dermatitis include: ? Plants. ? Chemicals. ? Jewelry. ? Latex. ? Medicines. ? Preservatives in products, such as clothing. What increases the risk?  Having a job that exposes you to things that bother your skin.  Having asthma or eczema. What are the signs or symptoms? Symptoms may happen anywhere the irritant has touched your skin. Symptoms include:  Dry or flaky skin.  Redness.  Cracks.  Itching.  Pain or a burning feeling.  Blisters.  Blood or clear fluid draining from skin cracks. With allergic contact dermatitis, swelling may occur. This may happen in places such as the eyelids, mouth, or genitals. How is this treated?  This condition is treated by checking for the cause of the reaction and protecting your skin. Treatment may also include: ? Steroid creams, ointments, or medicines. ? Antibiotic medicines or other ointments, if you have a skin infection. ? Lotion or medicines to help with itching. ? A bandage (dressing). Follow these instructions at home: Skin care  Moisturize your skin as needed.  Put cool cloths on your skin.  Put a baking soda paste on your skin. Stir water into baking soda until it looks like a paste.  Do not scratch your skin.  Avoid having things rub up against your skin.  Avoid the use of soaps, perfumes, and dyes. Medicines  Take or apply over-the-counter and prescription medicines only as told by your doctor.  If you were prescribed an  antibiotic medicine, take or apply it as told by your doctor. Do not stop using it even if your condition starts to get better. Bathing  Take a bath with: ? Epsom salts. ? Baking soda. ? Colloidal oatmeal.  Bathe less often.  Bathe in warm water. Avoid using hot water. Bandage care  If you were given a bandage, change it as told by your health care provider.  Wash your hands with soap and water before and after you change your bandage. If soap and water are not available, use hand sanitizer. General instructions  Avoid the things that caused your reaction. If you  do not know what caused it, keep a journal. Write down: ? What you eat. ? What skin products you use. ? What you drink. ? What you wear in the area that has symptoms. This includes jewelry.  Check the affected areas every day for signs of infection. Check for: ? More redness, swelling, or pain. ? More fluid or blood. ? Warmth. ? Pus or a bad smell.  Keep all follow-up visits as told by your doctor. This is important. Contact a doctor if:  You do not get better with treatment.  Your condition gets worse.  You have signs of infection, such as: ? More swelling. ? Tenderness. ? More redness. ? Soreness. ? Warmth.  You have a fever.  You have new symptoms. Get help right away if:  You have a very bad headache.  You have neck pain.  Your neck is stiff.  You throw up (vomit).  You feel very sleepy.  You see red streaks coming from the area.  Your bone or joint near the area hurts after the skin has healed.  The area turns darker.  You have trouble breathing. Summary  Dermatitis is redness, soreness, and swelling of the skin.  Symptoms may occur where the irritant has touched you.  Treatment may include medicines and skin care.  If you do not know what caused your reaction, keep a journal.  Contact a doctor if your condition gets worse or you have signs of infection. This information is  not intended to replace advice given to you by your health care provider. Make sure you discuss any questions you have with your health care provider. Document Released: 10/06/2009 Document Revised: 03/31/2019 Document Reviewed: 06/24/2018 Elsevier Patient Education  2020 [employer] Inc. Cetirizine tablets What is this medicine? CETIRIZINE (se TI ra zeen) is an antihistamine. This medicine is used to treat or prevent symptoms of allergies. It is also used to help reduce itchy skin rash and hives. This medicine may be used for other purposes; ask your health care provider or pharmacist if you have questions. COMMON BRAND NAME(S): All Day Allergy, Allergy Relief, Zyrtec, Zyrtec Hives Relief What should I tell my health care provider before I take this medicine? They need to know if you have any of these conditions:  kidney disease  liver disease  an unusual or allergic reaction to cetirizine, hydroxyzine, other medicines, foods, dyes, or preservatives  pregnant or trying to get pregnant  breast-feeding How should I use this medicine? Take this medicine by mouth with a glass of water. Follow the directions on the prescription label. You can take this medicine with food or on an empty stomach. Take your medicine at regular times. Do not take more often than directed. You may need to take this medicine for several days before your symptoms improve. Talk to your pediatrician regarding the use of this medicine in children. Special care may be needed. While this drug may be prescribed for children as young as 57 years of age for selected conditions, precautions do apply. Overdosage: If you think you have taken too much of this medicine contact a poison control center or emergency room at once. NOTE: This medicine is only for you. Do not share this medicine with others. What if I miss a dose? If you miss a dose, take it as soon as you can. If it is almost time for your next dose, take only that dose.  Do not take double or extra doses. What may interact with this  medicine?  alcohol  certain medicines for anxiety or sleep  narcotic medicines for pain  other medicines for colds or allergies This list may not describe all possible interactions. Give your health care provider a list of all the medicines, herbs, non-prescription drugs, or dietary supplements you use. Also tell them if you smoke, drink alcohol, or use illegal drugs. Some items may interact with your medicine. What should I watch for while using this medicine? Visit your doctor or health care professional for regular checks on your health. Tell your doctor if your symptoms do not improve. You may get drowsy or dizzy. Do not drive, use machinery, or do anything that needs mental alertness until you know how this medicine affects you. Do not stand or sit up quickly, especially if you are an older patient. This reduces the risk of dizzy or fainting spells. Your mouth may get dry. Chewing sugarless gum or sucking hard candy, and drinking plenty of water may help. Contact your doctor if the problem does not go away or is severe. What side effects may I notice from receiving this medicine? Side effects that you should report to your doctor or health care professional as soon as possible:  allergic reactions like skin rash, itching or hives, swelling of the face, lips, or tongue  changes in vision or hearing  fast or irregular heartbeat  trouble passing urine or change in the amount of urine Side effects that usually do not require medical attention (report to your doctor or health care professional if they continue or are bothersome):  dizziness  dry mouth  irritability  sore throat  stomach pain  tiredness This list may not describe all possible side effects. Call your doctor for medical advice about side effects. You may report side effects to FDA at 1-800-FDA-1088. Where should I keep my medicine? Keep out of the  reach of children. Store at room temperature between 15 and 30 degrees C (59 and 86 degrees F). Throw away any unused medicine after the expiration date. NOTE: This sheet is a summary. It may not cover all possible information. If you have questions about this medicine, talk to your doctor, pharmacist, or health care provider.  2020 Elsevier/Gold Standard (2015-01-03 13:44:42) Prednisolone tablets What is this medicine? PREDNISOLONE (pred NISS oh lone) is a corticosteroid. It is commonly used to treat inflammation of the skin, joints, lungs, and other organs. Common conditions treated include asthma, allergies, and arthritis. It is also used for other conditions, such as blood disorders and diseases of the adrenal glands. This medicine may be used for other purposes; ask your health care provider or pharmacist if you have questions. COMMON BRAND NAME(S): Millipred, Millipred DP, Millipred DP 12-Day, Millipred DP 6 Day, Prednoral What should I tell my health care provider before I take this medicine? They need to know if you have any of these conditions:  Cushing's syndrome  diabetes  glaucoma  heart problems or disease  high blood pressure  infection such as herpes, measles, tuberculosis, or chickenpox  kidney disease  liver disease  mental problems  myasthenia gravis  osteoporosis  seizures  stomach ulcer or intestine disease including colitis and diverticulitis  thyroid problem  an unusual or allergic reaction to lactose, prednisolone, other medicines, foods, dyes, or preservatives  pregnant or trying to get pregnant  breast-feeding How should I use this medicine? Take this medicine by mouth with a glass of water. Follow the directions on the prescription label. Take it with food or  milk to avoid stomach upset. If you are taking this medicine once a day, take it in the morning. Do not take more medicine than you are told to take. Do not suddenly stop taking your  medicine because you may develop a severe reaction. Your doctor will tell you how much medicine to take. If your doctor wants you to stop the medicine, the dose may be slowly lowered over time to avoid any side effects. Talk to your pediatrician regarding the use of this medicine in children. Special care may be needed. Overdosage: If you think you have taken too much of this medicine contact a poison control center or emergency room at once. NOTE: This medicine is only for you. Do not share this medicine with others. What if I miss a dose? If you miss a dose, take it as soon as you can. If it is almost time for your next dose, take only that dose. Do not take double or extra doses. What may interact with this medicine? Do not take this medicine with any of the following medications:  metyrapone  mifepristone This medicine may also interact with the following medications:  aminoglutethimide  amphotericin B  aspirin and aspirin-like medicines  barbiturates  certain medicines for diabetes, like glipizide or glyburide  cholestyramine  cholinesterase inhibitors  cyclosporine  digoxin  diuretics  ephedrine  female hormones, like estrogens and birth control pills  isoniazid  ketoconazole  NSAIDS, medicines for pain and inflammation, like ibuprofen or naproxen  phenytoin  rifampin  toxoids  vaccines  warfarin This list may not describe all possible interactions. Give your health care provider a list of all the medicines, herbs, non-prescription drugs, or dietary supplements you use. Also tell them if you smoke, drink alcohol, or use illegal drugs. Some items may interact with your medicine. What should I watch for while using this medicine? Visit your doctor or health care professional for regular checks on your progress. If you are taking this medicine over a prolonged period, carry an identification card with your name and address, the type and dose of your medicine,  and your doctor's name and address. This medicine may increase your risk of getting an infection. Tell your doctor or health care professional if you are around anyone with measles or chickenpox, or if you develop sores or blisters that do not heal properly. If you are going to have surgery, tell your doctor or health care professional that you have taken this medicine within the last twelve months. Ask your doctor or health care professional about your diet. You may need to lower the amount of salt you eat. This medicine may increase blood sugar. Ask your healthcare provider if changes in diet or medicines are needed if you have diabetes. What side effects may I notice from receiving this medicine? Side effects that you should report to your doctor or health care professional as soon as possible:  allergic reactions like skin rash, itching or hives, swelling of the face, lips, or tongue  changes in emotions or moods  eye pain   signs and symptoms of high blood sugar such as being more thirsty or hungry or having to urinate more than normal. You may also feel very tired or have blurry vision.  signs and symptoms of infection like fever or chills; cough; sore throat; pain or trouble passing urine  slow growth in children (if used for longer periods of time)  swelling of ankles, feet  trouble sleeping  weak bones (if  used for longer periods of time) Side effects that usually do not require medical attention (report to your doctor or health care professional if they continue or are bothersome):  nausea  skin problems, acne, thin and shiny skin  upset stomach  weight gain This list may not describe all possible side effects. Call your doctor for medical advice about side effects. You may report side effects to FDA at 1-800-FDA-1088. Where should I keep my medicine? Keep out of the reach of children. Store at room temperature between 15 and 30 degrees C (59 and 86 degrees F). Keep  container tightly closed. Throw away any unused medicine after the expiration date. NOTE: This sheet is a summary. It may not cover all possible information. If you have questions about this medicine, talk to your doctor, pharmacist, or health care provider.  2020 Elsevier/Gold Standard (2018-09-10 10:30:56) Diphenhydramine capsules or tablets What is this medicine? DIPHENHYDRAMINE (dye fen HYE dra meen) is an antihistamine. It is used to treat the symptoms of an allergic reaction. It is also used to treat Parkinson's disease. This medicine is also used to prevent and to treat motion sickness and as a nighttime sleep aid. This medicine may be used for other purposes; ask your health care provider or pharmacist if you have questions. COMMON BRAND NAME(S): Alka-Seltzer Plus Allergy, Aller-G-Time, Banophen, Benadryl Allergy, Benadryl Allergy Dye Free, Benadryl Allergy Kapgel, Benadryl Allergy Ultratab, Diphedryl, Diphenhist, Genahist, Geri-Dryl, PHARBEDRYL, Q-Dryl, Gretta Began, Valu-Dryl, Vicks ZzzQuil Nightime Sleep-Aid What should I tell my health care provider before I take this medicine? They need to know if you have any of these conditions:  asthma or lung disease  glaucoma  high blood pressure or heart disease  liver disease  pain or difficulty passing urine  prostate trouble  ulcers or other stomach problems  an unusual or allergic reaction to diphenhydramine, other medicines foods, dyes, or preservatives such as sulfites  pregnant or trying to get pregnant  breast-feeding How should I use this medicine? Take this medicine by mouth with a full glass of water. Follow the directions on the prescription label. Take your doses at regular intervals. Do not take your medicine more often than directed. To prevent motion sickness start taking this medicine 30 to 60 minutes before you leave. Talk to your pediatrician regarding the use of this medicine in children. Special care may be  needed. Patients over 28 years old may have a stronger reaction and need a smaller dose. Overdosage: If you think you have taken too much of this medicine contact a poison control center or emergency room at once. NOTE: This medicine is only for you. Do not share this medicine with others. What if I miss a dose? If you miss a dose, take it as soon as you can. If it is almost time for your next dose, take only that dose. Do not take double or extra doses. What may interact with this medicine? Do not take this medicine with any of the following medications:  MAOIs like Carbex, Eldepryl, Marplan, Nardil, and Parnate This medicine may also interact with the following medications:  alcohol  barbiturates, like phenobarbital  medicines for bladder spasm like oxybutynin, tolterodine  medicines for blood pressure  medicines for depression, anxiety, or psychotic disturbances  medicines for movement abnormalities or Parkinson's disease  medicines for sleep  other medicines for cold, cough or allergy  some medicines for the stomach like chlordiazepoxide, dicyclomine This list may not describe all possible interactions. Give your health care  provider a list of all the medicines, herbs, non-prescription drugs, or dietary supplements you use. Also tell them if you smoke, drink alcohol, or use illegal drugs. Some items may interact with your medicine. What should I watch for while using this medicine? Visit your doctor or health care professional for regular check ups. Tell your doctor if your symptoms do not improve or if they get worse. Your mouth may get dry. Chewing sugarless gum or sucking hard candy, and drinking plenty of water may help. Contact your doctor if the problem does not go away or is severe. This medicine may cause dry eyes and blurred vision. If you wear contact lenses you may feel some discomfort. Lubricating drops may help. See your eye doctor if the problem does not go away or  is severe. You may get drowsy or dizzy. Do not drive, use machinery, or do anything that needs mental alertness until you know how this medicine affects you. Do not stand or sit up quickly, especially if you are an older patient. This reduces the risk of dizzy or fainting spells. Alcohol may interfere with the effect of this medicine. Avoid alcoholic drinks. What side effects may I notice from receiving this medicine? Side effects that you should report to your doctor or health care professional as soon as possible:  allergic reactions like skin rash, itching or hives, swelling of the face, lips, or tongue  changes in vision  confused, agitated, nervous  irregular or fast heartbeat  tremor  trouble passing urine  unusual bleeding or bruising  unusually weak or tired Side effects that usually do not require medical attention (report to your doctor or health care professional if they continue or are bothersome):  constipation, diarrhea  drowsy  headache  loss of appetite  stomach upset, vomiting  thick mucous This list may not describe all possible side effects. Call your doctor for medical advice about side effects. You may report side effects to FDA at 1-800-FDA-1088. Where should I keep my medicine? Keep out of the reach of children. Store at room temperature between 15 and 30 degrees C (59 and 86 degrees F). Keep container closed tightly. Throw away any unused medicine after the expiration date. NOTE: This sheet is a summary. It may not cover all possible information. If you have questions about this medicine, talk to your doctor, pharmacist, or health care provider.  2020 Elsevier/Gold Standard (2008-03-28 17:06:22) Epinephrine injection (Auto-injector) What is this medicine? EPINEPHRINE (ep i NEF rin) is used for the emergency treatment of severe allergic reactions. You should keep this medicine with you at all times. This medicine may be used for other purposes; ask  your health care provider or pharmacist if you have questions. COMMON BRAND NAME(S): Adrenaclick, Auvi-Q, epinephrinesnap, epinephrinesnap-v, EpiPen, EPIsnap Epinephrine, SYMJEPI, Twinject What should I tell my health care provider before I take this medicine? They need to know if you have any of the following conditions:  diabetes  heart disease  high blood pressure  lung or breathing disease, like asthma  Parkinson's disease  thyroid disease  an unusual or allergic reaction to epinephrine, sulfites, other medicines, foods, dyes, or preservatives  pregnant or trying to get pregnant  breast-feeding How should I use this medicine? This medicine is for injection into the outer thigh. Your doctor or health care professional will instruct you on the proper use of the device during an emergency. Read all directions carefully and make sure you understand them. Do not use more often than  directed. Talk to your pediatrician regarding the use of this medicine in children. Special care may be needed. This drug is commonly used in children. A special device is available for use in children. If you are giving this medicine to a young child, hold their leg firmly in place before and during the injection to prevent injury. Overdosage: If you think you have taken too much of this medicine contact a poison control center or emergency room at once. NOTE: This medicine is only for you. Do not share this medicine with others. What if I miss a dose? This does not apply. You should only use this medicine for an allergic reaction. What may interact with this medicine? This medicine is only used during an emergency. Significant drug interactions are not likely during emergency use. This list may not describe all possible interactions. Give your health care provider a list of all the medicines, herbs, non-prescription drugs, or dietary supplements you use. Also tell them if you smoke, drink alcohol, or use  illegal drugs. Some items may interact with your medicine. What should I watch for while using this medicine? Keep this medicine ready for use in the case of a severe allergic reaction. Make sure that you have the phone number of your doctor or health care professional and local hospital ready. Remember to check the expiration date of your medicine regularly. You may need to have additional units of this medicine with you at work, school, or other places. Talk to your doctor or health care professional about your need for extra units. Some emergencies may require an additional dose. Check with your doctor or a health care professional before using an extra dose. After use, go to the nearest hospital or call 911. Avoid physical activity. Make sure the treating health care professional knows you have received an injection of this medicine. You will receive additional instructions on what to do during and after use of this medicine before a medical emergency occurs. What side effects may I notice from receiving this medicine? Side effects that you should report to your doctor or health care professional as soon as possible:  allergic reactions like skin rash, itching or hives, swelling of the face, lips, or tongue  breathing problems  chest pain  fast, irregular heartbeat  pain, tingling, numbness in the hands or feet  pain, redness, or irritation at site where injected  vomiting Side effects that usually do not require medical attention (report to your doctor or health care professional if they continue or are bothersome):  anxious  dizziness  dry mouth  headache  increased sweating  nausea  unusually weak or tired This list may not describe all possible side effects. Call your doctor for medical advice about side effects. You may report side effects to FDA at 1-800-FDA-1088. Where should I keep my medicine? Keep out of the reach of children. Store at room temperature between 15  and 30 degrees C (59 and 86 degrees F). Protect from light and heat. The solution should be clear in color. If the solution is discolored or contains particles it must be replaced. Throw away any unused medicine after the expiration date. Ask your doctor or pharmacist about proper disposal of the injector if it is expired or has been used. Always replace your auto-injector before it expires. NOTE: This sheet is a summary. It may not cover all possible information. If you have questions about this medicine, talk to your doctor, pharmacist, or health care provider.  2020  Elsevier/Gold Standard (2015-05-15 12:24:50)  Epinephrine injection (Auto-injector) What is this medicine? EPINEPHRINE (ep i NEF rin) is used for the emergency treatment of severe allergic reactions. You should keep this medicine with you at all times. This medicine may be used for other purposes; ask your health care provider or pharmacist if you have questions. COMMON BRAND NAME(S): Adrenaclick, Auvi-Q, epinephrinesnap, epinephrinesnap-v, EpiPen, EPIsnap Epinephrine, SYMJEPI, Twinject What should I tell my health care provider before I take this medicine? They need to know if you have any of the following conditions:  diabetes  heart disease  high blood pressure  lung or breathing disease, like asthma  Parkinson's disease  thyroid disease  an unusual or allergic reaction to epinephrine, sulfites, other medicines, foods, dyes, or preservatives  pregnant or trying to get pregnant  breast-feeding How should I use this medicine? This medicine is for injection into the outer thigh. Your doctor or health care professional will instruct you on the proper use of the device during an emergency. Read all directions carefully and make sure you understand them. Do not use more often than directed. Talk to your pediatrician regarding the use of this medicine in children. Special care may be needed. This drug is commonly used in  children. A special device is available for use in children. If you are giving this medicine to a young child, hold their leg firmly in place before and during the injection to prevent injury. Overdosage: If you think you have taken too much of this medicine contact a poison control center or emergency room at once. NOTE: This medicine is only for you. Do not share this medicine with others. What if I miss a dose? This does not apply. You should only use this medicine for an allergic reaction. What may interact with this medicine? This medicine is only used during an emergency. Significant drug interactions are not likely during emergency use. This list may not describe all possible interactions. Give your health care provider a list of all the medicines, herbs, non-prescription drugs, or dietary supplements you use. Also tell them if you smoke, drink alcohol, or use illegal drugs. Some items may interact with your medicine. What should I watch for while using this medicine? Keep this medicine ready for use in the case of a severe allergic reaction. Make sure that you have the phone number of your doctor or health care professional and local hospital ready. Remember to check the expiration date of your medicine regularly. You may need to have additional units of this medicine with you at work, school, or other places. Talk to your doctor or health care professional about your need for extra units. Some emergencies may require an additional dose. Check with your doctor or a health care professional before using an extra dose. After use, go to the nearest hospital or call 911. Avoid physical activity. Make sure the treating health care professional knows you have received an injection of this medicine. You will receive additional instructions on what to do during and after use of this medicine before a medical emergency occurs. What side effects may I notice from receiving this medicine? Side effects that  you should report to your doctor or health care professional as soon as possible:  allergic reactions like skin rash, itching or hives, swelling of the face, lips, or tongue  breathing problems  chest pain  fast, irregular heartbeat  pain, tingling, numbness in the hands or feet  pain, redness, or irritation at site where injected  vomiting Side effects that usually do not require medical attention (report to your doctor or health care professional if they continue or are bothersome):  anxious  dizziness  dry mouth  headache  increased sweating  nausea  unusually weak or tired This list may not describe all possible side effects. Call your doctor for medical advice about side effects. You may report side effects to FDA at 1-800-FDA-1088. Where should I keep my medicine? Keep out of the reach of children. Store at room temperature between 15 and 30 degrees C (59 and 86 degrees F). Protect from light and heat. The solution should be clear in color. If the solution is discolored or contains particles it must be replaced. Throw away any unused medicine after the expiration date. Ask your doctor or pharmacist about proper disposal of the injector if it is expired or has been used. Always replace your auto-injector before it expires. NOTE: This sheet is a summary. It may not cover all possible information. If you have questions about this medicine, talk to your doctor, pharmacist, or health care provider.  2020 Elsevier/Gold Standard (2015-05-15 12:24:50)  Anaphylactic Reaction, Adult An anaphylactic reaction (anaphylaxis) is a sudden, serious allergic reaction. This affects more than one part of your body. It can be life-threatening. If you have an anaphylactic reaction, you need to get medical help right away. What are the causes? This condition is caused by exposure to things that give you an allergic reaction (allergens). Common allergens include:  Foods, such as peanuts,  wheat, shellfish, milk, and eggs.  Medicines.  Insect bites or stings.  Blood or parts of blood received for treatment (transfusions).  Chemicals, such as latex and dyes that are used in food and in medical tests. What are the signs or symptoms? Signs of an anaphylactic reaction may include:  Feeling warm in the face (flushed). Your face may turn red.  Itchy, red, swollen areas of skin (hives).  Swelling of the: ? Eyes. ? Lips. ? Face. ? Mouth. ? Tongue. ? Throat.  Trouble with any of these: ? Breathing. ? Talking. ? Swallowing.  Loud breathing (wheezing).  Feeling dizzy or light-headed.  Passing out (fainting).  Pain or cramps in your belly.  Throwing up (vomiting).  Watery poop (diarrhea). How is this diagnosed? This condition is diagnosed based on:  Your symptoms.  A physical exam.  Blood tests.  Recent exposure to things that give you an allergic reaction. How is this treated? If you think you are having an anaphylactic reaction, you should do this right away:  Give yourself a shot of medicine (epinephrine) using an auto-injector "pen." Your doctor will teach you how to use this pen.  Call for emergency help. If you use a pen, you must still get treated in the hospital. There, you may be given: ? Medicines. ? Oxygen. ? Fluids in an IV tube. Follow these instructions at home: Safety  Always keep an auto-injector pen with you. This could save your life. Use it as told by your doctor.  Do not drive after a reaction. Wait until your doctor says it is safe to drive.  Make sure that you, the people who live with you, and your employer know: ? What you are allergic to, so you can stay away from it. ? How to use your auto-injector pen.  Wear a bracelet or necklace that says you have an allergy, if your doctor tells you to do this.  Learn the signs of a very bad allergic  reaction. This way, you can treat it right away.  Work with your doctors to  make a plan for what to do if you have a very bad reaction. It is important to be ready. If you use your auto-injector pen:   Get more medicine (epinephrine) for your pen right away. This is important in case you have another reaction.  Get help right away. To avoid a serious allergic reaction:  Avoid things that gave you a very bad allergic reaction before.  Tell your server about your allergy when you go out to eat. If you are not sure if your meal has food that you are allergic to, ask your server before you eat it. General instructions  Take over-the-counter and prescription medicines only as told by your doctor.  If you have itchy, red, swollen areas of skin or a rash: ? Use an over-the-counter medicine (antihistamine) as told by your doctor. ? Put cold, wet cloths on your skin. ? Take a cool bath or shower. Avoid hot water.  Tell all doctors who care for you that you have an allergy.  Keep all follow-up visits as told by your doctor. This is important. Get help right away if:  You have signs of an allergic reaction. You may notice them soon after being exposed to things that give you an allergic reaction. Signs may include: ? Warmth in your face. Your face may turn red. ? Itchy, red, swollen areas of skin. ? Swelling of your:  Eyes.  Lips.  Face.  Mouth.  Tongue.  Throat. ? Trouble with any of these:  Breathing.  Talking.  Swallowing. ? Loud breathing (wheezing). ? Feeling dizzy or light-headed. ? Passing out. ? Pain or cramps in your belly. ? Throwing up. ? Watery poop.  You had to use your auto-injector pen. You must go to the emergency room even if the medicine seems to be working. This is because another allergic reaction may happen within 3 days (rebound anaphylaxis). These symptoms may be an emergency. Do not wait to see if the symptoms will go away. Do this right away:  Use your auto-injector pen as you have been told.  Get medical help. Call  your local emergency services (911 in the [place] Do not drive yourself to the hospital. Summary  An anaphylactic reaction (anaphylaxis) is a sudden, serious allergic reaction.  This condition can be life-threatening. If you have a reaction, get medical help right away.  Your doctor will show you how to give yourself a shot (epinephrine injection) with an auto-injector "pen."  Always keep an auto-injector pen with you. It could save your life. Use it as told by your doctor.  If you had to use your auto-injector pen, you must go to the emergency room. Go there even if the medicine seems to be working. This information is not intended to replace advice given to you by your health care provider. Make sure you discuss any questions you have with your health care provider. Document Released: 05/27/2008 Document Revised: 04/02/2018 Document Reviewed: 04/02/2018 Elsevier Patient Education  2020 [employer].

## 2019-07-06 NOTE — Progress Notes (Signed)
Virtual Visit via Video Note  I connected with Dana Hood on 07/06/19 at  9:00 AM EDT by a video enabled telemedicine application and verified that I am speaking with the correct person using two identifiers.  Location: Patient: at home  Provider: [facility], [place] [facility] [facility], [place]     I discussed the limitations of evaluation and management by telemedicine and the availability of in person appointments. The patient expressed understanding and agreed to proceed.  History of Present Illness: Patient is a 23 year old female who calls the clinic for a virtual visit for a rash.  She reports that she awoke from sleep about 445 am today 07/06/19 with intense itching, and a rash that started at her feet, bilateral legs and torso and  spread to her face forehead only. Scalp was itchy and is resolving. She is still itching in the other areas.  She reports her lower lip was swollen at that time, and has since resolved. She reports her rash is starting to fade compared to what it was this morning.   She has not taken any medications or used any topicals prior to visit.  She denies any throat swelling, tongue swelling or any difficulty swallowing now or prior to visit.   She denies any sore throat or recent illness.   Working from home, denies any Covid contact or known exposure.   She denies any fever or illness, no cold symptoms.   She denies any new medications, or new foods she does not usually take.   She denies any contact with known poisonous plants. She does report she mowed the yard last night and she also reports she does have three dogs outside that she feels could have brought an allergen in.    She is able to swallow liquids and food without any difficulty.  Patient  denies any fever, body aches,chills,chest pain, shortness of breath, nausea, vomiting, or diarrhea.   Note: she does report that 3 months ago she had a reaction to an unknown  agent,she did feel as if she had throat swelling and intense itching at that time.Denies any other symptoms at that time. She did not take any medications at that time. She does have Atarax for anxiety but was not taking at the time.    Denies pregnancy LMP 06/15/19 Observations/Objective:  Patient is alert and oriented and responsive to questions Engages in conversation and looks directly at provider smiling and  Speaks in full sentences without any pauses without any shortness of breath or distress.   Video has poor lighting however provider is able to visualize resolving urticaria lower back has small erythematic wheals present as well as right side of forehead.  Patient reports it does blanch when she is asked to push on rash.  No lip swelling noted. She reports it is resolved.   She is in no acute distress on video and appears well.   Assessment and Plan:   Meds ordered this encounter  Medications  . cetirizine (ZYRTEC) 10 MG tablet    Sig: Take 1 tablet (10 mg total) by mouth daily.    Dispense:  90 tablet    Refill:  1  . EPINEPHrine 0.3 mg/0.3 mL IJ SOAJ injection    Sig: Inject 0.3 mLs (0.3 mg total) into the muscle as needed for anaphylaxis.    Dispense:  1 each    Refill:  1    2 pack please substitute for generic or lowest cost  version for patient.Call if needed  . predniSONE (STERAPRED UNI-PAK 21 TAB) 10 MG (21) TBPK tablet    Sig: PO: Take 6 tablets on day 1:Take 5 tablets day 2:Take 4 tablets day 3: Take 3 tablets day 4:Take 2 tablets day five: 5 Take 1 tablet day 6    Dispense:  21 tablet    Refill:  0    Follow Up Instructions: Benadryl 25 mg to 50 mg by mouth every 4 to 6 hours as needed for allergic reaction. Will cause drowsiness. ) keep liquid Benadryl or chewable benadryl with you at all times and take dose as above if needed.  Do not take Benadryl with Hydroxyzine can cause adverse affects .  Epi Pen sent in use as discussed/ prescribed.  Start Zyrtec 10 mg  daily.  Medications sent to pharmacy as discussed.  I do recommend that you contact your primary care for referral to allergy testing. Call the clinic if you have any questions.  Call 911 for emergency symptoms. Will include anaphylaxis handout as well with emergency symptoms as we discussed.  If any symptoms worsen seek in office visit.    After Visit Summary sent to patient.  I discussed the assessment and treatment plan with the patient. The patient was provided an opportunity to ask questions and all were answered. The patient agreed with the plan and demonstrated an understanding of the instructions.   The patient was advised to call back or seek an in-person evaluation if the symptoms worsen or if the condition fails to improve as anticipated.  I provided 25  minutes of non-face-to-face time during this encounter.   Marcille Buffy, FNP

## 2019-07-07 ENCOUNTER — Encounter: Payer: Self-pay | Admitting: Adult Health

## 2019-07-13 ENCOUNTER — Encounter: Payer: Self-pay | Admitting: Certified Nurse Midwife

## 2019-07-13 ENCOUNTER — Other Ambulatory Visit: Payer: Self-pay

## 2019-07-13 MED ORDER — SERTRALINE HCL 100 MG PO TABS: 100.0000 mg | ORAL_TABLET | Freq: Every day | ORAL | 9 refills | Status: DC

## 2019-09-08 ENCOUNTER — Encounter: Payer: Self-pay | Admitting: Certified Nurse Midwife

## 2019-09-20 ENCOUNTER — Encounter: Payer: Self-pay | Admitting: Adult Health

## 2019-09-21 ENCOUNTER — Other Ambulatory Visit: Payer: Self-pay

## 2019-09-21 ENCOUNTER — Ambulatory Visit: Payer: Managed Care, Other (non HMO) | Admitting: Adult Health

## 2019-09-21 ENCOUNTER — Encounter: Payer: Self-pay | Admitting: Adult Health

## 2019-09-21 VITALS — BP 140/80 | HR 93 | Temp 97.8°F | Resp 18

## 2019-09-21 DIAGNOSIS — T7840XD Allergy, unspecified, subsequent encounter: Secondary | ICD-10-CM | POA: Diagnosis not present

## 2019-09-21 DIAGNOSIS — R21 Rash and other nonspecific skin eruption: Secondary | ICD-10-CM | POA: Diagnosis not present

## 2019-09-21 DIAGNOSIS — L500 Allergic urticaria: Secondary | ICD-10-CM | POA: Insufficient documentation

## 2019-09-21 MED ORDER — PREDNISONE 10 MG (21) PO TBPK: ORAL_TABLET | ORAL | 0 refills | Status: DC

## 2019-09-21 MED ORDER — NYSTATIN 100000 UNIT/GM EX OINT: 1.0000 "application " | TOPICAL_OINTMENT | Freq: Two times a day (BID) | CUTANEOUS | 0 refills | Status: AC

## 2019-09-21 MED ORDER — EPINEPHRINE 0.3 MG/0.3ML IJ SOAJ: 0.3000 mg | INTRAMUSCULAR | 1 refills | Status: DC | PRN

## 2019-09-21 NOTE — Progress Notes (Signed)
Error

## 2019-09-21 NOTE — Patient Instructions (Addendum)
Referral made to Allergy testing/ allergist. I recommend getting your epinephrine pen and keep with you. Always have Benadryl with you take per package instructions no more than 50 mg orally per package instructions preferably get liquid or melt away tablets. Call 911 for emergency symptoms.  Orders Placed This Encounter  Procedures  . Ambulatory referral to Allergy    Referral Priority:   Urgent    Referral Type:   Allergy Testing    Referral Reason:   Specialty Services Required    Referred to Provider:   Marcelyn Bruins, MD    Requested Specialty:   Allergy    Number of Visits Requested:   1     Hives Hives are itchy, red, swollen areas on your skin. Hives can show up on any part of your body. Hives often fade within 24 hours (acute hives). New hives can show up after old ones fade. This can go on for many days or weeks (chronic hives). Hives do not spread from person to person (are not contagious). Hives are caused by your body's response to something that you are allergic to (allergen). These are sometimes called triggers. You can get hives right after being around a trigger, or hours later. What are the causes?  Allergies to foods.  Insect bites or stings.  Pollen.  Pets.  Latex.  Chemicals.  Spending time in sunlight, heat, or cold.  Exercise.  Stress.  Some medicines.  Viruses. This includes the common cold.  Infections caused by germs (bacteria).  Allergy shots.  Blood transfusions. Sometimes, the cause is not known. What increases the risk?  Being a woman.  Being allergic to foods such as: ? Citrus fruits. ? Milk. ? Eggs. ? Peanuts. ? Tree nuts. ? Shellfish.  Being allergic to: ? Medicines. ? Latex. ? Insects. ? Animals. ? Pollen. What are the signs or symptoms?   Raised, itchy, red or white bumps or patches on your skin. These areas may: ? Get large and swollen. ? Change in shape and location. ? Stand alone or connect to  each other over a large area of skin. ? Sting or hurt. ? Turn white when pressed in the center (blanch). In very bad cases, your hands, feet, and face may also get swollen. This may happen if hives start deeper in your skin. How is this treated? Treatment for this condition depends on your symptoms. Treatment may include:  Using cool, wet cloths (cool compresses) or taking cool showers to stop the itching.  Medicines that help: ? Relieve itching (antihistamines). ? Reduce swelling (corticosteroids). ? Treat infection (antibiotics).  A medicine (omalizumab) that is given as a shot (injection). Your doctor may prescribe this if you have hives that do not get better even after other treatments.  In very bad cases, you may need a shot of a medicine called epinephrineto prevent a life-threatening allergic reaction (anaphylaxis). Follow these instructions at home: Medicines  Take or apply over-the-counter and prescription medicines only as told by your doctor.  If you were prescribed an antibiotic medicine, use it as told by your doctor. Do not stop using it even if you start to feel better. Skin care  Apply cool, wet cloths to the hives.  Do not scratch your skin. Do not rub your skin. General instructions  Do not take hot showers or baths. This can make itching worse.  Do not wear tight clothes.  Use sunscreen and wear clothes that cover your skin when you are outside.  Avoid any triggers that cause your hives. Keep a journal to help track what causes your hives. Write down: ? What medicines you take. ? What you eat and drink. ? What products you use on your skin.  Keep all follow-up visits as told by your doctor. This is important. Contact a doctor if:  Your symptoms are not better with medicine.  Your joints hurt or are swollen. Get help right away if:  You have a fever.  You have pain in your belly (abdomen).  Your tongue or lips are swollen.  Your eyelids are  swollen.  Your chest or throat feels tight.  You have trouble breathing or swallowing. These symptoms may be an emergency. Do not wait to see if the symptoms will go away. Get medical help right away. Call your local emergency services (911 in the [place] Do not drive yourself to the hospital. Summary  Hives are itchy, red, swollen areas on your skin.  Treatment for this condition depends on your symptoms.  Avoid things that cause your hives. Keep a journal to help track what causes your hives.  Take and apply over-the-counter and prescription medicines only as told by your doctor.  Keep all follow-up visits as told by your doctor. This is important. This information is not intended to replace advice given to you by your health care provider. Make sure you discuss any questions you have with your health care provider. Document Released: 09/17/2008 Document Revised: 06/24/2018 Document Reviewed: 06/24/2018 Elsevier Patient Education  2020 [place] Dermatitis Dermatitis is redness, soreness, and swelling (inflammation) of the skin. Contact dermatitis is a reaction to something that touches the skin. There are two types of contact dermatitis: Irritant contact dermatitis. This happens when something bothers (irritates) your skin, like soap. Allergic contact dermatitis. This is caused when you are exposed to something that you are allergic to, such as poison ivy. What are the causes? Common causes of irritant contact dermatitis include: Makeup. Soaps. Detergents. Bleaches. Acids. Metals, such as nickel. Common causes of allergic contact dermatitis include: Plants. Chemicals. Jewelry. Latex. Medicines. Preservatives in products, such as clothing. What increases the risk? Having a job that exposes you to things that bother your skin. Having asthma or eczema. What are the signs or symptoms? Symptoms may happen anywhere the irritant has touched your skin. Symptoms  include: Dry or flaky skin. Redness. Cracks. Itching. Pain or a burning feeling. Blisters. Blood or clear fluid draining from skin cracks. With allergic contact dermatitis, swelling may occur. This may happen in places such as the eyelids, mouth, or genitals. How is this treated? This condition is treated by checking for the cause of the reaction and protecting your skin. Treatment may also include: Steroid creams, ointments, or medicines. Antibiotic medicines or other ointments, if you have a skin infection. Lotion or medicines to help with itching. A bandage (dressing). Follow these instructions at home: Skin care Moisturize your skin as needed. Put cool cloths on your skin. Put a baking soda paste on your skin. Stir water into baking soda until it looks like a paste. Do not scratch your skin. Avoid having things rub up against your skin. Avoid the use of soaps, perfumes, and dyes. Medicines Take or apply over-the-counter and prescription medicines only as told by your doctor. If you were prescribed an antibiotic medicine, take or apply it as told by your doctor. Do not stop using it even if your condition starts to get better. Bathing Take a bath with:  Epsom salts. Baking soda. Colloidal oatmeal. Bathe less often. Bathe in warm water. Avoid using hot water. Bandage care If you were given a bandage, change it as told by your health care provider. Wash your hands with soap and water before and after you change your bandage. If soap and water are not available, use hand sanitizer. General instructions Avoid the things that caused your reaction. If you do not know what caused it, keep a journal. Write down: What you eat. What skin products you use. What you drink. What you wear in the area that has symptoms. This includes jewelry. Check the affected areas every day for signs of infection. Check for: More redness, swelling, or pain. More fluid or blood. Warmth. Pus or a  bad smell. Keep all follow-up visits as told by your doctor. This is important. Contact a doctor if: You do not get better with treatment. Your condition gets worse. You have signs of infection, such as: More swelling. Tenderness. More redness. Soreness. Warmth. You have a fever. You have new symptoms. Get help right away if: You have a very bad headache. You have neck pain. Your neck is stiff. You throw up (vomit). You feel very sleepy. You see red streaks coming from the area. Your bone or joint near the area hurts after the skin has healed. The area turns darker. You have trouble breathing. Summary Dermatitis is redness, soreness, and swelling of the skin. Symptoms may occur where the irritant has touched you. Treatment may include medicines and skin care. If you do not know what caused your reaction, keep a journal. Contact a doctor if your condition gets worse or you have signs of infection. This information is not intended to replace advice given to you by your health care provider. Make sure you discuss any questions you have with your health care provider. Document Released: 10/06/2009 Document Revised: 03/31/2019 Document Reviewed: 06/24/2018 Elsevier Patient Education  2020 [employer].  How to Use an Auto-Injector Pen An auto-injector pen (pre-filled automatic epinephrine injection device) is a device that is used to deliver epinephrine to the body. Epinephrine is a medicine that is given as a shot (injection). It works by relaxing the muscles in the airways and tightening the blood vessels. It is used to treat:  A life-threatening allergic reaction (anaphylaxis).  Serious breathing problems, such as severe asthma attacks, some lung problems, and other emergency conditions. An epinephrine injection can save your life. You should always carry an auto-injector pen with you if you are at risk for severe asthma attacks or anaphylaxis. You may hear other names for an  auto-injector pen. They are epinephrine injection, epinephrine auto-injector pen, epinephrine pen, and automatic injection device. What are the risks? Using the auto-injector pen is safe. However, problems may arise, including:  Damage to bone or tissue. Make sure that you correctly place the needle in the muscle of your outer thigh as told by your health care provider. When should I use my auto-injector pen? Use your auto-injector pen as soon as you think you are experiencing anaphylaxis or a severe asthma attack. Anaphylaxis is very dangerous if it is not treated right away. Signs and symptoms of anaphylaxis may include:  Feeling warm in the face (flushed). This may include redness.  Itchy, red, swollen areas of skin (hives).  Swelling of the eyes, lips, face, mouth, tongue, or throat.  Difficulty breathing, speaking, or swallowing.  Noisy breathing (wheezing).  Dizziness or light-headedness.  Fainting.  Pain or cramping in the  abdomen.  Vomiting.  Diarrhea. These symptoms may represent a serious problem that is an emergency. Do not wait to see if the symptoms will go away. Use your auto-injector pen as you have been instructed, and get medical help right away. Call your local emergency services (911 in the [place] Do not drive yourself to the hospital. General tips for using an auto-injector pen   Use epinephrine exactly as told by your health care provider. Do not inject it more often or in greater or smaller doses than your health care provider told you. Most auto-injector pens contain one dose of epinephrine. Some contain two doses.  Use the auto-injector pen to give yourself an injection under your skin or into your muscle on the outer side of your thigh. Do not give yourself an injection into your buttocks or any other part of your body. ? In an emergency, you can use your auto-injector pen through your clothing. ? After you inject a dose of epinephrine, some liquid may  remain in your auto-injector pen. This is normal.  If you need to give yourself a second dose of epinephrine, give the second injection in another area on your outer thigh. Do not give two injections in exactly the same place on your body. This can lead to tissue damage.  From time to time: ? Check the expiration date on your auto-injector pen. ? Check the solution to ensure that it is not cloudy and that there are no particles floating in it. If your auto-injector pen is expired or if the solution is cloudy or has particles floating in it, throw it away and get a new one.  Ask your health care provider how to safely get rid of used or expired auto-injector pens.  Talk with your pharmacist or health care provider if you have questions about how to inject epinephrine correctly. Get help right away if:  You inject epinephrine. You may need additional medical care, and you may need to be monitored for the side effects of epinephrine. The side effects include: ? Difficulty breathing. ? Fast or irregular heartbeat. ? Nausea or vomiting. ? Sweating. ? Dizziness. ? Nervousness or anxiety. ? Weakness. ? Pale skin. ? Headache. ? Shaking that does not stop. Summary  An auto-injector pen (pre-filled automatic epinephrine injection device) is a device that is used to deliver epinephrine to the body.  An auto-injector pen is used to treat a life-threatening allergic reaction (anaphylaxis), asthma attack, or other emergency conditions.  You should always carry an auto-injector pen with you if you are at risk for anaphylaxis or severe asthma attacks.  Use of this device is safe. However, bone or tissue damage can occur if you do not follow instructions for injecting the medicine.  Talk with your pharmacist or health care provider if you have questions about how to inject epinephrine correctly. This information is not intended to replace advice given to you by your health care provider. Make sure  you discuss any questions you have with your health care provider. Document Released: 12/06/2000 Document Revised: 01/20/2019 Document Reviewed: 01/20/2019 Elsevier Patient Education  2020 [employer]. Epinephrine injection (Auto-injector) What is this medicine? EPINEPHRINE (ep i NEF rin) is used for the emergency treatment of severe allergic reactions. You should keep this medicine with you at all times. This medicine may be used for other purposes; ask your health care provider or pharmacist if you have questions. COMMON BRAND NAME(S): Adrenaclick, Auvi-Q, epinephrinesnap, epinephrinesnap-v, EpiPen, EPIsnap Epinephrine, SYMJEPI, Twinject What should  I tell my health care provider before I take this medicine? They need to know if you have any of the following conditions:  diabetes  heart disease  high blood pressure  lung or breathing disease, like asthma  Parkinson's disease  thyroid disease  an unusual or allergic reaction to epinephrine, sulfites, other medicines, foods, dyes, or preservatives  pregnant or trying to get pregnant  breast-feeding How should I use this medicine? This medicine is for injection into the outer thigh. Your doctor or health care professional will instruct you on the proper use of the device during an emergency. Read all directions carefully and make sure you understand them. Do not use more often than directed. Talk to your pediatrician regarding the use of this medicine in children. Special care may be needed. This drug is commonly used in children. A special device is available for use in children. If you are giving this medicine to a young child, hold their leg firmly in place before and during the injection to prevent injury. Overdosage: If you think you have taken too much of this medicine contact a poison control center or emergency room at once. NOTE: This medicine is only for you. Do not share this medicine with others. What if I miss a dose?  This does not apply. You should only use this medicine for an allergic reaction. What may interact with this medicine? This medicine is only used during an emergency. Significant drug interactions are not likely during emergency use. This list may not describe all possible interactions. Give your health care provider a list of all the medicines, herbs, non-prescription drugs, or dietary supplements you use. Also tell them if you smoke, drink alcohol, or use illegal drugs. Some items may interact with your medicine. What should I watch for while using this medicine? Keep this medicine ready for use in the case of a severe allergic reaction. Make sure that you have the phone number of your doctor or health care professional and local hospital ready. Remember to check the expiration date of your medicine regularly. You may need to have additional units of this medicine with you at work, school, or other places. Talk to your doctor or health care professional about your need for extra units. Some emergencies may require an additional dose. Check with your doctor or a health care professional before using an extra dose. After use, go to the nearest hospital or call 911. Avoid physical activity. Make sure the treating health care professional knows you have received an injection of this medicine. You will receive additional instructions on what to do during and after use of this medicine before a medical emergency occurs. What side effects may I notice from receiving this medicine? Side effects that you should report to your doctor or health care professional as soon as possible:  allergic reactions like skin rash, itching or hives, swelling of the face, lips, or tongue  breathing problems  chest pain  fast, irregular heartbeat  pain, tingling, numbness in the hands or feet  pain, redness, or irritation at site where injected  vomiting Side effects that usually do not require medical attention  (report to your doctor or health care professional if they continue or are bothersome):  anxious  dizziness  dry mouth  headache  increased sweating  nausea  unusually weak or tired This list may not describe all possible side effects. Call your doctor for medical advice about side effects. You may report side effects to FDA at  1-800-FDA-1088. Where should I keep my medicine? Keep out of the reach of children. Store at room temperature between 15 and 30 degrees C (59 and 86 degrees F). Protect from light and heat. The solution should be clear in color. If the solution is discolored or contains particles it must be replaced. Throw away any unused medicine after the expiration date. Ask your doctor or pharmacist about proper disposal of the injector if it is expired or has been used. Always replace your auto-injector before it expires. NOTE: This sheet is a summary. It may not cover all possible information. If you have questions about this medicine, talk to your doctor, pharmacist, or health care provider.  2020 Elsevier/Gold Standard (2015-05-15 12:24:50)  Diphenhydramine capsules or tablets What is this medicine? DIPHENHYDRAMINE (dye fen HYE dra meen) is an antihistamine. It is used to treat the symptoms of an allergic reaction. It is also used to treat Parkinson's disease. This medicine is also used to prevent and to treat motion sickness and as a nighttime sleep aid. This medicine may be used for other purposes; ask your health care provider or pharmacist if you have questions. COMMON BRAND NAME(S): Alka-Seltzer Plus Allergy, Aller-G-Time, Banophen, Benadryl Allergy, Benadryl Allergy Dye Free, Benadryl Allergy Kapgel, Benadryl Allergy Ultratab, Diphedryl, Diphenhist, Genahist, Geri-Dryl, PHARBEDRYL, Q-Dryl, Veto KempsSleepinal, Valu-Dryl, Vicks ZzzQuil Nightime Sleep-Aid What should I tell my health care provider before I take this medicine? They need to know if you have any of these  conditions:  asthma or lung disease  glaucoma  high blood pressure or heart disease  liver disease  pain or difficulty passing urine  prostate trouble  ulcers or other stomach problems  an unusual or allergic reaction to diphenhydramine, other medicines foods, dyes, or preservatives such as sulfites  pregnant or trying to get pregnant  breast-feeding How should I use this medicine? Take this medicine by mouth with a full glass of water. Follow the directions on the prescription label. Take your doses at regular intervals. Do not take your medicine more often than directed. To prevent motion sickness start taking this medicine 30 to 60 minutes before you leave. Talk to your pediatrician regarding the use of this medicine in children. Special care may be needed. Patients over 24 years old may have a stronger reaction and need a smaller dose. Overdosage: If you think you have taken too much of this medicine contact a poison control center or emergency room at once. NOTE: This medicine is only for you. Do not share this medicine with others. What if I miss a dose? If you miss a dose, take it as soon as you can. If it is almost time for your next dose, take only that dose. Do not take double or extra doses. What may interact with this medicine? Do not take this medicine with any of the following medications:  MAOIs like Carbex, Eldepryl, Marplan, Nardil, and Parnate This medicine may also interact with the following medications:  alcohol  barbiturates, like phenobarbital  medicines for bladder spasm like oxybutynin, tolterodine  medicines for blood pressure  medicines for depression, anxiety, or psychotic disturbances  medicines for movement abnormalities or Parkinson's disease  medicines for sleep  other medicines for cold, cough or allergy  some medicines for the stomach like chlordiazepoxide, dicyclomine This list may not describe all possible interactions. Give your  health care provider a list of all the medicines, herbs, non-prescription drugs, or dietary supplements you use. Also tell them if you smoke, drink alcohol, or  use illegal drugs. Some items may interact with your medicine. What should I watch for while using this medicine? Visit your doctor or health care professional for regular check ups. Tell your doctor if your symptoms do not improve or if they get worse. Your mouth may get dry. Chewing sugarless gum or sucking hard candy, and drinking plenty of water may help. Contact your doctor if the problem does not go away or is severe. This medicine may cause dry eyes and blurred vision. If you wear contact lenses you may feel some discomfort. Lubricating drops may help. See your eye doctor if the problem does not go away or is severe. You may get drowsy or dizzy. Do not drive, use machinery, or do anything that needs mental alertness until you know how this medicine affects you. Do not stand or sit up quickly, especially if you are an older patient. This reduces the risk of dizzy or fainting spells. Alcohol may interfere with the effect of this medicine. Avoid alcoholic drinks. What side effects may I notice from receiving this medicine? Side effects that you should report to your doctor or health care professional as soon as possible:  allergic reactions like skin rash, itching or hives, swelling of the face, lips, or tongue  changes in vision  confused, agitated, nervous  irregular or fast heartbeat  tremor  trouble passing urine  unusual bleeding or bruising  unusually weak or tired Side effects that usually do not require medical attention (report to your doctor or health care professional if they continue or are bothersome):  constipation, diarrhea  drowsy  headache  loss of appetite  stomach upset, vomiting  thick mucous This list may not describe all possible side effects. Call your doctor for medical advice about side  effects. You may report side effects to FDA at 1-800-FDA-1088. Where should I keep my medicine? Keep out of the reach of children. Store at room temperature between 15 and 30 degrees C (59 and 86 degrees F). Keep container closed tightly. Throw away any unused medicine after the expiration date. NOTE: This sheet is a summary. It may not cover all possible information. If you have questions about this medicine, talk to your doctor, pharmacist, or health care provider.  2020 Elsevier/Gold Standard (2008-03-28 17:06:22)   Cetirizine tablets What is this medicine? CETIRIZINE (se TI ra zeen) is an antihistamine. This medicine is used to treat or prevent symptoms of allergies. It is also used to help reduce itchy skin rash and hives. This medicine may be used for other purposes; ask your health care provider or pharmacist if you have questions. COMMON BRAND NAME(S): All Day Allergy, Allergy Relief, Zyrtec, Zyrtec Hives Relief What should I tell my health care provider before I take this medicine? They need to know if you have any of these conditions:  kidney disease  liver disease  an unusual or allergic reaction to cetirizine, hydroxyzine, other medicines, foods, dyes, or preservatives  pregnant or trying to get pregnant  breast-feeding How should I use this medicine? Take this medicine by mouth with a glass of water. Follow the directions on the prescription label. You can take this medicine with food or on an empty stomach. Take your medicine at regular times. Do not take more often than directed. You may need to take this medicine for several days before your symptoms improve. Talk to your pediatrician regarding the use of this medicine in children. Special care may be needed. While this drug may be prescribed  for children as young as 26 years of age for selected conditions, precautions do apply. Overdosage: If you think you have taken too much of this medicine contact a poison control  center or emergency room at once. NOTE: This medicine is only for you. Do not share this medicine with others. What if I miss a dose? If you miss a dose, take it as soon as you can. If it is almost time for your next dose, take only that dose. Do not take double or extra doses. What may interact with this medicine?  alcohol  certain medicines for anxiety or sleep  narcotic medicines for pain  other medicines for colds or allergies This list may not describe all possible interactions. Give your health care provider a list of all the medicines, herbs, non-prescription drugs, or dietary supplements you use. Also tell them if you smoke, drink alcohol, or use illegal drugs. Some items may interact with your medicine. What should I watch for while using this medicine? Visit your doctor or health care professional for regular checks on your health. Tell your doctor if your symptoms do not improve. You may get drowsy or dizzy. Do not drive, use machinery, or do anything that needs mental alertness until you know how this medicine affects you. Do not stand or sit up quickly, especially if you are an older patient. This reduces the risk of dizzy or fainting spells. Your mouth may get dry. Chewing sugarless gum or sucking hard candy, and drinking plenty of water may help. Contact your doctor if the problem does not go away or is severe. What side effects may I notice from receiving this medicine? Side effects that you should report to your doctor or health care professional as soon as possible:  allergic reactions like skin rash, itching or hives, swelling of the face, lips, or tongue  changes in vision or hearing  fast or irregular heartbeat  trouble passing urine or change in the amount of urine Side effects that usually do not require medical attention (report to your doctor or health care professional if they continue or are bothersome):  dizziness  dry mouth  irritability  sore throat   stomach pain  tiredness This list may not describe all possible side effects. Call your doctor for medical advice about side effects. You may report side effects to FDA at 1-800-FDA-1088. Where should I keep my medicine? Keep out of the reach of children. Store at room temperature between 15 and 30 degrees C (59 and 86 degrees F). Throw away any unused medicine after the expiration date. NOTE: This sheet is a summary. It may not cover all possible information. If you have questions about this medicine, talk to your doctor, pharmacist, or health care provider.  2020 Elsevier/Gold Standard (2015-01-03 13:44:42)

## 2019-09-21 NOTE — Progress Notes (Signed)
[facility]  Subjective:     Patient ID: Dana Hood, female   DOB: July 28, 1996, 23 y.o.   MRN: [id number]  HPI   Patient is a 23 year old female in no acute distress who comes to the clinic for a history of allergic reactions. She was seen virtually on 07/06/19 for a an allergic reaction that resolved completely with Benadryl, prednisone dose pack and she also added Zyrtec daily at that time as well. She had mild lower lip swelling at that time, itching on the skin and rash widespread. She had no illness symptoms at that time.   She reports she had one visit in August and had mild rash with mild throat itching that benadryl resolved.   She also had another episode last night on 09/20/19 with rash on torso / abdomen and upper thighs that resolved again with benadryl. She had no other symptoms at that time.   She has no known allergies. She denies exposure to anything new, no new detergents, soaps. Denies any trauma.    Patient  denies any fever, body aches,chills, chest pain, shortness of breath, nausea, vomiting, or diarrhea.     Review of Systems  Constitutional: Negative for activity change, appetite change, chills, diaphoresis, fatigue, fever and unexpected weight change.  HENT: Negative.   Respiratory: Negative.  Negative for apnea, cough, choking, chest tightness, shortness of breath, wheezing and stridor.   Cardiovascular: Negative for chest pain, palpitations and leg swelling.  Gastrointestinal: Negative.   Genitourinary: Negative.   Musculoskeletal: Negative.   Skin: Positive for rash. Negative for color change, pallor and wound.  Allergic/Immunologic: Negative for environmental allergies, food allergies and immunocompromised state.  Neurological: Negative.   Hematological: Negative.   Psychiatric/Behavioral: Negative.        Objective:   Physical Exam Vitals signs reviewed.  Constitutional:      General: She is not in  acute distress.    Appearance: Normal appearance. She is not ill-appearing, toxic-appearing or diaphoretic.  HENT:     Head: Normocephalic.     Right Ear: Tympanic membrane normal.     Left Ear: Tympanic membrane normal.     Nose: Nose normal. No congestion or rhinorrhea.     Mouth/Throat:     Mouth: Mucous membranes are moist.     Pharynx: Oropharynx is clear. No oropharyngeal exudate or posterior oropharyngeal erythema.     Comments: Oral patent airway  Eyes:     Extraocular Movements: Extraocular movements intact.     Conjunctiva/sclera: Conjunctivae normal.     Pupils: Pupils are equal, round, and reactive to light.  Neck:     Musculoskeletal: Normal range of motion and neck supple. No neck rigidity or muscular tenderness.  Cardiovascular:     Rate and Rhythm: Normal rate and regular rhythm.     Pulses: Normal pulses.     Heart sounds: Normal heart sounds. No murmur. No friction rub. No gallop.   Pulmonary:     Effort: Pulmonary effort is normal.     Breath sounds: Normal breath sounds.  Abdominal:     General: Bowel sounds are normal. There is no distension.     Palpations: Abdomen is soft. There is no mass.  Musculoskeletal: Normal range of motion.  Lymphadenopathy:     Cervical: No cervical adenopathy.  Skin:    General: Skin is warm and dry.     Capillary Refill: Capillary refill takes less than 2 seconds.  Findings: Rash (small area on upper chest appears fungal with 1cm x 1 cm circular mild erythema  central clearing ) present. No erythema.       Neurological:     Mental Status: She is alert and oriented to person, place, and time.  Psychiatric:        Mood and Affect: Mood normal.        Behavior: Behavior normal.        Thought Content: Thought content normal.        Judgment: Judgment normal.        Assessment:     Allergic reaction, subsequent encounter- history of  - Plan: Ambulatory referral to Allergy  Skin rash      Plan:     Meds ordered  this encounter  Medications  . nystatin ointment (MYCOSTATIN)    Sig: Apply 1 application topically 2 (two) times daily for 15 days. To rash on upper chest    Dispense:  30 g    Refill:  0  . predniSONE (STERAPRED UNI-PAK 21 TAB) 10 MG (21) TBPK tablet    Sig: PO: Take 6 tablets on day 1:Take 5 tablets day 2:Take 4 tablets day 3: Take 3 tablets day 4:Take 2 tablets day five: 5 Take 1 tablet day 6    Dispense:  21 tablet    Refill:  0  . EPINEPHrine (ADRENACLICK) 0.3 mg/0.3 mL IJ SOAJ injection    Sig: Inject 0.3 mLs (0.3 mg total) into the muscle as needed for anaphylaxis.    Dispense:  1 each    Refill:  1    Please help patient with drug card. She really needs this and the other one was to expensive. If another is cheaper on her plan please call me at [phone] and I will change.    RED FLAGS and anaphylaxis and when to call 911.    Advised patient call the office or your primary care doctor for an appointment if no improvement within 72 hours or if any symptoms change or worsen at any time  Advised ER or urgent Care if after hours or on weekend. Call 911 for emergency symptoms at any time.Patinet verbalized understanding of all instructions given/reviewed and treatment plan and has no further questions or concerns at this time.

## 2019-10-11 ENCOUNTER — Other Ambulatory Visit: Payer: Self-pay

## 2019-10-11 ENCOUNTER — Encounter: Payer: Self-pay | Admitting: Adult Health

## 2019-10-11 ENCOUNTER — Ambulatory Visit: Payer: Managed Care, Other (non HMO) | Admitting: Adult Health

## 2019-10-11 DIAGNOSIS — Z7189 Other specified counseling: Secondary | ICD-10-CM

## 2019-10-11 DIAGNOSIS — R05 Cough: Secondary | ICD-10-CM

## 2019-10-11 DIAGNOSIS — Z20828 Contact with and (suspected) exposure to other viral communicable diseases: Secondary | ICD-10-CM

## 2019-10-11 DIAGNOSIS — J029 Acute pharyngitis, unspecified: Secondary | ICD-10-CM

## 2019-10-11 DIAGNOSIS — Z20822 Contact with and (suspected) exposure to covid-19: Secondary | ICD-10-CM

## 2019-10-11 MED ORDER — AZITHROMYCIN 250 MG PO TABS: ORAL_TABLET | ORAL | 0 refills | Status: DC

## 2019-10-11 NOTE — Progress Notes (Signed)
Virtual Visit via Telephone Note  I connected with Dana Hood on 10/11/19 at  1:00 PM EDT by telephone and verified that I am speaking with the correct person using two identifiers.  Location: Patient: at home  Provider: [facility], [place], [place]    I discussed the limitations, risks, security and privacy concerns of performing an evaluation and management service by telephone and the availability of in person appointments. I also discussed with the patient that there may be a patient responsible charge related to this service. The patient expressed understanding and agreed to proceed.   History of Present Illness: No Known Allergies  Patient Active Problem List   Diagnosis Date Noted  . Allergic urticaria history of and currently resolved  09/21/2019  . Depression 11/24/2017  . Migraines 01/30/2017    Current Outpatient Medications:  .  cetirizine (ZYRTEC) 10 MG tablet, Take 1 tablet (10 mg total) by mouth daily., Disp: 90 tablet, Rfl: 1 .  EPINEPHrine (ADRENACLICK) 0.3 QV/9.5 mL IJ SOAJ injection, Inject 0.3 mLs (0.3 mg total) into the muscle as needed for anaphylaxis., Disp: 1 each, Rfl: 1 .  etonogestrel-ethinyl estradiol (ELURYNG) 0.12-0.015 MG/24HR vaginal ring, Insert vaginally and leave in place for 3 consecutive weeks, then remove for 1 week., Disp: 1 each, Rfl: 12 .  fluticasone (FLONASE) 50 MCG/ACT nasal spray, Place 2 sprays into both nostrils daily. (Patient taking differently: Place 2 sprays into both nostrils as needed. ), Disp: 16 g, Rfl: 6 .  sertraline (ZOLOFT) 100 MG tablet, Take 1 tablet (100 mg total) by mouth daily., Disp: 30 tablet, Rfl: 9 Patient is a 23 year old female in no acute distress who calls the clinic after she was sent home from work today 10/11/2019 for illness that she reports started last Wednesday 10/06/2019. Virtual/ Telephone visit due to Covid 19 office protocol- she is aware she can always go  for an in person evaluation as well at facilities seeing in person patients with covid symptoms.  Productive yellow/ brown tinge sputum. Denies any blood in sputum. Denies any pain.   Symptoms reported started with sore throat, chest congestion.  Today has been one of her better days. Mild headache the entire time and no headache today.  Denies any pain with breathing, denies shortness of breath.  Denies any fever. Ibuprofen the past three days took 3 tablets in the morning everyday.  Denies any cough/ cold medications.  Denies any body aches.   Her son was sick last week with cough/ cold as well as her husband last week. Mild fatigue   Patient  denies any  rash, chest pain, shortness of breath, nausea, vomiting, or diarrhea.       Observations/Objective: Temperature: afebrile    No other vital signs available per patient.    Patient is alert and oriented and responsive to questions Engages in conversation with provider. Speaks in full sentences without any pauses without any shortness of breath or distress.   Assessment and Plan: 1. Suspected COVID-19 virus infection   2. Advice given about COVID-19 virus by telephone   3. Cough   4. Sorethroat     Follow Up Instructions: Return to work date of 10/21/19 if fever free without fever reducers and all other symptoms resolving.   She was advised to go for Covid testing at testing site visitors entrance of [facility]- stay in car for drive thru and wear a mask. Results are released to MyChart and patient  should call the clinic if she receives a positive test. She went for testing this morning on her way home from work as advised when she called the clinic to schedule a telephone visit this morning 10/11/2019.   Advised patient call the office or your primary care doctor for an appointment if no improvement within 72 hours or if any symptoms change or worsen at any time  Advised ER or urgent Care if after hours or on  weekend. Call 911 for emergency symptoms at any time.Patinet verbalized understanding of all instructions given/reviewed and treatment plan and has no further questions or concerns at this time.     I discussed the assessment and treatment plan with the patient. The patient was provided an opportunity to ask questions and all were answered. The patient agreed with the plan and demonstrated an understanding of the instructions.   The patient was advised to call back or seek an in-person evaluation if the symptoms worsen or if the condition fails to improve as anticipated.  I provided 15 minutes of non-face-to-face time during this encounter.   Jairo Ben, FNP

## 2019-10-11 NOTE — Patient Instructions (Signed)
Do not hesitate to be seen in person if any symptoms change or worsen at anytime- [facility] is seeing respiratory symptoms in person. Emergency room for emergencies.   Meds ordered this encounter  Medications  . azithromycin (ZITHROMAX) 250 MG tablet    Sig: By mouth Take 2 tablets day 1 (500mg  total) and 1 tablet ( 250 mg ) on days 2,3,4,5.    Dispense:  6 tablet    Refill:  0     Cough, Adult A cough helps to clear your throat and lungs. A cough may be a sign of an illness or another medical condition. An acute cough may only last 2-3 weeks, while a chronic cough may last 8 or more weeks. Many things can cause a cough. They include:  Germs (viruses or bacteria) that attack the airway.  Breathing in things that bother (irritate) your lungs.  Allergies.  Asthma.  Mucus that runs down the back of your throat (postnasal drip).  Smoking.  Acid backing up from the stomach into the tube that moves food from the mouth to the stomach (gastroesophageal reflux).  Some medicines.  Lung problems.  Other medical conditions, such as heart failure or a blood clot in the lung (pulmonary embolism). Follow these instructions at home: Medicines  Take over-the-counter and prescription medicines only as told by your doctor.  Talk with your doctor before you take medicines that stop a cough (coughsuppressants). Lifestyle   Do not smoke, and try not to be around smoke. Do not use any products that contain nicotine or tobacco, such as cigarettes, e-cigarettes, and chewing tobacco. If you need help quitting, ask your doctor.  Drink enough fluid to keep your pee (urine) pale yellow.  Avoid caffeine.  Do not drink alcohol if your doctor tells you not to drink. General instructions   Watch for any changes in your cough. Tell your doctor about them.  Always cover your mouth when you cough.  Stay away from things that make you cough, such as perfume, candles, campfire smoke,  or cleaning products.  If the air is dry, use a cool mist vaporizer or humidifier in your home.  If your cough is worse at night, try using extra pillows to raise your head up higher while you sleep.  Rest as needed.  Keep all follow-up visits as told by your doctor. This is important. Contact a doctor if:  You have new symptoms.  You cough up pus.  Your cough does not get better after 2-3 weeks, or your cough gets worse.  Cough medicine does not help your cough and you are not sleeping well.  You have pain that gets worse or pain that is not helped with medicine.  You have a fever.  You are losing weight and you do not know why.  You have night sweats. Get help right away if:  You cough up blood.  You have trouble breathing.  Your heartbeat is very fast. These symptoms may be an emergency. Do not wait to see if the symptoms will go away. Get medical help right away. Call your local emergency services (911 in the [place] Do not drive yourself to the hospital. Summary  A cough helps to clear your throat and lungs. Many things can cause a cough.  Take over-the-counter and prescription medicines only as told by your doctor.  Always cover your mouth when you cough.  Contact a doctor if you have new symptoms or you have a cough that does not  get better or gets worse. This information is not intended to replace advice given to you by your health care provider. Make sure you discuss any questions you have with your health care provider. Document Released: 08/22/2011 Document Revised: 12/28/2018 Document Reviewed: 12/28/2018 Elsevier Patient Education  2020 Elsevier Inc. Azithromycin tablets What is this medicine? AZITHROMYCIN (az ith roe MYE sin) is a macrolide antibiotic. It is used to treat or prevent certain kinds of bacterial infections. It will not work for colds, flu, or other viral infections. This medicine may be used for other purposes; ask your health care provider  or pharmacist if you have questions. COMMON BRAND NAME(S): Zithromax, Zithromax Tri-Pak, Zithromax Z-Pak What should I tell my health care provider before I take this medicine? They need to know if you have any of these conditions:  history of blood diseases, like leukemia  history of irregular heartbeat  kidney disease  liver disease  myasthenia gravis  an unusual or allergic reaction to azithromycin, erythromycin, other macrolide antibiotics, foods, dyes, or preservatives  pregnant or trying to get pregnant  breast-feeding How should I use this medicine? Take this medicine by mouth with a full glass of water. Follow the directions on the prescription label. The tablets can be taken with food or on an empty stomach. If the medicine upsets your stomach, take it with food. Take your medicine at regular intervals. Do not take your medicine more often than directed. Take all of your medicine as directed even if you think your are better. Do not skip doses or stop your medicine early. Talk to your pediatrician regarding the use of this medicine in children. While this drug may be prescribed for children as young as 6 months for selected conditions, precautions do apply. Overdosage: If you think you have taken too much of this medicine contact a poison control center or emergency room at once. NOTE: This medicine is only for you. Do not share this medicine with others. What if I miss a dose? If you miss a dose, take it as soon as you can. If it is almost time for your next dose, take only that dose. Do not take double or extra doses. What may interact with this medicine? Do not take this medicine with any of the following medications:  cisapride  dronedarone  pimozide  thioridazine This medicine may also interact with the following medications:  antacids that contain aluminum or magnesium  birth control pills  colchicine  cyclosporine  digoxin  ergot alkaloids like  dihydroergotamine, ergotamine  nelfinavir  other medicines that prolong the QT interval (an abnormal heart rhythm)  phenytoin  warfarin This list may not describe all possible interactions. Give your health care provider a list of all the medicines, herbs, non-prescription drugs, or dietary supplements you use. Also tell them if you smoke, drink alcohol, or use illegal drugs. Some items may interact with your medicine. What should I watch for while using this medicine? Tell your doctor or healthcare provider if your symptoms do not start to get better or if they get worse. This medicine may cause serious skin reactions. They can happen weeks to months after starting the medicine. Contact your healthcare provider right away if you notice fevers or flu-like symptoms with a rash. The rash may be red or purple and then turn into blisters or peeling of the skin. Or, you might notice a red rash with swelling of the face, lips or lymph nodes in your neck or under your arms.  Do not treat diarrhea with over the counter products. Contact your doctor if you have diarrhea that lasts more than 2 days or if it is severe and watery. This medicine can make you more sensitive to the sun. Keep out of the sun. If you cannot avoid being in the sun, wear protective clothing and use sunscreen. Do not use sun lamps or tanning beds/booths. What side effects may I notice from receiving this medicine? Side effects that you should report to your doctor or health care professional as soon as possible:  allergic reactions like skin rash, itching or hives, swelling of the face, lips, or tongue  bloody or watery diarrhea  breathing problems  chest pain  fast, irregular heartbeat  muscle weakness  rash, fever, and swollen lymph nodes  redness, blistering, peeling, or loosening of the skin, including inside the mouth  signs and symptoms of liver injury like dark yellow or brown urine; general ill feeling or  flu-like symptoms; light-colored stools; loss of appetite; nausea; right upper belly pain; unusually weak or tired; yellowing of the eyes or skin  white patches or sores in the mouth  unusually weak or tired Side effects that usually do not require medical attention (report to your doctor or health care professional if they continue or are bothersome):  diarrhea  nausea  stomach pain  vomiting This list may not describe all possible side effects. Call your doctor for medical advice about side effects. You may report side effects to FDA at 1-800-FDA-1088. Where should I keep my medicine? Keep out of the reach of children. Store at room temperature between 15 and 30 degrees C (59 and 86 degrees F). Throw away any unused medicine after the expiration date. NOTE: This sheet is a summary. It may not cover all possible information. If you have questions about this medicine, talk to your doctor, pharmacist, or health care provider.  2020 Elsevier/Gold Standard (2019-03-18 17:19:20) Hello [name],  You are being placed in the home monitoring program for COVID-19 (commonly known as Coronavirus).  This is because you are suspected to have the virus or are known to have the virus.  If you are unsure which group you fall into call your clinic.    As part of this program, you'll answer a daily questionnaire in the MyChart mobile app. You'll receive a notification through the MyChart app when the questionnaire is available. When you log in to MyChart, you'll see the tasks in your To Do activity.       Clinicians will see any answers that are concerning and take appropriate steps.  If at any point you are having a medical emergency, call 911.  If otherwise concerned call your clinic instead of coming into the clinic or hospital.  To keep from spreading the disease you should: Stay home and limit contact with other people as much as possible.  Wash your hands frequently. Cover your coughs and  sneezes with a tissue, and throw used tissues in the trash.   Clean and disinfect frequently touched surfaces and objects.    Take care of yourself by: Staying home Resting Drinking fluids Take fever-reducing medications (Tylenol/Acetaminophen and Ibuprofen)  For more information on the disease go to the Centers for Disease Control and Prevention website

## 2019-10-13 LAB — NOVEL CORONAVIRUS, NAA: SARS-CoV-2, NAA: NOT DETECTED

## 2019-10-19 ENCOUNTER — Encounter (INDEPENDENT_AMBULATORY_CARE_PROVIDER_SITE_OTHER): Payer: Self-pay

## 2019-10-20 ENCOUNTER — Telehealth: Payer: Self-pay

## 2019-10-20 ENCOUNTER — Other Ambulatory Visit: Payer: Self-pay

## 2019-10-20 ENCOUNTER — Ambulatory Visit (INDEPENDENT_AMBULATORY_CARE_PROVIDER_SITE_OTHER): Payer: Managed Care, Other (non HMO) | Admitting: Allergy

## 2019-10-20 ENCOUNTER — Encounter: Payer: Self-pay | Admitting: Allergy

## 2019-10-20 VITALS — BP 142/100 | HR 91 | Temp 98.6°F | Resp 16 | Ht 60.0 in | Wt 149.0 lb

## 2019-10-20 DIAGNOSIS — T783XXD Angioneurotic edema, subsequent encounter: Secondary | ICD-10-CM | POA: Diagnosis not present

## 2019-10-20 DIAGNOSIS — L508 Other urticaria: Secondary | ICD-10-CM

## 2019-10-20 MED ORDER — MONTELUKAST SODIUM 10 MG PO TABS: 10.0000 mg | ORAL_TABLET | Freq: Every day | ORAL | 5 refills | Status: DC

## 2019-10-20 NOTE — Telephone Encounter (Signed)
Emergency action plan has been sent to patient via mail.

## 2019-10-20 NOTE — Progress Notes (Signed)
New Patient Note  RE: Dana Hood MRN: [id number] DOB: 10/17/1996 Date of Office Visit: 10/20/2019  Referring provider: Sharmon Leyden* Primary care provider: Patient, No Pcp Per  Chief Complaint: hives  History of present illness: Dana Hood is a 23 y.o. female presenting today for consultation for hives.   She has been having hives for the past year.  She states the first time a year ago she was itching all over including inside her mouth.  She took benadryl for this initially.   She states after this she would wake up in the middle of the night with hives all over her body with a lot of itchy.   She states she would have these episodes early about once a month.  The last hive episode she had was about a month ago.  She has had scars left behind once resolved.  Hives resolve within 24-48 hours.  She has noted swelling of ears.  No joint aches/pain.   She does use benadryl to help with the itching.   She also has seen her PCP for her hives.  She has been treated with prednisone course and was prescribed an Epipen.   She now is taking zyrtec tablet daily.  She also is taking pepcid daily for reflux control.  She has not been able to identify any consistent triggers.  She has 4 dogs at home that are inside/outside dogs.  She states she keeps her house clean.   No history asthma, eczema, food allergy.  She does report seasonal allergies with spring and fall and prone to get sinus infections about once a year.  She reports nasal congestion, sneezing primarily.     Review of systems: Review of Systems  Constitutional: Negative for chills, fever and malaise/fatigue.  HENT: Negative for congestion, ear discharge, nosebleeds and sore throat.   Eyes: Negative for pain, discharge and redness.  Respiratory: Negative.   Cardiovascular: Negative.   Gastrointestinal: Negative.   Musculoskeletal: Negative.   Skin: Positive for itching and rash.  Neurological:  Negative.     All other systems negative unless noted above in HPI  Past medical history: Past Medical History:  Diagnosis Date  . Anxiety     Past surgical history: Past Surgical History:  Procedure Laterality Date  . NO PAST SURGERIES      Family history:  Family History  Problem Relation Age of Onset  . Hyperlipidemia Mother   . Hypertension Mother   . Migraines Mother   . Heart failure Maternal Grandfather   . Rheum arthritis Maternal Uncle   . Breast cancer Neg Hx   . Ovarian cancer Neg Hx   . Colon cancer Neg Hx     Social history: Lives in a house with carpeting with electric heating and wood heating and central cooling.  Dogs in the home.  No concern for water damage, mildew or roaches in the home.  Works as Engineer, drilling.  Smokes cigarettes 3 per day since 2015.   Medication List: Current Outpatient Medications  Medication Sig Dispense Refill  . cetirizine (ZYRTEC) 10 MG tablet Take 1 tablet (10 mg total) by mouth daily. 90 tablet 1  . EPINEPHrine (ADRENACLICK) 0.3 YQ/6.5 mL IJ SOAJ injection Inject 0.3 mLs (0.3 mg total) into the muscle as needed for anaphylaxis. 1 each 1  . etonogestrel-ethinyl estradiol (ELURYNG) 0.12-0.015 MG/24HR vaginal ring Insert vaginally and leave in place for 3 consecutive weeks, then remove for 1 week. 1  each 12  . famotidine (PEPCID) 20 MG tablet Take 20 mg by mouth 2 (two) times daily.    . sertraline (ZOLOFT) 100 MG tablet Take 1 tablet (100 mg total) by mouth daily. 30 tablet 9  . montelukast (SINGULAIR) 10 MG tablet Take 1 tablet (10 mg total) by mouth at bedtime. 30 tablet 5   No current facility-administered medications for this visit.     Known medication allergies: No Known Allergies   Physical examination: Blood pressure (!) 142/100, pulse 91, temperature 98.6 F (37 C), temperature source Temporal, resp. rate 16, height 5' (1.524 m), weight 149 lb (67.6 kg), SpO2 97 %.  General: Alert, interactive, in  no acute distress. HEENT: PERRLA, TMs pearly gray, turbinates non-edematous without discharge, post-pharynx non erythematous. Neck: Supple without lymphadenopathy. Lungs: Clear to auscultation without wheezing, rhonchi or rales. {no increased work of breathing. CV: Normal S1, S2 without murmurs. Abdomen: Nondistended, nontender. Skin: 3 faintly hyperpigmented macules about dime size on chest. Extremities:  No clubbing, cyanosis or edema. Neuro:   Grossly intact.  Diagnositics/Labs: None today  Assessment and plan: Chronic urticaria with angioedema  -  at this time etiology of hives and swelling is unknown.  Hives can be caused by a variety of different triggers including illness/infection, foods, medications, stings, exercise, pressure, vibrations, extremes of temperature to name a few however majority of the time there is no identifiable trigger.  Your symptoms have been ongoing for >6 weeks making this chronic thus will obtain labwork to evaluate: CBC w diff, CMP, tryptase, hive panel, environmental panel, alpha-gal panel, inflammatory markers  - for high-dose antihistamines use the following regimen: Zyrtec 10mg  1 tablet twice a day, Pepcid 20mg  1 tablet twice a day, Singulair 10mg  1 tablet.    If you notice any change in your mood or behavior after starting Singulair then stop this medication and let know.   - we briefly discussed Xolair monthly injection as step-up therapy if needed.  Will discuss this further at next visit if needing to step-up.  - previously prescribed an Epipen.  Will provide with an emergency action plan so she knows what symptoms  Will call you in about a week with lab results.  Follow-up 2-3 months or sooner if needed  I appreciate the opportunity to take part in Ziyah's care. Please do not hesitate to contact me with questions.  Sincerely,   , MD Allergy/Immunology [facility]

## 2019-10-20 NOTE — Patient Instructions (Addendum)
Hives, chronic  -  at this time etiology of hives and swelling is unknown.  Hives can be caused by a variety of different triggers including illness/infection, foods, medications, stings, exercise, pressure, vibrations, extremes of temperature to name a few however majority of the time there is no identifiable trigger.  Your symptoms have been ongoing for >6 weeks making this chronic thus will obtain labwork to evaluate: CBC w diff, CMP, tryptase, hive panel, environmental panel, alpha-gal panel, inflammatory markers  - for high-dose antihistamines use the following regimen: Zyrtec 10mg  1 tablet twice a day, Pepcid 20mg  1 tablet twice a day, Singulair 10mg  1 tablet.    If you notice any change in your mood or behavior after starting Singulair then stop this medication and let us know.   - we briefly discussed Xolair monthly injection as step-up therapy if needed.  Will discuss this further at next visit if needing to step-up.  - previously prescribed an Epipen.  Will provide with an emergency action plan so she knows what symptoms  Will call you in about a week with lab results.  Follow-up 2-3 months or sooner if needed

## 2019-10-22 ENCOUNTER — Encounter: Payer: Self-pay | Admitting: Adult Health

## 2019-10-23 ENCOUNTER — Encounter (INDEPENDENT_AMBULATORY_CARE_PROVIDER_SITE_OTHER): Payer: Self-pay

## 2019-10-26 ENCOUNTER — Encounter: Payer: Self-pay | Admitting: Internal Medicine

## 2019-10-26 ENCOUNTER — Other Ambulatory Visit: Payer: Self-pay

## 2019-10-26 ENCOUNTER — Ambulatory Visit (INDEPENDENT_AMBULATORY_CARE_PROVIDER_SITE_OTHER): Payer: Managed Care, Other (non HMO) | Admitting: Internal Medicine

## 2019-10-26 DIAGNOSIS — L500 Allergic urticaria: Secondary | ICD-10-CM

## 2019-10-26 DIAGNOSIS — I10 Essential (primary) hypertension: Secondary | ICD-10-CM | POA: Insufficient documentation

## 2019-10-26 DIAGNOSIS — K219 Gastro-esophageal reflux disease without esophagitis: Secondary | ICD-10-CM

## 2019-10-26 DIAGNOSIS — F419 Anxiety disorder, unspecified: Secondary | ICD-10-CM

## 2019-10-26 DIAGNOSIS — G43019 Migraine without aura, intractable, without status migrainosus: Secondary | ICD-10-CM

## 2019-10-26 MED ORDER — HYDROCHLOROTHIAZIDE 25 MG PO TABS: 25.0000 mg | ORAL_TABLET | Freq: Every day | ORAL | 1 refills | Status: DC

## 2019-10-26 NOTE — Patient Instructions (Addendum)
BP should be less than 130/80   All other Health Maintenance issues reviewed.   All recommended immunizations and age-appropriate screenings are up-to-date or discussed.  No immunization administered today.   Medications reviewed and updated.  Changes include :   Start hydrochlorothiazide 25 mg daily  Your prescription(s) have been submitted to your pharmacy. Please take as directed and contact our office if you believe you are having problem(s) with the medication(s).   Please followup in 6 months    Health Maintenance, Female Adopting a healthy lifestyle and getting preventive care are important in promoting health and wellness. Ask your health care provider about:  The right schedule for you to have regular tests and exams.  Things you can do on your own to prevent diseases and keep yourself healthy. What should I know about diet, weight, and exercise? Eat a healthy diet   Eat a diet that includes plenty of vegetables, fruits, low-fat dairy products, and lean protein.  Do not eat a lot of foods that are high in solid fats, added sugars, or sodium. Maintain a healthy weight Body mass index (BMI) is used to identify weight problems. It estimates body fat based on height and weight. Your health care provider can help determine your BMI and help you achieve or maintain a healthy weight. Get regular exercise Get regular exercise. This is one of the most important things you can do for your health. Most adults should:  Exercise for at least 150 minutes each week. The exercise should increase your heart rate and make you sweat (moderate-intensity exercise).  Do strengthening exercises at least twice a week. This is in addition to the moderate-intensity exercise.  Spend less time sitting. Even light physical activity can be beneficial. Watch cholesterol and blood lipids Have your blood tested for lipids and cholesterol at 24 years of age, then have this test every 5 years. Have  your cholesterol levels checked more often if:  Your lipid or cholesterol levels are high.  You are older than 24 years of age.  You are at high risk for heart disease. What should I know about cancer screening? Depending on your health history and family history, you may need to have cancer screening at various ages. This may include screening for:  Breast cancer.  Cervical cancer.  Colorectal cancer.  Skin cancer.  Lung cancer. What should I know about heart disease, diabetes, and high blood pressure? Blood pressure and heart disease  High blood pressure causes heart disease and increases the risk of stroke. This is more likely to develop in people who have high blood pressure readings, are of African descent, or are overweight.  Have your blood pressure checked: ? Every 3-5 years if you are 24-49 years of age. ? Every year if you are 69 years old or older. Diabetes Have regular diabetes screenings. This checks your fasting blood sugar level. Have the screening done:  Once every three years after age 67 if you are at a normal weight and have a low risk for diabetes.  More often and at a younger age if you are overweight or have a high risk for diabetes. What should I know about preventing infection? Hepatitis B If you have a higher risk for hepatitis B, you should be screened for this virus. Talk with your health care provider to find out if you are at risk for hepatitis B infection. Hepatitis C Testing is recommended for:  Everyone born from [birth year] through 1966.  Anyone with known  risk factors for hepatitis C. Sexually transmitted infections (STIs)  Get screened for STIs, including gonorrhea and chlamydia, if: ? You are sexually active and are younger than 24 years of age. ? You are older than 24 years of age and your health care provider tells you that you are at risk for this type of infection. ? Your sexual activity has changed since you were last screened, and you  are at increased risk for chlamydia or gonorrhea. Ask your health care provider if you are at risk.  Ask your health care provider about whether you are at high risk for HIV. Your health care provider may recommend a prescription medicine to help prevent HIV infection. If you choose to take medicine to prevent HIV, you should first get tested for HIV. You should then be tested every 3 months for as long as you are taking the medicine. Pregnancy  If you are about to stop having your period (premenopausal) and you may become pregnant, seek counseling before you get pregnant.  Take 400 to 800 micrograms (mcg) of folic acid every day if you become pregnant.  Ask for birth control (contraception) if you want to prevent pregnancy. Osteoporosis and menopause Osteoporosis is a disease in which the bones lose minerals and strength with aging. This can result in bone fractures. If you are 71 years old or older, or if you are at risk for osteoporosis and fractures, ask your health care provider if you should:  Be screened for bone loss.  Take a calcium or vitamin D supplement to lower your risk of fractures.  Be given hormone replacement therapy (HRT) to treat symptoms of menopause. Follow these instructions at home: Lifestyle  Do not use any products that contain nicotine or tobacco, such as cigarettes, e-cigarettes, and chewing tobacco. If you need help quitting, ask your health care provider.  Do not use street drugs.  Do not share needles.  Ask your health care provider for help if you need support or information about quitting drugs. Alcohol use  Do not drink alcohol if: ? Your health care provider tells you not to drink. ? You are pregnant, may be pregnant, or are planning to become pregnant.  If you drink alcohol: ? Limit how much you use to 0-1 drink a day. ? Limit intake if you are breastfeeding.  Be aware of how much alcohol is in your drink. In the [place] one drink equals one 12  oz bottle of beer (355 mL), one 5 oz glass of wine (148 mL), or one 1 oz glass of hard liquor (44 mL). General instructions  Schedule regular health, dental, and eye exams.  Stay current with your vaccines.  Tell your health care provider if: ? You often feel depressed. ? You have ever been abused or do not feel safe at home. Summary  Adopting a healthy lifestyle and getting preventive care are important in promoting health and wellness.  Follow your health care provider's instructions about healthy diet, exercising, and getting tested or screened for diseases.  Follow your health care provider's instructions on monitoring your cholesterol and blood pressure. This information is not intended to replace advice given to you by your health care provider. Make sure you discuss any questions you have with your health care provider. Document Released: 06/24/2011 Document Revised: 12/02/2018 Document Reviewed: 12/02/2018 Elsevier Patient Education  2020 [employer].

## 2019-10-26 NOTE — Assessment & Plan Note (Signed)
Occurs randomly Just recently saw an allergist - awaiting results ? meat

## 2019-10-26 NOTE — Assessment & Plan Note (Signed)
Strong family history of hypertension-mom, sister in her 57s Blood pressure elevated since June consistently Start hydrochlorothiazide 25 mg daily Advised low-sodium diet, regular exercise and weight loss She will have blood pressure checked at work or with family BMP in normal 1-normal 2 weeks Follow-up in 6 months

## 2019-10-26 NOTE — Progress Notes (Signed)
Subjective:    Patient ID: Dana Hood, female    DOB: July 26, 1996, 23 y.o.   MRN: [id number]  HPI She is here for a physical exam.   Her BP has been elevated for a little while.  It has been up since June.  She does have some dull headaches and is unsure if it is related to her blood pressure something else.  She does smoke, but has decreased significantly how much she smokes.  She is not always compliant with a low-sodium diet.  She is currently not exercising.  She has gained weight and would like to lose weight.  Weight - she is gaining weight and not doing anything different.  She does not exercise regularly, but is active.  She could eat better.  Medications and allergies reviewed with patient and updated if appropriate.  Patient Active Problem List   Diagnosis Date Noted  . Anxiety 10/26/2019  . GERD (gastroesophageal reflux disease) 10/26/2019  . Allergic urticaria history of and currently resolved  09/21/2019  . Migraines 01/30/2017    Current Outpatient Medications on File Prior to Visit  Medication Sig Dispense Refill  . cetirizine (ZYRTEC) 10 MG tablet Take 1 tablet (10 mg total) by mouth daily. 90 tablet 1  . EPINEPHrine (ADRENACLICK) 0.3 EQ/6.8 mL IJ SOAJ injection Inject 0.3 mLs (0.3 mg total) into the muscle as needed for anaphylaxis. 1 each 1  . etonogestrel-ethinyl estradiol (ELURYNG) 0.12-0.015 MG/24HR vaginal ring Insert vaginally and leave in place for 3 consecutive weeks, then remove for 1 week. 1 each 12  . famotidine (PEPCID) 20 MG tablet Take 20 mg by mouth 2 (two) times daily.    . sertraline (ZOLOFT) 100 MG tablet Take 1 tablet (100 mg total) by mouth daily. 30 tablet 9   No current facility-administered medications on file prior to visit.     Past Medical History:  Diagnosis Date  . Anxiety     Past Surgical History:  Procedure Laterality Date  . NO PAST SURGERIES      Social History   Socioeconomic History  . Marital status:  Married    Spouse name: Not on file  . Number of children: Not on file  . Years of education: Not on file  . Highest education level: Not on file  Occupational History  . Not on file  Social Needs  . Financial resource strain: Not on file  . Food insecurity    Worry: Not on file    Inability: Not on file  . Transportation needs    Medical: Not on file    Non-medical: Not on file  Tobacco Use  . Smoking status: Current Every Day Smoker    Packs/day: 0.25    Years: 5.00    Pack years: 1.25    Types: Cigarettes  . Smokeless tobacco: Never Used  Substance and Sexual Activity  . Alcohol use: Yes    Comment: social  . Drug use: Never  . Sexual activity: Yes    Partners: Male    Birth control/protection: Inserts    Comment: Nuvaring  Lifestyle  . Physical activity    Days per week: Not on file    Minutes per session: Not on file  . Stress: Not on file  Relationships  . Social Herbalist on phone: Not on file    Gets together: Not on file    Attends religious service: Not on file    Active member of club or  organization: Not on file    Attends meetings of clubs or organizations: Not on file    Relationship status: Not on file  Other Topics Concern  . Not on file  Social History Narrative  . Not on file    Family History  Problem Relation Age of Onset  . Hyperlipidemia Mother   . Hypertension Mother   . Migraines Mother   . Hypertension Sister   . Hyperlipidemia Sister   . Heart failure Maternal Grandfather   . Rheum arthritis Maternal Uncle   . Breast cancer Neg Hx   . Ovarian cancer Neg Hx   . Colon cancer Neg Hx     Review of Systems  Constitutional: Negative for chills and fever.  Eyes: Negative for visual disturbance.  Respiratory: Positive for cough (residual from URI). Negative for shortness of breath and wheezing.   Cardiovascular: Positive for palpitations. Negative for chest pain and leg swelling.  Gastrointestinal: Negative for abdominal  pain, blood in stool, constipation, diarrhea and nausea.       GERD daily - pepcid  Genitourinary: Negative for dysuria and hematuria.  Musculoskeletal: Negative for arthralgias and back pain.  Skin: Negative for color change and rash.  Neurological: Positive for headaches. Negative for dizziness and light-headedness.  Psychiatric/Behavioral: Negative for dysphoric mood. The patient is nervous/anxious.        Objective:   Vitals:   10/26/19 1545  BP: (!) 158/94  Pulse: (!) 116  Temp: 98.4 F (36.9 C)  SpO2: 98%   Filed Weights   10/26/19 1545  Weight: 147 lb (66.7 kg)   Body mass index is 28.71 kg/m.  BP Readings from Last 3 Encounters:  10/26/19 (!) 158/94  10/20/19 (!) 142/100  09/21/19 140/80    Wt Readings from Last 3 Encounters:  10/26/19 147 lb (66.7 kg)  10/20/19 149 lb (67.6 kg)  06/14/19 140 lb 14.4 oz (63.9 kg)     Physical Exam Constitutional: She appears well-developed and well-nourished. No distress.  HENT:  Head: Normocephalic and atraumatic.  Right Ear: External ear normal. Normal ear canal and TM Left Ear: External ear normal.  Normal ear canal and TM Mouth/Throat: Oropharynx is clear and moist.  Eyes: Conjunctivae and EOM are normal.  Neck: Neck supple. No tracheal deviation present. No thyromegaly present.  No carotid bruit  Cardiovascular: Normal rate, regular rhythm and normal heart sounds.   No murmur heard.  No edema. Pulmonary/Chest: Effort normal and breath sounds normal. No respiratory distress. She has no wheezes. She has no rales.  Breast: deferred   Abdominal: Soft. She exhibits no distension. There is no tenderness.  Lymphadenopathy: She has no cervical adenopathy.  Skin: Skin is warm and dry. She is not diaphoretic.  Psychiatric: She has a normal mood and affect. Her behavior is normal.        Assessment & Plan:   Physical exam: Screening blood work    reviewed-no additional blood work needed for screening purposes  Immunizations  Up to date, deferred flu Gyn  Up to date  Exercise  Active, no regular exercise Weight  Wants to lose weight Substance abuse  Smokes on weekends - encouraged smoking cessation  See Problem List for Assessment and Plan of chronic medical problems.  Follow-up in 6 months

## 2019-10-26 NOTE — Assessment & Plan Note (Signed)
Well-controlled on current dose of sertraline Prescribed by gynecologist

## 2019-10-26 NOTE — Assessment & Plan Note (Signed)
Taking Pepcid twice daily for GERD and allergic urticaria

## 2019-10-26 NOTE — Assessment & Plan Note (Signed)
Last migraine was months ago

## 2019-10-30 LAB — IGE+ALLERGENS ZONE 2(30)
Alternaria Alternata IgE: <0.1 kU/L
Amer Sycamore IgE Qn: <0.1 kU/L
Aspergillus Fumigatus IgE: <0.1 kU/L
Bahia Grass IgE: 5.76 kU/L — AB
Bermuda Grass IgE: 1.83 kU/L — AB
Cat Dander IgE: 0.34 kU/L — AB
Cedar, Mountain IgE: 0.11 kU/L — AB
Cladosporium Herbarum IgE: <0.1 kU/L
Cockroach, American IgE: <0.1 kU/L
Common Silver Birch IgE: <0.1 kU/L
D Farinae IgE: <0.1 kU/L
D Pteronyssinus IgE: <0.1 kU/L
Dog Dander IgE: 0.66 kU/L — AB
Elm, American IgE: <0.1 kU/L
Hickory, White IgE: <0.1 kU/L
IgE (Immunoglobulin E), Serum: 302 IU/mL (ref 6–495)
Johnson Grass IgE: 4.82 kU/L — AB
Maple/Box Elder IgE: <0.1 kU/L
Mucor Racemosus IgE: <0.1 kU/L
Mugwort IgE Qn: <0.1 kU/L
Nettle IgE: <0.1 kU/L
Oak, White IgE: <0.1 kU/L
Penicillium Chrysogen IgE: <0.1 kU/L
Pigweed, Rough IgE: <0.1 kU/L
Plantain, English IgE: 0.11 kU/L — AB
Ragweed, Short IgE: <0.1 kU/L
Sheep Sorrel IgE Qn: <0.1 kU/L
Stemphylium Herbarum IgE: <0.1 kU/L
Sweet gum IgE RAST Ql: <0.1 kU/L
Timothy Grass IgE: 13.8 kU/L — AB
White Mulberry IgE: <0.1 kU/L

## 2019-10-30 LAB — CBC WITH DIFFERENTIAL/PLATELET
Basophils Absolute: 0.1 10*3/uL (ref 0.0–0.2)
Basos: 2 %
EOS (ABSOLUTE): 0.2 10*3/uL (ref 0.0–0.4)
Eos: 4 %
Hematocrit: 39.8 % (ref 34.0–46.6)
Hemoglobin: 13.2 g/dL (ref 11.1–15.9)
Immature Grans (Abs): 0 10*3/uL (ref 0.0–0.1)
Immature Granulocytes: 0 %
Lymphocytes Absolute: 1.3 10*3/uL (ref 0.7–3.1)
Lymphs: 30 %
MCH: 29.8 pg (ref 26.6–33.0)
MCHC: 33.2 g/dL (ref 31.5–35.7)
MCV: 90 fL (ref 79–97)
Monocytes Absolute: 0.5 10*3/uL (ref 0.1–0.9)
Monocytes: 11 %
Neutrophils Absolute: 2.3 10*3/uL (ref 1.4–7.0)
Neutrophils: 53 %
Platelets: 311 10*3/uL (ref 150–450)
RBC: 4.43 x10E6/uL (ref 3.77–5.28)
RDW: 11.9 % (ref 11.7–15.4)
WBC: 4.3 10*3/uL (ref 3.4–10.8)

## 2019-10-30 LAB — ALPHA-GAL PANEL
Alpha Gal IgE*: 55.6 kU/L — ABNORMAL HIGH (ref <0.10)
Beef (Bos spp) IgE: 12.7 kU/L — ABNORMAL HIGH (ref <0.35)
Class Interpretation: 2
Class Interpretation: 3
Class Interpretation: 3
Lamb/Mutton (Ovis spp) IgE: 1.66 kU/L — ABNORMAL HIGH (ref <0.35)
Pork (Sus spp) IgE: 6.22 kU/L — ABNORMAL HIGH (ref <0.35)

## 2019-10-30 LAB — ANA W/REFLEX: Anti Nuclear Antibody (ANA): NEGATIVE

## 2019-10-30 LAB — THYROID ANTIBODIES
Thyroglobulin Antibody: <1 IU/mL (ref 0.0–0.9)
Thyroperoxidase Ab SerPl-aCnc: <9 IU/mL (ref 0–34)

## 2019-10-30 LAB — COMPREHENSIVE METABOLIC PANEL
ALT: 13 IU/L (ref 0–32)
AST: 17 IU/L (ref 0–40)
Albumin/Globulin Ratio: 1.4 (ref 1.2–2.2)
Albumin: 4.5 g/dL (ref 3.9–5.0)
Alkaline Phosphatase: 92 IU/L (ref 39–117)
BUN/Creatinine Ratio: 10 (ref 9–23)
BUN: 8 mg/dL (ref 6–20)
Bilirubin Total: <0.2 mg/dL (ref 0.0–1.2)
CO2: 23 mmol/L (ref 20–29)
Calcium: 10.1 mg/dL (ref 8.7–10.2)
Chloride: 98 mmol/L (ref 96–106)
Creatinine, Ser: 0.8 mg/dL (ref 0.57–1.00)
GFR calc Af Amer: 119 mL/min/{1.73_m2} (ref >59)
GFR calc non Af Amer: 104 mL/min/{1.73_m2} (ref >59)
Globulin, Total: 3.2 g/dL (ref 1.5–4.5)
Glucose: 89 mg/dL (ref 65–99)
Potassium: 4.5 mmol/L (ref 3.5–5.2)
Sodium: 138 mmol/L (ref 134–144)
Total Protein: 7.7 g/dL (ref 6.0–8.5)

## 2019-10-30 LAB — CHRONIC URTICARIA: cu index: 49.7 — ABNORMAL HIGH (ref <10)

## 2019-10-30 LAB — SEDIMENTATION RATE: Sed Rate: 20 mm/hr (ref 0–32)

## 2019-10-30 LAB — TRYPTASE: Tryptase: 3.4 ug/L (ref 2.2–13.2)

## 2019-11-22 ENCOUNTER — Telehealth: Payer: Self-pay | Admitting: *Deleted

## 2019-11-22 MED ORDER — LABETALOL HCL 100 MG PO TABS: 100.0000 mg | ORAL_TABLET | Freq: Two times a day (BID) | ORAL | 5 refills | Status: DC

## 2019-11-22 NOTE — Telephone Encounter (Signed)
Think we need to add a second medication.  If she thinks there is a chance of pregnancy we would consider labetalol, which is taken twice a day.  If she does not think she will get pregnant we can do something once a day, but we do not want to take a chance with any medications that could cause birth defects.

## 2019-11-22 NOTE — Telephone Encounter (Signed)
Patient is requesting changes in blood pressure medications. Her most recent readings were: 148/98 142/88 122/92  Please advise. Thanks!

## 2019-11-22 NOTE — Telephone Encounter (Signed)
Pt informed of below. She is agreeable to try labetalol. Please send to [facility] dr. Lorina Rabon.

## 2019-12-10 ENCOUNTER — Encounter: Payer: Self-pay | Admitting: Emergency Medicine

## 2019-12-10 ENCOUNTER — Emergency Department
Admission: EM | Admit: 2019-12-10 | Discharge: 2019-12-10 | Disposition: A | Discharge status: Home / Self Care | Payer: Managed Care, Other (non HMO) | Attending: Emergency Medicine | Admitting: Emergency Medicine

## 2019-12-10 ENCOUNTER — Other Ambulatory Visit: Payer: Self-pay

## 2019-12-10 DIAGNOSIS — I10 Essential (primary) hypertension: Secondary | ICD-10-CM | POA: Diagnosis not present

## 2019-12-10 DIAGNOSIS — F17210 Nicotine dependence, cigarettes, uncomplicated: Secondary | ICD-10-CM | POA: Diagnosis not present

## 2019-12-10 DIAGNOSIS — Z79899 Other long term (current) drug therapy: Secondary | ICD-10-CM | POA: Diagnosis not present

## 2019-12-10 DIAGNOSIS — I471 Supraventricular tachycardia: Secondary | ICD-10-CM | POA: Insufficient documentation

## 2019-12-10 DIAGNOSIS — R000 Tachycardia, unspecified: Secondary | ICD-10-CM | POA: Diagnosis present

## 2019-12-10 HISTORY — DX: Essential (primary) hypertension: I10

## 2019-12-10 LAB — URINALYSIS, COMPLETE (UACMP) WITH MICROSCOPIC
Bilirubin Urine: NEGATIVE
Glucose, UA: NEGATIVE mg/dL
Hgb urine dipstick: NEGATIVE
Ketones, ur: NEGATIVE mg/dL
Leukocytes,Ua: NEGATIVE
Nitrite: NEGATIVE
Protein, ur: NEGATIVE mg/dL
Specific Gravity, Urine: 1.017 (ref 1.005–1.030)
pH: 7 (ref 5.0–8.0)

## 2019-12-10 LAB — POCT PREGNANCY, URINE: Preg Test, Ur: NEGATIVE

## 2019-12-10 LAB — COMPREHENSIVE METABOLIC PANEL
ALT: 23 U/L (ref 0–44)
AST: 30 U/L (ref 15–41)
Albumin: 4.9 g/dL (ref 3.5–5.0)
Alkaline Phosphatase: 92 U/L (ref 38–126)
Anion gap: 12 (ref 5–15)
BUN: 12 mg/dL (ref 6–20)
CO2: 25 mmol/L (ref 22–32)
Calcium: 9.7 mg/dL (ref 8.9–10.3)
Chloride: 101 mmol/L (ref 98–111)
Creatinine, Ser: 0.85 mg/dL (ref 0.44–1.00)
GFR calc Af Amer: >60 mL/min (ref >60)
GFR calc non Af Amer: >60 mL/min (ref >60)
Glucose, Bld: 100 mg/dL — ABNORMAL HIGH (ref 70–99)
Potassium: 3.3 mmol/L — ABNORMAL LOW (ref 3.5–5.1)
Sodium: 138 mmol/L (ref 135–145)
Total Bilirubin: 0.6 mg/dL (ref 0.3–1.2)
Total Protein: 9 g/dL — ABNORMAL HIGH (ref 6.5–8.1)

## 2019-12-10 LAB — CBC
HCT: 46 % (ref 36.0–46.0)
Hemoglobin: 14.7 g/dL (ref 12.0–15.0)
MCH: 29.4 pg (ref 26.0–34.0)
MCHC: 32 g/dL (ref 30.0–36.0)
MCV: 92 fL (ref 80.0–100.0)
Platelets: 331 10*3/uL (ref 150–400)
RBC: 5 MIL/uL (ref 3.87–5.11)
RDW: 12.9 % (ref 11.5–15.5)
WBC: 6.2 10*3/uL (ref 4.0–10.5)
nRBC: 0 % (ref 0.0–0.2)

## 2019-12-10 MED ORDER — ADENOSINE 6 MG/2ML IV SOLN: 6.0000 mg | Freq: Once | INTRAVENOUS | Status: AC

## 2019-12-10 MED ORDER — ADENOSINE 6 MG/2ML IV SOLN
INTRAVENOUS | Status: AC
  Administered 2019-12-10: 6 mg via INTRAVENOUS
  Filled 2019-12-10: qty 2

## 2019-12-10 MED ORDER — SODIUM CHLORIDE 0.9 % IV BOLUS
1000.0000 mL | Freq: Once | INTRAVENOUS | Status: AC
  Administered 2019-12-10: 1000 mL via INTRAVENOUS

## 2019-12-10 MED ORDER — ADENOSINE 12 MG/4ML IV SOLN
INTRAVENOUS | Status: AC
  Filled 2019-12-10: qty 4

## 2019-12-10 NOTE — ED Notes (Signed)
Pt states she feels better now. Pt HR currently in the 90s.

## 2019-12-10 NOTE — ED Notes (Signed)
Pt presents to the ED from home. Pt currently in SVT. HR in the 190s. Dr. Kerman Passey at bedside. Vagal maneuver unsuccessful. See further orders.

## 2019-12-10 NOTE — ED Provider Notes (Signed)
[facility] Emergency Department Provider Note  Time seen: 1:52 PM  I have reviewed the triage vital signs and the nursing notes.   HISTORY  Chief Complaint Tachycardia   HPI Dana Hood is a 23 y.o. female with a past medical history of anxiety, hypertension, presents to the emergency department for rapid heart rate.  According to the patient approximately 45 minutes prior to arrival while driving around she began feeling her heart racing.  No history of a similar event.  States mild shortness of breath with the heart racing but denies any chest pain.  Denies any recent illnesses cough fever.  Overall patient appears well.  No distress at this time.  Past Medical History:  Diagnosis Date  . Anxiety   . Hypertension     Patient Active Problem List   Diagnosis Date Noted  . Anxiety 10/26/2019  . GERD (gastroesophageal reflux disease) 10/26/2019  . Hypertension 10/26/2019  . Allergic urticaria history of and currently resolved  09/21/2019  . Migraines 01/30/2017    Past Surgical History:  Procedure Laterality Date  . NO PAST SURGERIES      Prior to Admission medications   Medication Sig Start Date End Date Taking? Authorizing Provider  cetirizine (ZYRTEC) 10 MG tablet Take 1 tablet (10 mg total) by mouth daily. 07/06/19   Flinchum, Kelby Aline, FNP  EPINEPHrine (ADRENACLICK) 0.3 RC/7.8 mL IJ SOAJ injection Inject 0.3 mLs (0.3 mg total) into the muscle as needed for anaphylaxis. 09/21/19   Flinchum, Kelby Aline, FNP  etonogestrel-ethinyl estradiol Vevelyn Royals) 0.12-0.015 MG/24HR vaginal ring Insert vaginally and leave in place for 3 consecutive weeks, then remove for 1 week. 06/14/19   Lawhorn, Lara Mulch, CNM  famotidine (PEPCID) 20 MG tablet Take 20 mg by mouth 2 (two) times daily.    [provider]  hydrochlorothiazide (HYDRODIURIL) 25 MG tablet Take 1 tablet (25 mg total) by mouth daily. 10/26/19   Binnie Rail, MD  labetalol  (NORMODYNE) 100 MG tablet Take 1 tablet (100 mg total) by mouth 2 (two) times daily. 11/22/19   Binnie Rail, MD  sertraline (ZOLOFT) 100 MG tablet Take 1 tablet (100 mg total) by mouth daily. 07/13/19   Diona Fanti, CNM    Allergies  Allergen Reactions  . Alpha-Gal     No red meat    Family History  Problem Relation Age of Onset  . Hyperlipidemia Mother   . Hypertension Mother   . Migraines Mother   . Hypertension Sister   . Hyperlipidemia Sister   . Heart failure Maternal Grandfather   . Rheum arthritis Maternal Uncle   . Breast cancer Neg Hx   . Ovarian cancer Neg Hx   . Colon cancer Neg Hx     Social History Social History   Tobacco Use  . Smoking status: Current Every Day Smoker    Packs/day: 0.25    Years: 5.00    Pack years: 1.25    Types: Cigarettes  . Smokeless tobacco: Never Used  Substance Use Topics  . Alcohol use: Yes    Comment: social  . Drug use: Never    Review of Systems Constitutional: Negative for fever. Cardiovascular: Negative for chest pain.  Positive for palpitations/racing heart rate Respiratory: Mild shortness of breath with the rapid heart rate.  Negative for cough. Gastrointestinal: Negative for abdominal pain, vomiting and diarrhea. Genitourinary: Negative for urinary compaints Musculoskeletal: Negative for musculoskeletal complaints Neurological: Negative for headache All other ROS negative  ____________________________________________  PHYSICAL EXAM:  VITAL SIGNS: ED Triage Vitals  Enc Vitals Group     BP 12/10/19 1314 (!) 137/96     Pulse Rate 12/10/19 1306 (!) 169     Resp 12/10/19 1306 20     Temp 12/10/19 1321 98.2 F (36.8 C)     Temp Source 12/10/19 1321 Oral     SpO2 12/10/19 1306 100 %     Weight 12/10/19 1303 140 lb (63.5 kg)     Height 12/10/19 1303 5' (1.524 m)     Head Circumference --      Peak Flow --      Pain Score 12/10/19 1303 0     Pain Loc --      Pain Edu? --      Excl. in GC?  --    Constitutional: Alert and oriented. Well appearing and in no distress. Eyes: Normal exam ENT      Head: Normocephalic and atraumatic.      Mouth/Throat: Mucous membranes are moist. Cardiovascular: Regular rhythm rate around 150.  No obvious murmur. Respiratory: Normal respiratory effort without tachypnea nor retractions. Breath sounds are clear Gastrointestinal: Soft and nontender. No distention. Musculoskeletal: Nontender with normal range of motion in all extremities.  Neurologic:  Normal speech and language. No gross focal neurologic deficits  Skin:  Skin is warm, dry and intact.  Psychiatric: Mood and affect are normal.   ____________________________________________    EKG  EKG viewed and interpreted by myself shows supraventricular tachycardia 163 bpm with a narrow QRS, normal axis, normal intervals nonspecific ST changes  ____________________________________________   INITIAL IMPRESSION / ASSESSMENT AND PLAN / ED COURSE  Pertinent labs & imaging results that were available during my care of the patient were reviewed by me and considered in my medical decision making (see chart for details).   Patient presents to the emergency department with rapid heart rate found to be in SVT around 160 upon arrival.  Attempted vagal maneuvers without success.  Patient given 6 mg of adenosine and converted back to a sinus rhythm.  We will check basic labs, IV hydrate and continue to watch on telemetry.  Patient is feeling much better.  Patient's labs have resulted reassuring.  Urinalysis negative.  Point-of-care pregnancy negative.  We will discharge home with PCP follow-up.  Dana Hood was evaluated in Emergency Department on 12/10/2019 for the symptoms described in the history of present illness. She was evaluated in the context of the global COVID-19 pandemic, which necessitated consideration that the patient might be at risk for infection with the SARS-CoV-2 virus that  causes COVID-19. Institutional protocols and algorithms that pertain to the evaluation of patients at risk for COVID-19 are in a state of rapid change based on information released by regulatory bodies including the CDC and federal and state organizations. These policies and algorithms were followed during the patient's care in the ED.  ____________________________________________   FINAL CLINICAL IMPRESSION(S) / ED DIAGNOSES  Supraventricular tachycardia   Minna Antis, MD 12/10/19 1517

## 2019-12-10 NOTE — ED Triage Notes (Signed)
Pt was out with husband and started to feel like heart racing and had a funny feeling in heart.  Has had some SHOB.  No pain at this time.

## 2019-12-24 NOTE — L&D Delivery Note (Signed)
       Delivery Note   Dana Hood is a 24 y.o. G1P1001 at [redacted]w[redacted]d Estimated Date of Delivery: 12/29/20  PRE-OPERATIVE DIAGNOSIS:  1) [redacted]w[redacted]d pregnancy.  Chronic hypertension  POST-OPERATIVE DIAGNOSIS:  1) [redacted]w[redacted]d pregnancy s/p Vaginal, Spontaneous    Delivery Type: Vaginal, Spontaneous    Delivery Anesthesia: Epidural   Labor Complications:  none    ESTIMATED BLOOD LOSS: 380 ml    FINDINGS:   1) female infant, Apgar scores of 9   at 1 minute and 9   at 5 minutes and a birthweight pending , infant remains skin to skin.   2) Nuchal cord: no  SPECIMENS:   PLACENTA:   Appearance: Intact , 3 vessel cord, cord blood sample collected   Removal: Spontaneous      Disposition:  per protocol   DISPOSITION:  Infant to left in stable condition in the delivery room, with L&D personnel and mother,  NARRATIVE SUMMARY: Labor course:  Dana Hood is a G1P1001 at [redacted]w[redacted]d who presented for induction of labor.  She progressed well in labor with, cytotec, cooks catheter, and  pitocin.  She received the appropriate epidural anesthesia and proceeded to complete dilation. She evidenced good maternal expulsive effort during the second stage. She went on to deliver a viable female infant "[employer]". The placenta delivered without problems and was noted to be complete. A perineal and vaginal examination was performed.  Laceration: Right periurethral repaired with 4-0 vicryl. Left periurethral abrasions that were hemostatinc not in need of repair.  The patient tolerated this well.  [name], CNM  12/08/2020 3:35 PM

## 2020-01-23 ENCOUNTER — Other Ambulatory Visit: Payer: Self-pay | Admitting: Adult Health

## 2020-01-28 ENCOUNTER — Ambulatory Visit: Payer: Managed Care, Other (non HMO) | Admitting: Allergy

## 2020-03-06 ENCOUNTER — Encounter: Payer: Self-pay | Admitting: Emergency Medicine

## 2020-03-06 ENCOUNTER — Other Ambulatory Visit: Payer: Self-pay

## 2020-03-06 ENCOUNTER — Ambulatory Visit
Admission: EM | Admit: 2020-03-06 | Discharge: 2020-03-06 | Disposition: A | Discharge status: Home / Self Care | Payer: Managed Care, Other (non HMO)

## 2020-03-06 DIAGNOSIS — W57XXXA Bitten or stung by nonvenomous insect and other nonvenomous arthropods, initial encounter: Secondary | ICD-10-CM | POA: Diagnosis not present

## 2020-03-06 DIAGNOSIS — R21 Rash and other nonspecific skin eruption: Secondary | ICD-10-CM

## 2020-03-06 HISTORY — DX: Allergy to other foods: Z91.018

## 2020-03-06 MED ORDER — DOXYCYCLINE HYCLATE 100 MG PO CAPS: 100.0000 mg | ORAL_CAPSULE | Freq: Two times a day (BID) | ORAL | 0 refills | Status: DC

## 2020-03-06 NOTE — Discharge Instructions (Signed)
Take the doxycycline twice a day for 10 days.    Return here or go to the emergency department if you have increased redness or develop red streaks, fever, or other concerning symptoms.

## 2020-03-06 NOTE — ED Provider Notes (Signed)
Roderic Palau    CSN: [id number] Arrival date & time: 03/06/20  0816      History   Chief Complaint Chief Complaint  Patient presents with   Blister   Rash    HPI Dana Hood is a 24 y.o. female.   Patient presents with a tick bite in between her left toes which occurred 2 days ago.  She removed the intact tick including the head.  She states she has a blister in that area and several smaller blisters surrounding with redness.  She states the redness has improved significantly from yesterday.  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  She denies fever, chills, other rash, numbness, weakness, or other symptoms.  No treatments attempted at home.    The history is provided by the patient.    Past Medical History:  Diagnosis Date   Allergy to alpha-gal    Anxiety    Hypertension     Patient Active Problem List   Diagnosis Date Noted   Anxiety 10/26/2019   GERD (gastroesophageal reflux disease) 10/26/2019   Hypertension 10/26/2019   Allergic urticaria history of and currently resolved  09/21/2019   Migraines 01/30/2017    Past Surgical History:  Procedure Laterality Date   NO PAST SURGERIES      OB History    Gravida  0   Para  0   Term  0   Preterm  0   AB  0   Living  0     SAB  0   TAB  0   Ectopic  0   Multiple  0   Live Births  0            Home Medications    Prior to Admission medications   Medication Sig Start Date End Date Taking? Authorizing Provider  EPINEPHrine (ADRENACLICK) 0.3 VZ/8.5 mL IJ SOAJ injection Inject 0.3 mLs (0.3 mg total) into the muscle as needed for anaphylaxis. 09/21/19  Yes Flinchum, Kelby Aline, FNP  famotidine (PEPCID) 20 MG tablet Take 20 mg by mouth 2 (two) times daily.   Yes [provider]  hydrochlorothiazide (HYDRODIURIL) 25 MG tablet Take 1 tablet (25 mg total) by mouth daily. 10/26/19  Yes Burns, Claudina Lick, MD  labetalol (NORMODYNE) 100 MG tablet  Take 1 tablet (100 mg total) by mouth 2 (two) times daily. 11/22/19  Yes Burns, Claudina Lick, MD  Prenatal Vit-Fe Fumarate-FA (PRENATAL VITAMINS PO) Take 1 tablet by mouth daily.   Yes [provider]  sertraline (ZOLOFT) 100 MG tablet Take 1 tablet (100 mg total) by mouth daily. 07/13/19  Yes Lawhorn, Lara Mulch, CNM  cetirizine (ZYRTEC) 10 MG tablet Take 1 tablet (10 mg total) by mouth daily. 07/06/19   Flinchum, Kelby Aline, FNP  doxycycline (VIBRAMYCIN) 100 MG capsule Take 1 capsule (100 mg total) by mouth 2 (two) times daily. 03/06/20   Sharion Balloon, NP  etonogestrel-ethinyl estradiol Vevelyn Royals) 0.12-0.015 MG/24HR vaginal ring Insert vaginally and leave in place for 3 consecutive weeks, then remove for 1 week. 06/14/19   Diona Fanti, CNM    Family History Family History  Problem Relation Age of Onset   Hyperlipidemia Mother    Hypertension Mother    Migraines Mother    Hypertension Sister    Hyperlipidemia Sister    Heart failure Maternal Grandfather    Rheum arthritis Maternal Uncle    Breast cancer Neg Hx    Ovarian cancer Neg Hx  Colon cancer Neg Hx     Social History Social History   Tobacco Use   Smoking status: Current Every Day Smoker    Packs/day: 0.25    Years: 5.00    Pack years: 1.25    Types: Cigarettes   Smokeless tobacco: Never Used   Tobacco comment: 3-4 cig/day  Substance Use Topics   Alcohol use: Yes    Comment: social   Drug use: Never     Allergies   Alpha-gal   Review of Systems Review of Systems  Constitutional: Negative for chills and fever.  HENT: Negative for ear pain and sore throat.   Eyes: Negative for pain and visual disturbance.  Respiratory: Negative for cough and shortness of breath.   Cardiovascular: Negative for chest pain and palpitations.  Gastrointestinal: Negative for abdominal pain and vomiting.  Genitourinary: Negative for dysuria and hematuria.  Musculoskeletal: Negative for  arthralgias and back pain.  Skin: Positive for rash.  Neurological: Negative for seizures, syncope, weakness and numbness.  All other systems reviewed and are negative.    Physical Exam Triage Vital Signs ED Triage Vitals  Enc Vitals Group     BP      Pulse      Resp      Temp      Temp src      SpO2      Weight      Height      Head Circumference      Peak Flow      Pain Score      Pain Loc      Pain Edu?      Excl. in GC?    No data found.  Updated Vital Signs BP 115/80 (BP Location: Left Arm)    Pulse 92    Temp 98.4 F (36.9 C) (Oral)    Resp 18    Ht 5\' 1"  (1.549 m)    Wt 150 lb (68 kg)    LMP 02/21/2020 (Exact Date)    SpO2 98%    BMI 28.34 kg/m   Visual Acuity Right Eye Distance:   Left Eye Distance:   Bilateral Distance:    Right Eye Near:   Left Eye Near:    Bilateral Near:     Physical Exam Vitals and nursing note reviewed.  Constitutional:      General: She is not in acute distress.    Appearance: She is well-developed. She is not ill-appearing.  HENT:     Head: Normocephalic and atraumatic.     Mouth/Throat:     Mouth: Mucous membranes are moist.     Pharynx: Oropharynx is clear.  Eyes:     Conjunctiva/sclera: Conjunctivae normal.  Cardiovascular:     Rate and Rhythm: Normal rate and regular rhythm.     Heart sounds: No murmur.  Pulmonary:     Effort: Pulmonary effort is normal. No respiratory distress.     Breath sounds: Normal breath sounds.  Abdominal:     General: Bowel sounds are normal.     Palpations: Abdomen is soft.     Tenderness: There is no abdominal tenderness. There is no guarding or rebound.  Musculoskeletal:        General: No swelling or deformity. Normal range of motion.     Cervical back: Neck supple.       Feet:  Skin:    General: Skin is warm and dry.     Capillary Refill: Capillary refill takes less than  2 seconds.     Comments: 37mm blister in between great toe and 2nd toe with localized erythema.  Four 1-2 mm  scabbed wounds on anterior foot just below 2nd toe; these appear to be healing areas that were previously scratched open; localized erythema.    Neurological:     General: No focal deficit present.     Mental Status: She is alert and oriented to person, place, and time.     Sensory: No sensory deficit.     Motor: No weakness.     Gait: Gait normal.  Psychiatric:        Mood and Affect: Mood normal.        Behavior: Behavior normal.      UC Treatments / Results  Labs (all labs ordered are listed, but only abnormal results are displayed) Labs Reviewed - No data to display  EKG   Radiology No results found.  Procedures Procedures (including critical care time)  Medications Ordered in UC Medications - No data to display  Initial Impression / Assessment and Plan / UC Course  I have reviewed the triage vital signs and the nursing notes.  Pertinent labs & imaging results that were available during my care of the patient were reviewed by me and considered in my medical decision making (see chart for details).    Tick bite.  Treating with doxycycline x 10 days.  Instructed patient to return here or go to the ED if she has increased redness or develops new symptoms such as red streaks, fever, or other concerns.  She agrees to plan of care.     Final Clinical Impressions(s) / UC Diagnoses   Final diagnoses:  Tick bite, initial encounter     Discharge Instructions     Take the doxycycline twice a day for 10 days.    Return here or go to the emergency department if you have increased redness or develop red streaks, fever, or other concerning symptoms.        ED Prescriptions    Medication Sig Dispense Auth. Provider   doxycycline (VIBRAMYCIN) 100 MG capsule Take 1 capsule (100 mg total) by mouth 2 (two) times daily. 20 capsule Mickie Bail, NP     PDMP not reviewed this encounter.   Mickie Bail, NP 03/06/20 [phone]

## 2020-03-06 NOTE — ED Triage Notes (Signed)
Patient also states that she has had a headache over the weekend.

## 2020-03-06 NOTE — ED Triage Notes (Signed)
Patient in today c/o a blister and rash on her left foot and between her great and 2nd toe x 2 days. Patient states she removed a tick from that area 3 days ago. Patient states she has Alpha Gal.

## 2020-04-11 ENCOUNTER — Encounter: Payer: Managed Care, Other (non HMO) | Admitting: Certified Nurse Midwife

## 2020-04-12 ENCOUNTER — Other Ambulatory Visit: Payer: Self-pay | Admitting: Certified Nurse Midwife

## 2020-04-13 ENCOUNTER — Encounter: Payer: Self-pay | Admitting: Certified Nurse Midwife

## 2020-04-13 ENCOUNTER — Other Ambulatory Visit: Payer: Self-pay

## 2020-04-13 ENCOUNTER — Ambulatory Visit (INDEPENDENT_AMBULATORY_CARE_PROVIDER_SITE_OTHER): Payer: Managed Care, Other (non HMO) | Admitting: Certified Nurse Midwife

## 2020-04-13 ENCOUNTER — Other Ambulatory Visit (HOSPITAL_COMMUNITY)
Admission: RE | Admit: 2020-04-13 | Discharge: 2020-04-13 | Disposition: A | Discharge status: Home / Self Care | Payer: Managed Care, Other (non HMO) | Source: Ambulatory Visit | Attending: Certified Nurse Midwife | Admitting: Certified Nurse Midwife

## 2020-04-13 VITALS — BP 130/86 | HR 78 | Ht 61.0 in | Wt 148.2 lb

## 2020-04-13 DIAGNOSIS — Z01419 Encounter for gynecological examination (general) (routine) without abnormal findings: Secondary | ICD-10-CM

## 2020-04-13 DIAGNOSIS — F419 Anxiety disorder, unspecified: Secondary | ICD-10-CM

## 2020-04-13 DIAGNOSIS — F32A Depression, unspecified: Secondary | ICD-10-CM

## 2020-04-13 DIAGNOSIS — Z124 Encounter for screening for malignant neoplasm of cervix: Secondary | ICD-10-CM | POA: Insufficient documentation

## 2020-04-13 DIAGNOSIS — F329 Major depressive disorder, single episode, unspecified: Secondary | ICD-10-CM | POA: Diagnosis not present

## 2020-04-13 NOTE — Progress Notes (Signed)
ANNUAL PREVENTATIVE CARE GYN  ENCOUNTER NOTE  Subjective:       Dana Hood is a 24 y.o. G0P0000 female here for a routine annual gynecologic exam.  Current complaints: 1. Needs Zoloft refill 2. Trying to conceive since November 2020  Denies difficulty breathing or respiratory distress, chest pain, abdominal pain, excessive vaginal bleeding, dysuria, and leg pain or swelling.    Gynecologic History  Patient's last menstrual period was 03/22/2020 (exact date). Period Cycle (Days): 28 Period Duration (Days): 4 Period Pattern: Regular Menstrual Flow: Moderate Menstrual Control: Tampon Menstrual Control Change Freq (Hours): 2-3 Dysmenorrhea: (!) Mild Dysmenorrhea Symptoms: Cramping  Contraception: none  Last Pap: 02/2017. Results were: normal  Obstetric History  OB History  Gravida Para Term Preterm AB Living  0 0 0 0 0 0  SAB TAB Ectopic Multiple Live Births  0 0 0 0 0    Past Medical History:  Diagnosis Date  . Allergy to alpha-gal   . Anxiety   . Atrial fibrillation with tachycardic ventricular rate (HCC)   . Hypertension     Past Surgical History:  Procedure Laterality Date  . NO PAST SURGERIES      Current Outpatient Medications on File Prior to Visit  Medication Sig Dispense Refill  . cetirizine (ZYRTEC) 10 MG tablet Take 1 tablet (10 mg total) by mouth daily. 90 tablet 1  . EPINEPHrine (ADRENACLICK) 0.3 mg/0.3 mL IJ SOAJ injection Inject 0.3 mLs (0.3 mg total) into the muscle as needed for anaphylaxis. 1 each 1  . famotidine (PEPCID) 20 MG tablet Take 20 mg by mouth 2 (two) times daily.    . hydrochlorothiazide (HYDRODIURIL) 25 MG tablet Take 1 tablet (25 mg total) by mouth daily. 90 tablet 1  . labetalol (NORMODYNE) 100 MG tablet Take 1 tablet (100 mg total) by mouth 2 (two) times daily. 60 tablet 5  . montelukast (SINGULAIR) 10 MG tablet Take 10 mg by mouth at bedtime.    . Prenatal Vit-Fe Fumarate-FA (PRENATAL VITAMINS PO) Take 1 tablet by mouth  daily.    . sertraline (ZOLOFT) 100 MG tablet Take 1 tablet (100 mg total) by mouth daily. 30 tablet 9  . doxycycline (VIBRAMYCIN) 100 MG capsule Take 1 capsule (100 mg total) by mouth 2 (two) times daily. (Patient not taking: Reported on 04/13/2020) 20 capsule 0  . etonogestrel-ethinyl estradiol (ELURYNG) 0.12-0.015 MG/24HR vaginal ring Insert vaginally and leave in place for 3 consecutive weeks, then remove for 1 week. (Patient not taking: Reported on 04/13/2020) 1 each 12   No current facility-administered medications on file prior to visit.    Allergies  Allergen Reactions  . Alpha-Gal     No red meat    Social History   Socioeconomic History  . Marital status: Married    Spouse name: Not on file  . Number of children: Not on file  . Years of education: Not on file  . Highest education level: Not on file  Occupational History  . Not on file  Tobacco Use  . Smoking status: Current Every Day Smoker    Packs/day: 0.25    Years: 5.00    Pack years: 1.25    Types: Cigarettes  . Smokeless tobacco: Never Used  . Tobacco comment: 3-4 cig/day  Substance and Sexual Activity  . Alcohol use: Yes    Comment: social  . Drug use: Never  . Sexual activity: Yes    Partners: Male    Birth control/protection: None  Other Topics Concern  .  Not on file  Social History Narrative  . Not on file   Social Determinants of Health   Financial Resource Strain:   . Difficulty of Paying Living Expenses:   Food Insecurity:   . Worried About Programme researcher, broadcasting/film/video in the Last Year:   . Barista in the Last Year:   Transportation Needs:   . Freight forwarder (Medical):   Marland Kitchen Lack of Transportation (Non-Medical):   Physical Activity:   . Days of Exercise per Week:   . Minutes of Exercise per Session:   Stress:   . Feeling of Stress :   Social Connections:   . Frequency of Communication with Friends and Family:   . Frequency of Social Gatherings with Friends and Family:   . Attends  Religious Services:   . Active Member of Clubs or Organizations:   . Attends Banker Meetings:   Marland Kitchen Marital Status:   Intimate Partner Violence:   . Fear of Current or Ex-Partner:   . Emotionally Abused:   Marland Kitchen Physically Abused:   . Sexually Abused:     Family History  Problem Relation Age of Onset  . Hyperlipidemia Mother   . Hypertension Mother   . Migraines Mother   . Hypertension Sister   . Hyperlipidemia Sister   . Heart failure Maternal Grandfather   . Rheum arthritis Maternal Uncle   . Breast cancer Neg Hx   . Ovarian cancer Neg Hx   . Colon cancer Neg Hx     The following portions of the patient's history were reviewed and updated as appropriate: allergies, current medications, past family history, past medical history, past social history, past surgical history and problem list.  Review of Systems  ROS negative except as noted above. Information obtained from patient.   Objective:   BP 130/86   Pulse 78   Ht 5\' 1"  (1.549 m)   Wt 148 lb 4 oz (67.2 kg)   LMP 03/22/2020 (Exact Date)   BMI 28.01 kg/m    CONSTITUTIONAL: Well-developed, well-nourished female in no acute distress.   PSYCHIATRIC: Normal mood and affect. Normal behavior. Normal judgment and thought content.  NEUROLGIC: Alert and oriented to person, place, and time. Normal muscle tone coordination. No cranial nerve deficit noted.  HENT:  Normocephalic, atraumatic, External right and left ear normal.   EYES: Conjunctivae and EOM are normal. Pupils are equal and round.   NECK: Normal range of motion, supple, no masses.  Normal thyroid.   SKIN: Skin is warm and dry. No rash noted. Not diaphoretic. No erythema. No pallor. Professional tattoos present.   CARDIOVASCULAR: Normal heart rate noted, regular rhythm, no murmur.  RESPIRATORY: Clear to auscultation bilaterally. Effort and breath sounds normal, no problems with respiration noted.  BREASTS: Symmetric in size. No masses, skin  changes, nipple drainage, or lymphadenopathy.  ABDOMEN: Soft, normal bowel sounds, no distention noted.  No tenderness, rebound or guarding.   PELVIC:  External Genitalia: Normal  Vagina: Normal  Cervix: Normal, Pap collected  Uterus: Normal  Adnexa: Normal  MUSCULOSKELETAL: Normal range of motion. No tenderness.  No cyanosis, clubbing, or edema.  2+ distal pulses.  LYMPHATIC: No Axillary, Supraclavicular, or Inguinal Adenopathy.  GAD 7 : Generalized Anxiety Score 04/13/2020 10/26/2019 06/14/2019 11/24/2017  Nervous, Anxious, on Edge 0 0 0 3  Control/stop worrying 1 0 1 1  Worry too much - different things 0 0 1 3  Trouble relaxing 0 0 0 3  Restless 1  0 0 1  Easily annoyed or irritable 1 0 1 3  Afraid - awful might happen 0 0 0 1  Total GAD 7 Score 3 0 3 15  Anxiety Difficulty Not difficult at all - Not difficult at all Somewhat difficult     Assessment:   Annual gynecologic examination 24 y.o.   Contraception: none   Overweight   Problem List Items Addressed This Visit    None    Visit Diagnoses    Well woman exam    -  Primary   Relevant Orders   Cytology - PAP   Anxiety and depression       Screening for cervical cancer       Relevant Orders   Cytology - PAP      Plan:   Pap: Pap, Reflex if ASCUS  Labs: Will return for fasting labs, see orders   Routine preventative health maintenance measures emphasized: Preparing pregnancy, Exercise/Diet/Weight control, Tobacco Warnings, Alcohol/Substance use risks, Stress Management and Peer Pressure Issues   Rx Zoloft, see orders  Return to Clinic - 1 Year for [employer] or sooner if needed   Diona Fanti, CNM [facility], [facility], [facility] 04/13/20 3:31 PM

## 2020-04-13 NOTE — Patient Instructions (Signed)
Preparing for Pregnancy If you are considering becoming pregnant, make an appointment to see your regular health care provider to learn how to prepare for a safe and healthy pregnancy (preconception care). During a preconception care visit, your health care provider will:  Do a complete physical exam, including a Pap test.  Take a complete medical history.  Give you information, answer your questions, and help you resolve problems. Preconception checklist Medical history  Tell your health care provider about any current or past medical conditions. Your pregnancy or your ability to become pregnant may be affected by chronic conditions, such as diabetes, chronic hypertension, and thyroid problems.  Include your family's medical history as well as your partner's medical history.  Tell your health care provider about any history of STIs (sexually transmitted infections).These can affect your pregnancy. In some cases, they can be passed to your baby. Discuss any concerns that you have about STIs.  If indicated, discuss the benefits of genetic testing. This testing will show whether there are any genetic conditions that may be passed from you or your partner to your baby.  Tell your health care provider about: ? Any problems you have had with conception or pregnancy. ? Any medicines you take. These include vitamins, herbal supplements, and over-the-counter medicines. ? Your history of immunizations. Discuss any vaccinations that you may need. Diet  Ask your health care provider what to include in a healthy diet that has a balance of nutrients. This is especially important when you are pregnant or preparing to become pregnant.  Ask your health care provider to help you reach a healthy weight before pregnancy. ? If you are overweight, you may be at higher risk for certain complications, such as high blood pressure, diabetes, and preterm birth. ? If you are underweight, you are more likely to  have a baby who has a low birth weight. Lifestyle, work, and home  Let your health care provider know: ? About any lifestyle habits that you have, such as alcohol use, drug use, or smoking. ? About recreational activities that may put you at risk during pregnancy, such as downhill skiing and certain exercise programs. ? Tell your health care provider about any international travel, especially any travel to places with an active [place] virus outbreak. ? About harmful substances that you may be exposed to at work or at home. These include chemicals, pesticides, radiation, or even litter boxes. ? If you do not feel safe at home. Mental health  Tell your health care provider about: ? Any history of mental health conditions, including feelings of depression, sadness, or anxiety. ? Any medicines that you take for a mental health condition. These include herbs and supplements. Home instructions to prepare for pregnancy Lifestyle   Eat a balanced diet. This includes fresh fruits and vegetables, whole grains, lean meats, low-fat dairy products, healthy fats, and foods that are high in fiber. Ask to meet with a nutritionist or registered dietitian for assistance with meal planning and goals.  Get regular exercise. Try to be active for at least 30 minutes a day on most days of the week. Ask your health care provider which activities are safe during pregnancy.  Do not use any products that contain nicotine or tobacco, such as cigarettes and e-cigarettes. If you need help quitting, ask your health care provider.  Do not drink alcohol.  Do not take illegal drugs.  Maintain a healthy weight. Ask your health care provider what weight range is right for you. General  instructions  Keep an accurate record of your menstrual periods. This makes it easier for your health care provider to determine your baby's due date.  Begin taking prenatal vitamins and folic acid supplements daily as directed by your  health care provider.  Manage any chronic conditions, such as high blood pressure and diabetes, as told by your health care provider. This is important. How do I know that I am pregnant? You may be pregnant if you have been sexually active and you miss your period. Symptoms of early pregnancy include:  Mild cramping.  Very light vaginal bleeding (spotting).  Feeling unusually tired.  Nausea and vomiting (morning sickness). If you have any of these symptoms and you suspect that you might be pregnant, you can take a home pregnancy test. These tests check for a hormone in your urine (human chorionic gonadotropin, or hCG). A woman's body begins to make this hormone during early pregnancy. These tests are very accurate. Wait until at least the first day after you miss your period to take one. If the test shows that you are pregnant (you get a positive result), call your health care provider to make an appointment for prenatal care. What should I do if I become pregnant?      Make an appointment with your health care provider as soon as you suspect you are pregnant.  Do not use any products that contain nicotine, such as cigarettes, chewing tobacco, and e-cigarettes. If you need help quitting, ask your health care provider.  Do not drink alcoholic beverages. Alcohol is related to a number of birth defects.  Avoid toxic odors and chemicals.  You may continue to have sexual intercourse if it does not cause pain or other problems, such as vaginal bleeding. This information is not intended to replace advice given to you by your health care provider. Make sure you discuss any questions you have with your health care provider. Document Revised: 12/11/2017 Document Reviewed: 06/30/2016 Elsevier Patient Education  2020 [place] 59-72 Years Old, Female Preventive care refers to visits with your health care provider and lifestyle choices that can promote health and wellness.  This includes:  A yearly physical exam. This may also be called an annual well check.  Regular dental visits and eye exams.  Immunizations.  Screening for certain conditions.  Healthy lifestyle choices, such as eating a healthy diet, getting regular exercise, not using drugs or products that contain nicotine and tobacco, and limiting alcohol use. What can I expect for my preventive care visit? Physical exam Your health care provider will check your:  Height and weight. This may be used to calculate body mass index (BMI), which tells if you are at a healthy weight.  Heart rate and blood pressure.  Skin for abnormal spots. Counseling Your health care provider may ask you questions about your:  Alcohol, tobacco, and drug use.  Emotional well-being.  Home and relationship well-being.  Sexual activity.  Eating habits.  Work and work Statistician.  Method of birth control.  Menstrual cycle.  Pregnancy history. What immunizations do I need?  Influenza (flu) vaccine  This is recommended every year. Tetanus, diphtheria, and pertussis (Tdap) vaccine  You may need a Td booster every 10 years. Varicella (chickenpox) vaccine  You may need this if you have not been vaccinated. Human papillomavirus (HPV) vaccine  If recommended by your health care provider, you may need three doses over 6 months. Measles, mumps, and rubella (MMR) vaccine  You  may need at least one dose of MMR. You may also need a second dose. Meningococcal conjugate (MenACWY) vaccine  One dose is recommended if you are age 52-21 years and a first-year college student living in a residence hall, or if you have one of several medical conditions. You may also need additional booster doses. Pneumococcal conjugate (PCV13) vaccine  You may need this if you have certain conditions and were not previously vaccinated. Pneumococcal polysaccharide (PPSV23) vaccine  You may need one or two doses if you smoke  cigarettes or if you have certain conditions. Hepatitis A vaccine  You may need this if you have certain conditions or if you travel or work in places where you may be exposed to hepatitis A. Hepatitis B vaccine  You may need this if you have certain conditions or if you travel or work in places where you may be exposed to hepatitis B. Haemophilus influenzae type b (Hib) vaccine  You may need this if you have certain conditions. You may receive vaccines as individual doses or as more than one vaccine together in one shot (combination vaccines). Talk with your health care provider about the risks and benefits of combination vaccines. What tests do I need?  Blood tests  Lipid and cholesterol levels. These may be checked every 5 years starting at age 29.  Hepatitis C test.  Hepatitis B test. Screening  Diabetes screening. This is done by checking your blood sugar (glucose) after you have not eaten for a while (fasting).  Sexually transmitted disease (STD) testing.  BRCA-related cancer screening. This may be done if you have a family history of breast, ovarian, tubal, or peritoneal cancers.  Pelvic exam and Pap test. This may be done every 3 years starting at age 59. Starting at age 52, this may be done every 5 years if you have a Pap test in combination with an HPV test. Talk with your health care provider about your test results, treatment options, and if necessary, the need for more tests. Follow these instructions at home: Eating and drinking   Eat a diet that includes fresh fruits and vegetables, whole grains, lean protein, and low-fat dairy.  Take vitamin and mineral supplements as recommended by your health care provider.  Do not drink alcohol if: ? Your health care provider tells you not to drink. ? You are pregnant, may be pregnant, or are planning to become pregnant.  If you drink alcohol: ? Limit how much you have to 0-1 drink a day. ? Be aware of how much alcohol  is in your drink. In the [place] one drink equals one 12 oz bottle of beer (355 mL), one 5 oz glass of wine (148 mL), or one 1 oz glass of hard liquor (44 mL). Lifestyle  Take daily care of your teeth and gums.  Stay active. Exercise for at least 30 minutes on 5 or more days each week.  Do not use any products that contain nicotine or tobacco, such as cigarettes, e-cigarettes, and chewing tobacco. If you need help quitting, ask your health care provider.  If you are sexually active, practice safe sex. Use a condom or other form of birth control (contraception) in order to prevent pregnancy and STIs (sexually transmitted infections). If you plan to become pregnant, see your health care provider for a preconception visit. What's next?  Visit your health care provider once a year for a well check visit.  Ask your health care provider how often you should have your  eyes and teeth checked.  Stay up to date on all vaccines. This information is not intended to replace advice given to you by your health care provider. Make sure you discuss any questions you have with your health care provider. Document Revised: 08/20/2018 Document Reviewed: 08/20/2018 Elsevier Patient Education  2020 [employer].

## 2020-04-17 ENCOUNTER — Other Ambulatory Visit: Payer: Self-pay

## 2020-04-17 ENCOUNTER — Other Ambulatory Visit: Payer: Managed Care, Other (non HMO)

## 2020-04-17 DIAGNOSIS — Z3201 Encounter for pregnancy test, result positive: Secondary | ICD-10-CM

## 2020-04-17 DIAGNOSIS — Z01419 Encounter for gynecological examination (general) (routine) without abnormal findings: Secondary | ICD-10-CM

## 2020-04-17 LAB — CYTOLOGY - PAP: Diagnosis: NEGATIVE

## 2020-04-18 ENCOUNTER — Telehealth: Payer: Self-pay | Admitting: Certified Nurse Midwife

## 2020-04-18 LAB — LIPID PANEL
Chol/HDL Ratio: 3.2 ratio (ref 0.0–4.4)
Cholesterol, Total: 200 mg/dL — ABNORMAL HIGH (ref 100–199)
HDL: 62 mg/dL (ref >39)
LDL Chol Calc (NIH): 121 mg/dL — ABNORMAL HIGH (ref 0–99)
Triglycerides: 97 mg/dL (ref 0–149)
VLDL Cholesterol Cal: 17 mg/dL (ref 5–40)

## 2020-04-18 LAB — CBC
Hematocrit: 41.6 % (ref 34.0–46.6)
Hemoglobin: 14.1 g/dL (ref 11.1–15.9)
MCH: 29.8 pg (ref 26.6–33.0)
MCHC: 33.9 g/dL (ref 31.5–35.7)
MCV: 88 fL (ref 79–97)
Platelets: 309 10*3/uL (ref 150–450)
RBC: 4.73 x10E6/uL (ref 3.77–5.28)
RDW: 13.6 % (ref 11.7–15.4)
WBC: 6.8 10*3/uL (ref 3.4–10.8)

## 2020-04-18 LAB — BETA HCG QUANT (REF LAB): hCG Quant: 102 m[IU]/mL

## 2020-04-18 LAB — PROGESTERONE: Progesterone: 24.6 ng/mL

## 2020-04-18 LAB — TSH: TSH: 2.15 u[IU]/mL (ref 0.450–4.500)

## 2020-04-18 LAB — GLUCOSE, RANDOM: Glucose: 78 mg/dL (ref 65–99)

## 2020-04-18 NOTE — Telephone Encounter (Signed)
Pt called in and stated that she got her labs and she found out she was pregnant. I went and looked I cant read the labs. I am sending a message for the pt wanting to know the next appt. I know she will have a nurse intake. I just wanted to make sure pt is pregnant and how far a long or will an order for u/s need to be placed. Please advise

## 2020-04-22 ENCOUNTER — Other Ambulatory Visit: Payer: Self-pay | Admitting: Allergy

## 2020-04-22 ENCOUNTER — Other Ambulatory Visit: Payer: Self-pay | Admitting: Internal Medicine

## 2020-04-24 ENCOUNTER — Ambulatory Visit: Payer: Managed Care, Other (non HMO) | Admitting: Internal Medicine

## 2020-05-01 ENCOUNTER — Other Ambulatory Visit: Payer: Self-pay | Admitting: Certified Nurse Midwife

## 2020-05-01 DIAGNOSIS — Z789 Other specified health status: Secondary | ICD-10-CM

## 2020-05-08 ENCOUNTER — Other Ambulatory Visit: Payer: Self-pay | Admitting: Allergy

## 2020-05-09 ENCOUNTER — Other Ambulatory Visit: Payer: Self-pay

## 2020-05-09 ENCOUNTER — Encounter: Payer: Self-pay | Admitting: Allergy & Immunology

## 2020-05-09 ENCOUNTER — Ambulatory Visit (INDEPENDENT_AMBULATORY_CARE_PROVIDER_SITE_OTHER): Payer: Managed Care, Other (non HMO) | Admitting: Allergy & Immunology

## 2020-05-09 VITALS — BP 120/78 | HR 82 | Temp 98.2°F | Resp 16 | Ht 61.0 in | Wt 149.2 lb

## 2020-05-09 DIAGNOSIS — O99519 Diseases of the respiratory system complicating pregnancy, unspecified trimester: Secondary | ICD-10-CM

## 2020-05-09 DIAGNOSIS — L508 Other urticaria: Secondary | ICD-10-CM | POA: Diagnosis not present

## 2020-05-09 DIAGNOSIS — T7800XD Anaphylactic reaction due to unspecified food, subsequent encounter: Secondary | ICD-10-CM | POA: Diagnosis not present

## 2020-05-09 DIAGNOSIS — J310 Chronic rhinitis: Secondary | ICD-10-CM

## 2020-05-09 MED ORDER — AZELASTINE HCL 0.1 % NA SOLN: 1.0000 | Freq: Two times a day (BID) | NASAL | 12 refills | Status: DC

## 2020-05-09 NOTE — Progress Notes (Signed)
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 Pregnancy rhinitis - Stop the Afrin for sure.  - If the Tylenol Sinus is not working (which contains phenylephrine), I do not think that there is another oral medication that would work and is safe for the baby.  - Continue with nasal saline rinses twice daily BEFORE your nose sprays. - Add on Nasacort one spray per nostril twice daily. - Add on Astelin one spray per nostril twice daily. - Give [place] an update in 2-4 weeks. - We will get an environmental allergy test to see if you have evidence of environmental allergies. - There are no studies on any nasal steroid during pregnancy aside from Rhinocort, but realistically any nasal steroid should be fine. - We did discuss the risk and benefits of nasal sprays during pregnancy.  2. Return in about 6 months (around 11/09/2020). This can be an in-person, a virtual Webex or a telephone follow up visit.   Subjective:   Dana Hood is a 24 y.o. female presenting today for follow up of  Chief Complaint  Patient presents with  . Allergic Rhinitis     allergies are acting up, [redacted] weeks pregnant, using nasal saline rinses, and Afrin, benadryl  . Sinus Problem    stuffy nose using Afrin daily    Dana Hood has a history of the following: Patient Active Problem List   Diagnosis Date Noted  . Anxiety 10/26/2019  . GERD (gastroesophageal reflux disease) 10/26/2019  . Hypertension 10/26/2019  . Allergic urticaria history of and currently resolved  09/21/2019  . Migraines 01/30/2017    History obtained from: chart review and patient.  Dana Hood is a 24 y.o. female presenting for a follow up visit. She was evaluated for hives and diagnosed with alpha gal syndrome; avoidance of red meat has  helped with that.  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 An environmental allergy panel demonstrated high grass pollen, dog dander, cat, tree, and weed pollen.  Since the last visit, she reports that she has been suffering for six weeks. She reports congestion and ear pressure. She is trying to remedy it on her own but nothing is working. She has been using Afrin without much relief. Tylenol Sinus with phenylephrine does not help much at a all. She reports that she is having a lot of congestion.   She was diagnosed with a sinus infection at one point and she was prescribed Flonase without much improvement. She has not used any other nasal steroid or nasal antihistamine to her knowledge.  Otherwise, there have been no changes to her past medical history, surgical history, family history, or social history.    Review of Systems  Constitutional: Negative for chills, fever, malaise/fatigue and weight loss.  HENT: Negative.  Negative for congestion, ear discharge, ear pain, sinus pain and sore throat.   Eyes: Negative for pain, discharge and redness.  Respiratory: Negative for cough, sputum production, shortness of breath and wheezing.   Cardiovascular: Negative.  Negative for chest pain and palpitations.  Gastrointestinal: Negative for abdominal pain, constipation, diarrhea, heartburn, nausea and vomiting.  Skin: Negative.  Negative for itching and rash.  Neurological: Negative for dizziness and headaches.  Endo/Heme/Allergies: Negative for environmental allergies. Does not bruise/bleed easily.  Objective:   Blood pressure 120/78, pulse 82, temperature 98.2 F (36.8 C), temperature source Temporal, resp. rate 16, height 5\' 1"  (1.549 m), weight 149 lb 3.2 oz (67.7 kg), last menstrual period 03/22/2020, SpO2 99 %. Body mass index is 28.19 kg/m.   Physical Exam:  Physical Exam  Constitutional: She appears well-developed.  HENT:   Head: Normocephalic and atraumatic.  Right Ear: Tympanic membrane, external ear and ear canal normal.  Left Ear: Tympanic membrane and ear canal normal.  Nose: No mucosal edema, rhinorrhea, nasal deformity or septal deviation. No epistaxis. Right sinus exhibits no maxillary sinus tenderness and no frontal sinus tenderness. Left sinus exhibits no maxillary sinus tenderness and no frontal sinus tenderness.  Mouth/Throat: Uvula is midline and oropharynx is clear and moist. Mucous membranes are not pale and not dry.  Turbinates enlarged bilaterally.  Clear rhinorrhea bilaterally.  Eyes: Pupils are equal, round, and reactive to light. Conjunctivae and EOM are normal. Right eye exhibits no chemosis and no discharge. Left eye exhibits no chemosis and no discharge. Right conjunctiva is not injected. Left conjunctiva is not injected.  Cardiovascular: Normal rate, regular rhythm and normal heart sounds.  Respiratory: Effort normal and breath sounds normal. No accessory muscle usage. No tachypnea. No respiratory distress. She has no wheezes. She has no rhonchi. She has no rales. She exhibits no tenderness.  Neurological: She is alert.  Skin: No abrasion, no petechiae and no rash noted. Rash is not papular, not vesicular and not urticarial. No erythema. No pallor.  Psychiatric: She has a normal mood and affect.     Diagnostic studies: labs sent instead      Salvatore Marvel, MD  [facility] [place] [facility]

## 2020-05-09 NOTE — Patient Instructions (Addendum)
1. Pregnancy rhinitis - Stop the Afrin for sure.  - If the Tylenol Sinus is not working (which contains phenylephrine), I do not think that there is another oral medication that would work and is safe for the baby.  - Continue with nasal saline rinses twice daily BEFORE your nose sprays. - Add on Nasacort one spray per nostril twice daily. - Add on Astelin one spray per nostril twice daily. - Give [place] an update in 2-4 weeks. - We will get an environmental allergy test to see if you have evidence of environmental allergies.  2. Return in about 6 months (around 11/09/2020). This can be an in-person, a virtual Webex or a telephone follow up visit.   Please inform us of any Emergency Department visits, hospitalizations, or changes in symptoms. Call us before going to the ED for breathing or allergy symptoms since we might be able to fit you in for a sick visit. Feel free to contact us anytime with any questions, problems, or concerns.  It was a pleasure to meet you today!  Websites that have reliable patient information: 1. American Academy of Asthma, Allergy, and Immunology: www.aaaai.org 2. Food Allergy Research and Education (FARE): foodallergy.org 3. Mothers of Asthmatics: http://www.asthmacommunitynetwork.org 4. American College of Allergy, Asthma, and Immunology: www.acaai.org   COVID-19 Vaccine Information can be found at: PodExchange.[email or web address] For questions related to vaccine distribution or appointments, please email [email or web address] or call [phone].     "Like" [place] on Facebook and Instagram for our latest updates!       HAPPY SPRING!  Make sure you are registered to vote! If you have moved or changed any of your contact information, you will need to get this updated before voting!  In some cases, you MAY be able to register to vote online: [email or web address]

## 2020-05-09 NOTE — Telephone Encounter (Signed)
Patient has been schedule with Dr. Dellis Anes today, 05/09/2020 at 11am.

## 2020-05-10 ENCOUNTER — Encounter: Payer: Self-pay | Admitting: Allergy & Immunology

## 2020-05-10 LAB — IGE+ALLERGENS ZONE 2(30)

## 2020-05-11 ENCOUNTER — Telehealth: Payer: Self-pay | Admitting: Certified Nurse Midwife

## 2020-05-11 ENCOUNTER — Other Ambulatory Visit: Payer: Self-pay

## 2020-05-11 ENCOUNTER — Ambulatory Visit (INDEPENDENT_AMBULATORY_CARE_PROVIDER_SITE_OTHER): Payer: Managed Care, Other (non HMO) | Admitting: Certified Nurse Midwife

## 2020-05-11 VITALS — BP 121/82 | HR 87 | Ht 61.0 in | Wt 151.1 lb

## 2020-05-11 DIAGNOSIS — Z0283 Encounter for blood-alcohol and blood-drug test: Secondary | ICD-10-CM

## 2020-05-11 DIAGNOSIS — Z113 Encounter for screening for infections with a predominantly sexual mode of transmission: Secondary | ICD-10-CM

## 2020-05-11 DIAGNOSIS — Z3401 Encounter for supervision of normal first pregnancy, first trimester: Secondary | ICD-10-CM

## 2020-05-11 NOTE — Patient Instructions (Signed)
WHAT OB PATIENTS CAN EXPECT   Confirmation of pregnancy and ultrasound ordered if medically indicated-[redacted] weeks gestation  New OB (NOB) intake with nurse and New OB (NOB) labs- [redacted] weeks gestation  New OB (NOB) physical examination with provider- 11/[redacted] weeks gestation  Flu vaccine-[redacted] weeks gestation  Anatomy scan-[redacted] weeks gestation  Glucose tolerance test, blood work to test for anemia, T-dap vaccine-[redacted] weeks gestation  Vaginal swabs/cultures-STD/Group B strep-[redacted] weeks gestation  Appointments every 4 weeks until 28 weeks  Every 2 weeks from 28 weeks until 36 weeks  Weekly visits from 19 weeks until delivery  First Trimester of Pregnancy  The first trimester of pregnancy is from week 1 until the end of week 13 (months 1 through 3). During this time, your baby will begin to develop inside you. At 6-8 weeks, the eyes and face are formed, and the heartbeat can be seen on ultrasound. At the end of 12 weeks, all the baby's organs are formed. Prenatal care is all the medical care you receive before the birth of your baby. Make sure you get good prenatal care and follow all of your doctor's instructions. Follow these instructions at home: Medicines  Take over-the-counter and prescription medicines only as told by your doctor. Some medicines are safe and some medicines are not safe during pregnancy.  Take a prenatal vitamin that contains at least 600 micrograms (mcg) of folic acid.  If you have trouble pooping (constipation), take medicine that will make your stool soft (stool softener) if your doctor approves. Eating and drinking   Eat regular, healthy meals.  Your doctor will tell you the amount of weight gain that is right for you.  Avoid raw meat and uncooked cheese.  If you feel sick to your stomach (nauseous) or throw up (vomit): ? Eat 4 or 5 small meals a day instead of 3 large meals. ? Try eating a few soda crackers. ? Drink liquids between meals instead of during  meals.  To prevent constipation: ? Eat foods that are high in fiber, like fresh fruits and vegetables, whole grains, and beans. ? Drink enough fluids to keep your pee (urine) clear or pale yellow. Activity  Exercise only as told by your doctor. Stop exercising if you have cramps or pain in your lower belly (abdomen) or low back.  Do not exercise if it is too hot, too humid, or if you are in a place of great height (high altitude).  Try to avoid standing for long periods of time. Move your legs often if you must stand in one place for a long time.  Avoid heavy lifting.  Wear low-heeled shoes. Sit and stand up straight.  You can have sex unless your doctor tells you not to. Relieving pain and discomfort  Wear a good support bra if your breasts are sore.  Take warm water baths (sitz baths) to soothe pain or discomfort caused by hemorrhoids. Use hemorrhoid cream if your doctor says it is okay.  Rest with your legs raised if you have leg cramps or low back pain.  If you have puffy, bulging veins (varicose veins) in your legs: ? Wear support hose or compression stockings as told by your doctor. ? Raise (elevate) your feet for 15 minutes, 3-4 times a day. ? Limit salt in your food. Prenatal care  Schedule your prenatal visits by the twelfth week of pregnancy.  Write down your questions. Take them to your prenatal visits.  Keep all your prenatal visits as told by your  doctor. This is important. Safety  Wear your seat belt at all times when driving.  Make a list of emergency phone numbers. The list should include numbers for family, friends, the hospital, and police and fire departments. General instructions  Ask your doctor for a referral to a local prenatal class. Begin classes no later than at the start of month 6 of your pregnancy.  Ask for help if you need counseling or if you need help with nutrition. Your doctor can give you advice or tell you where to go for help.  Do  not use hot tubs, steam rooms, or saunas.  Do not douche or use tampons or scented sanitary pads.  Do not cross your legs for long periods of time.  Avoid all herbs and alcohol. Avoid drugs that are not approved by your doctor.  Do not use any tobacco products, including cigarettes, chewing tobacco, and electronic cigarettes. If you need help quitting, ask your doctor. You may get counseling or other support to help you quit.  Avoid cat litter boxes and soil used by cats. These carry germs that can cause birth defects in the baby and can cause a loss of your baby (miscarriage) or stillbirth.  Visit your dentist. At home, brush your teeth with a soft toothbrush. Be gentle when you floss. Contact a doctor if:  You are dizzy.  You have mild cramps or pressure in your lower belly.  You have a nagging pain in your belly area.  You continue to feel sick to your stomach, you throw up, or you have watery poop (diarrhea).  You have a bad smelling fluid coming from your vagina.  You have pain when you pee (urinate).  You have increased puffiness (swelling) in your face, hands, legs, or ankles. Get help right away if:  You have a fever.  You are leaking fluid from your vagina.  You have spotting or bleeding from your vagina.  You have very bad belly cramping or pain.  You gain or lose weight rapidly.  You throw up blood. It may look like coffee grounds.  You are around people who have [place] measles, fifth disease, or chickenpox.  You have a very bad headache.  You have shortness of breath.  You have any kind of trauma, such as from a fall or a car accident. Summary  The first trimester of pregnancy is from week 1 until the end of week 13 (months 1 through 3).  To take care of yourself and your unborn baby, you will need to eat healthy meals, take medicines only if your doctor tells you to do so, and do activities that are safe for you and your baby.  Keep all follow-up  visits as told by your doctor. This is important as your doctor will have to ensure that your baby is healthy and growing well. This information is not intended to replace advice given to you by your health care provider. Make sure you discuss any questions you have with your health care provider. Document Revised: 04/01/2019 Document Reviewed: 12/17/2016 Elsevier Patient Education  [place]. Morning Sickness  Morning sickness is when you feel sick to your stomach (nauseous) during pregnancy. You may feel sick to your stomach and throw up (vomit). You may feel sick in the morning, but you can feel this way at any time of day. Some women feel very sick to their stomach and cannot stop throwing up (hyperemesis gravidarum). Follow these instructions at home: Medicines  Take  over-the-counter and prescription medicines only as told by your doctor. Do not take any medicines until you talk with your doctor about them first.  Taking multivitamins before getting pregnant can stop or lessen the harshness of morning sickness. Eating and drinking  Eat dry toast or crackers before getting out of bed.  Eat 5 or 6 small meals a day.  Eat dry and bland foods like rice and baked potatoes.  Do not eat greasy, fatty, or spicy foods.  Have someone cook for you if the smell of food causes you to feel sick or throw up.  If you feel sick to your stomach after taking prenatal vitamins, take them at night or with a snack.  Eat protein when you need a snack. Nuts, yogurt, and cheese are good choices.  Drink fluids throughout the day.  Try ginger ale made with real ginger, ginger tea made from fresh grated ginger, or ginger candies. General instructions  Do not use any products that have nicotine or tobacco in them, such as cigarettes and e-cigarettes. If you need help quitting, ask your doctor.  Use an air purifier to keep the air in your house free of smells.  Get lots of fresh air.  Try to  avoid smells that make you feel sick.  Try: ? Wearing a bracelet that is used for seasickness (acupressure wristband). ? Going to a doctor who puts thin needles into certain body points (acupuncture) to improve how you feel. Contact a doctor if:  You need medicine to feel better.  You feel dizzy or light-headed.  You are losing weight. Get help right away if:  You feel very sick to your stomach and cannot stop throwing up.  You pass out (faint).  You have very bad pain in your belly. Summary  Morning sickness is when you feel sick to your stomach (nauseous) during pregnancy.  You may feel sick in the morning, but you can feel this way at any time of day.  Making some changes to what you eat may help your symptoms go away. This information is not intended to replace advice given to you by your health care provider. Make sure you discuss any questions you have with your health care provider. Document Revised: 11/21/2017 Document Reviewed: 01/09/2017 Elsevier Patient Education  2020 [employer]. How a Baby Grows During Pregnancy  Pregnancy begins when a female's sperm enters a female's egg (fertilization). Fertilization usually happens in one of the tubes (fallopian tubes) that connect the ovaries to the womb (uterus). The fertilized egg moves down the fallopian tube to the uterus. Once it reaches the uterus, it implants into the lining of the uterus and begins to grow. For the first 10 weeks, the fertilized egg is called an embryo. After 10 weeks, it is called a fetus. As the fetus continues to grow, it receives oxygen and nutrients through tissue (placenta) that grows to support the developing baby. The placenta is the life support system for the baby. It provides oxygen and nutrition and removes waste. Learning as much as you can about your pregnancy and how your baby is developing can help you enjoy the experience. It can also make you aware of when there might be a problem and when  to ask questions. How long does a typical pregnancy last? A pregnancy usually lasts 280 days, or about 40 weeks. Pregnancy is divided into three periods of growth, also called trimesters:  First trimester: 0-12 weeks.  Second trimester: 13-27 weeks.  Third trimester:  28-40 weeks. The day when your baby is ready to be born (full term) is your estimated date of delivery. How does my baby develop month by month? First month  The fertilized egg attaches to the inside of the uterus.  Some cells will form the placenta. Others will form the fetus.  The arms, legs, brain, spinal cord, lungs, and heart begin to develop.  At the end of the first month, the heart begins to beat. Second month  The bones, inner ear, eyelids, hands, and feet form.  The genitals develop.  By the end of 8 weeks, all major organs are developing. Third month  All of the internal organs are forming.  Teeth develop below the gums.  Bones and muscles begin to grow. The spine can flex.  The skin is transparent.  Fingernails and toenails begin to form.  Arms and legs continue to grow longer, and hands and feet develop.  The fetus is about 3 inches (7.6 cm) long. Fourth month  The placenta is completely formed.  The external sex organs, neck, outer ear, eyebrows, eyelids, and fingernails are formed.  The fetus can hear, swallow, and move its arms and legs.  The kidneys begin to produce urine.  The skin is covered with a white, waxy coating (vernix) and very fine hair (lanugo). Fifth month  The fetus moves around more and can be felt for the first time (quickening).  The fetus starts to sleep and wake up and may begin to suck its finger.  The nails grow to the end of the fingers.  The organ in the digestive system that makes bile (gallbladder) functions and helps to digest nutrients.  If your baby is a girl, eggs are present in her ovaries. If your baby is a boy, testicles start to move down  into his scrotum. Sixth month  The lungs are formed.  The eyes open. The brain continues to develop.  Your baby has fingerprints and toe prints. Your baby's hair grows thicker.  At the end of the second trimester, the fetus is about 9 inches (22.9 cm) long. Seventh month  The fetus kicks and stretches.  The eyes are developed enough to sense changes in light.  The hands can make a grasping motion.  The fetus responds to sound. Eighth month  All organs and body systems are fully developed and functioning.  Bones harden, and taste buds develop. The fetus may hiccup.  Certain areas of the brain are still developing. The skull remains soft. Ninth month  The fetus gains about  lb (0.23 kg) each week.  The lungs are fully developed.  Patterns of sleep develop.  The fetus's head typically moves into a head-down position (vertex) in the uterus to prepare for birth.  The fetus weighs 6-9 lb (2.72-4.08 kg) and is 19-20 inches (48.26-50.8 cm) long. What can I do to have a healthy pregnancy and help my baby develop? General instructions  Take prenatal vitamins as directed by your health care provider. These include vitamins such as folic acid, iron, calcium, and vitamin D. They are important for healthy development.  Take medicines only as directed by your health care provider. Read labels and ask a pharmacist or your health care provider whether over-the-counter medicines, supplements, and prescription drugs are safe to take during pregnancy.  Keep all follow-up visits as directed by your health care provider. This is important. Follow-up visits include prenatal care and screening tests. How do I know if my baby is developing well?  At each prenatal visit, your health care provider will do several different tests to check on your health and keep track of your baby's development. These include:  Fundal height and position. ? Your health care provider will measure your growing  belly from your pubic bone to the top of the uterus using a tape measure. ? Your health care provider will also feel your belly to determine your baby's position.  Heartbeat. ? An ultrasound in the first trimester can confirm pregnancy and show a heartbeat, depending on how far along you are. ? Your health care provider will check your baby's heart rate at every prenatal visit.  Second trimester ultrasound. ? This ultrasound checks your baby's development. It also may show your baby's gender. What should I do if I have concerns about my baby's development? Always talk with your health care provider about any concerns that you may have about your pregnancy and your baby. Summary  A pregnancy usually lasts 280 days, or about 40 weeks. Pregnancy is divided into three periods of growth, also called trimesters.  Your health care provider will monitor your baby's growth and development throughout your pregnancy.  Follow your health care provider's recommendations about taking prenatal vitamins and medicines during your pregnancy.  Talk with your health care provider if you have any concerns about your pregnancy or your developing baby. This information is not intended to replace advice given to you by your health care provider. Make sure you discuss any questions you have with your health care provider. Document Revised: 04/01/2019 Document Reviewed: 10/22/2017 Elsevier Patient Education  2020 [employer]. Commonly Asked Questions During Pregnancy  Cats: A parasite can be excreted in cat feces.  To avoid exposure you need to have another person empty the little box.  If you must empty the litter box you will need to wear gloves.  Wash your hands after handling your cat.  This parasite can also be found in raw or undercooked meat so this should also be avoided.  Colds, Sore Throats, Flu: Please check your medication sheet to see what you can take for symptoms.  If your symptoms are unrelieved  by these medications please call the office.  Dental Work: Most any dental work Investment banker, corporate recommends is permitted.  X-rays should only be taken during the first trimester if absolutely necessary.  Your abdomen should be shielded with a lead apron during all x-rays.  Please notify your provider prior to receiving any x-rays.  Novocaine is fine; gas is not recommended.  If your dentist requires a note from [place] prior to dental work please call the office and we will provide one for you.  Exercise: Exercise is an important part of staying healthy during your pregnancy.  You may continue most exercises you were accustomed to prior to pregnancy.  Later in your pregnancy you will most likely notice you have difficulty with activities requiring balance like riding a bicycle.  It is important that you listen to your body and avoid activities that put you at a higher risk of falling.  Adequate rest and staying well hydrated are a must!  If you have questions about the safety of specific activities ask your provider.    Exposure to Children with illness: Try to avoid obvious exposure; report any symptoms to [place] when noted,  If you have chicken pos, red measles or mumps, you should be immune to these diseases.   Please do not take any vaccines while pregnant unless you have checked  with your OB provider.  Fetal Movement: After 28 weeks we recommend you do "kick counts" twice daily.  Lie or sit down in a calm quiet environment and count your baby movements "kicks".  You should feel your baby at least 10 times per hour.  If you have not felt 10 kicks within the first hour get up, walk around and have something sweet to eat or drink then repeat for an additional hour.  If count remains less than 10 per hour notify your provider.  Fumigating: Follow your pest control agent's advice as to how long to stay out of your home.  Ventilate the area well before re-entering.  Hemorrhoids:   Most over-the-counter preparations  can be used during pregnancy.  Check your medication to see what is safe to use.  It is important to use a stool softener or fiber in your diet and to drink lots of liquids.  If hemorrhoids seem to be getting worse please call the office.   Hot Tubs:  Hot tubs Jacuzzis and saunas are not recommended while pregnant.  These increase your internal body temperature and should be avoided.  Intercourse:  Sexual intercourse is safe during pregnancy as long as you are comfortable, unless otherwise advised by your provider.  Spotting may occur after intercourse; report any bright red bleeding that is heavier than spotting.  Labor:  If you know that you are in labor, please go to the hospital.  If you are unsure, please call the office and let us help you decide what to do.  Lifting, straining, etc:  If your job requires heavy lifting or straining please check with your provider for any limitations.  Generally, you should not lift items heavier than that you can lift simply with your hands and arms (no back muscles)  Painting:  Paint fumes do not harm your pregnancy, but may make you ill and should be avoided if possible.  Latex or water based paints have less odor than oils.  Use adequate ventilation while painting.  Permanents & Hair Color:  Chemicals in hair dyes are not recommended as they cause increase hair dryness which can increase hair loss during pregnancy.  " Highlighting" and permanents are allowed.  Dye may be absorbed differently and permanents may not hold as well during pregnancy.  Sunbathing:  Use a sunscreen, as skin burns easily during pregnancy.  Drink plenty of fluids; avoid over heating.  Tanning Beds:  Because their possible side effects are still unknown, tanning beds are not recommended.  Ultrasound Scans:  Routine ultrasounds are performed at approximately 20 weeks.  You will be able to see your baby's general anatomy an if you would like to know the gender this can usually be  determined as well.  If it is questionable when you conceived you may also receive an ultrasound early in your pregnancy for dating purposes.  Otherwise ultrasound exams are not routinely performed unless there is a medical necessity.  Although you can request a scan we ask that you pay for it when conducted because insurance does not cover " patient request" scans.  Work: If your pregnancy proceeds without complications you may work until your due date, unless your physician or employer advises otherwise.  Round Ligament Pain/Pelvic Discomfort:  Sharp, shooting pains not associated with bleeding are fairly common, usually occurring in the second trimester of pregnancy.  They tend to be worse when standing up or when you remain standing for long periods of time.  These are  the result of pressure of certain pelvic ligaments called "round ligaments".  Rest, Tylenol and heat seem to be the most effective relief.  As the womb and fetus grow, they rise out of the pelvis and the discomfort improves.  Please notify the office if your pain seems different than that described.  It may represent a more serious condition.  Common Medications Safe in Pregnancy  Acne:      Constipation:  Benzoyl Peroxide     Colace  Clindamycin      Dulcolax Suppository  Topica Erythromycin     Fibercon  Salicylic Acid      Metamucil         Miralax AVOID:        Senakot   Accutane    Cough:  Retin-A       Cough Drops  Tetracycline      Phenergan w/ Codeine if Rx  Minocycline      Robitussin (Plain & DM)  Antibiotics:     Crabs/Lice:  Ceclor       RID  Cephalosporins    AVOID:  E-Mycins      Kwell  Keflex  Macrobid/Macrodantin   Diarrhea:  Penicillin      Kao-Pectate  Zithromax      Imodium AD         PUSH FLUIDS AVOID:       Cipro     Fever:  Tetracycline      Tylenol (Regular or Extra  Minocycline       Strength)  Levaquin      Extra Strength-Do not          Exceed 8 tabs/24 hrs Caffeine:        '200mg'$ /day  (equiv. To 1 cup of coffee or  approx. 3 12 oz sodas)         Gas: Cold/Hayfever:       Gas-X  Benadryl      Mylicon  Claritin       Phazyme  **Claritin-D        Chlor-Trimeton    Headaches:  Dimetapp      ASA-Free Excedrin  Drixoral-Non-Drowsy     Cold Compress  Mucinex (Guaifenasin)     Tylenol (Regular or Extra  Sudafed/Sudafed-12 Hour     Strength)  **Sudafed PE Pseudoephedrine   Tylenol Cold & Sinus     Vicks Vapor Rub  Zyrtec  **AVOID if Problems With Blood Pressure         Heartburn: Avoid lying down for at least 1 hour after meals  Aciphex      Maalox     Rash:  Milk of Magnesia     Benadryl    Mylanta       1% Hydrocortisone Cream  Pepcid  Pepcid Complete   Sleep Aids:  Prevacid      Ambien   Prilosec       Benadryl  Rolaids       Chamomile Tea  Tums (Limit 4/day)     Unisom  Zantac       Tylenol PM         Warm milk-add vanilla or  Hemorrhoids:       Sugar for taste  Anusol/Anusol H.C.  (RX: Analapram 2.5%)  Sugar Substitutes:  Hydrocortisone OTC     Ok in moderation  Preparation H      Tucks        Vaseline lotion applied to tissue with wiping    Herpes:  Throat:  Acyclovir      Oragel  Famvir  Valtrex     Vaccines:         Flu Shot Leg Cramps:       *Gardasil  Benadryl      Hepatitis A         Hepatitis B Nasal Spray:       Pneumovax  Saline Nasal Spray     Polio Booster         Tetanus Nausea:       Tuberculosis test or PPD  Vitamin B6 25 mg TID   AVOID:    Dramamine      *Gardasil  Emetrol       Live Poliovirus  Ginger Root 250 mg QID    MMR (measles, mumps &  High Complex Carbs @ Bedtime    rebella)  Sea Bands-Accupressure    Varicella (Chickenpox)  Unisom 1/2 tab TID     *No known complications           If received before Pain:         Known pregnancy;   Darvocet       Resume series after  Lortab        Delivery  Percocet    Yeast:   Tramadol      Femstat  Tylenol  3      Gyne-lotrimin  Ultram       Monistat  Vicodin           MISC:         All Sunscreens           Hair Coloring/highlights          Insect Repellant's          (Including DEET)         Mystic Tans

## 2020-05-11 NOTE — Progress Notes (Signed)
I have seen, interviewed, and examined the patient in conjunction with the [employer] Nurse Practitioner student and affirm the diagnosis and management plan.   Gunnar Bulla, CNM [facility], [facility] 05/11/20 3:20 PM

## 2020-05-11 NOTE — Telephone Encounter (Signed)
error 

## 2020-05-11 NOTE — Progress Notes (Signed)
Dana Hood presents for NOB nurse intake visit. Pregnancy confirmation done at [facility], 04/13/2020, with JML, CNM.  G 1.  P 0.  LMP 03/22/2020.  EDD 12/29/2020.  GA [redacted]w[redacted]d. Pregnancy education material explained and given.  0 cats in the home.  NOB labs ordered. BMI less than 30. TSH/HbgA1c not ordered. Sickle cell not ordered due to race. HIV and drug screen explained and ordered. Genetic screening discussed. Genetic testing; Unsure. Pt to discuss genetic testing with provider. PNV encouraged. Pt to follow up with provider in  weeks for NOB physical. [facility] Financial Policy and FMLA form signed and completed by pt.

## 2020-05-12 ENCOUNTER — Encounter: Payer: Self-pay | Admitting: Certified Nurse Midwife

## 2020-05-12 DIAGNOSIS — Z2839 Other underimmunization status: Secondary | ICD-10-CM | POA: Insufficient documentation

## 2020-05-12 DIAGNOSIS — Z283 Underimmunization status: Secondary | ICD-10-CM | POA: Insufficient documentation

## 2020-05-12 LAB — DRUG PROFILE, UR, 9 DRUGS (LABCORP)
Amphetamines, Urine: NEGATIVE ng/mL
Barbiturate Quant, Ur: NEGATIVE ng/mL
Benzodiazepine Quant, Ur: NEGATIVE ng/mL
Cannabinoid Quant, Ur: NEGATIVE ng/mL
Cocaine (Metab.): NEGATIVE ng/mL
Methadone Screen, Urine: NEGATIVE ng/mL
Opiate Quant, Ur: NEGATIVE ng/mL
PCP Quant, Ur: NEGATIVE ng/mL
Propoxyphene: NEGATIVE ng/mL

## 2020-05-12 LAB — ABO AND RH: Rh Factor: POSITIVE

## 2020-05-12 LAB — RPR: RPR Ser Ql: NONREACTIVE

## 2020-05-12 LAB — HEPATITIS B SURFACE ANTIGEN: Hepatitis B Surface Ag: NEGATIVE

## 2020-05-12 LAB — URINALYSIS, ROUTINE W REFLEX MICROSCOPIC
Bilirubin, UA: NEGATIVE
Glucose, UA: NEGATIVE
Ketones, UA: NEGATIVE
Leukocytes,UA: NEGATIVE
Nitrite, UA: NEGATIVE
Protein,UA: NEGATIVE
RBC, UA: NEGATIVE
Specific Gravity, UA: 1.008 (ref 1.005–1.030)
Urobilinogen, Ur: 0.2 mg/dL (ref 0.2–1.0)
pH, UA: 5.5 (ref 5.0–7.5)

## 2020-05-12 LAB — HIV ANTIBODY (ROUTINE TESTING W REFLEX): HIV Screen 4th Generation wRfx: NONREACTIVE

## 2020-05-12 LAB — VARICELLA ZOSTER ANTIBODY, IGG: Varicella zoster IgG: 472 index (ref >165)

## 2020-05-12 LAB — NICOTINE SCREEN, URINE: Cotinine Ql Scrn, Ur: NEGATIVE ng/mL

## 2020-05-12 LAB — TOXOPLASMA ANTIBODIES- IGG AND  IGM
Toxoplasma Antibody- IgM: <3 AU/mL (ref 0.0–7.9)
Toxoplasma IgG Ratio: <3 IU/mL (ref 0.0–7.1)

## 2020-05-12 LAB — ANTIBODY SCREEN: Antibody Screen: NEGATIVE

## 2020-05-12 LAB — RUBELLA SCREEN: Rubella Antibodies, IGG: <0.9 index — ABNORMAL LOW (ref >0.99)

## 2020-05-13 LAB — GC/CHLAMYDIA PROBE AMP
Chlamydia trachomatis, NAA: NEGATIVE
Neisseria Gonorrhoeae by PCR: NEGATIVE

## 2020-05-13 LAB — CULTURE, OB URINE

## 2020-05-13 LAB — URINE CULTURE, OB REFLEX

## 2020-05-15 ENCOUNTER — Other Ambulatory Visit: Payer: Managed Care, Other (non HMO)

## 2020-05-18 ENCOUNTER — Other Ambulatory Visit: Payer: Managed Care, Other (non HMO)

## 2020-05-20 ENCOUNTER — Other Ambulatory Visit: Payer: Self-pay | Admitting: Internal Medicine

## 2020-05-23 ENCOUNTER — Ambulatory Visit (INDEPENDENT_AMBULATORY_CARE_PROVIDER_SITE_OTHER): Payer: Managed Care, Other (non HMO)

## 2020-05-23 ENCOUNTER — Other Ambulatory Visit: Payer: Self-pay

## 2020-05-23 DIAGNOSIS — Z3A09 9 weeks gestation of pregnancy: Secondary | ICD-10-CM

## 2020-05-23 DIAGNOSIS — Z789 Other specified health status: Secondary | ICD-10-CM

## 2020-06-12 ENCOUNTER — Other Ambulatory Visit: Payer: Self-pay

## 2020-06-12 ENCOUNTER — Encounter: Payer: Self-pay | Admitting: Certified Nurse Midwife

## 2020-06-12 ENCOUNTER — Ambulatory Visit (INDEPENDENT_AMBULATORY_CARE_PROVIDER_SITE_OTHER): Payer: Managed Care, Other (non HMO) | Admitting: Certified Nurse Midwife

## 2020-06-12 VITALS — BP 122/79 | HR 74 | Wt 146.7 lb

## 2020-06-12 DIAGNOSIS — Z283 Underimmunization status: Secondary | ICD-10-CM

## 2020-06-12 DIAGNOSIS — Z130 Encounter for screening for diseases of the blood and blood-forming organs and certain disorders involving the immune mechanism: Secondary | ICD-10-CM

## 2020-06-12 DIAGNOSIS — Z3401 Encounter for supervision of normal first pregnancy, first trimester: Secondary | ICD-10-CM

## 2020-06-12 DIAGNOSIS — O09899 Supervision of other high risk pregnancies, unspecified trimester: Secondary | ICD-10-CM

## 2020-06-12 DIAGNOSIS — O99891 Other specified diseases and conditions complicating pregnancy: Secondary | ICD-10-CM

## 2020-06-12 DIAGNOSIS — Z3A11 11 weeks gestation of pregnancy: Secondary | ICD-10-CM

## 2020-06-12 DIAGNOSIS — I10 Essential (primary) hypertension: Secondary | ICD-10-CM

## 2020-06-12 MED ORDER — ASPIRIN EC 81 MG PO TBEC: 81.0000 mg | DELAYED_RELEASE_TABLET | Freq: Every day | ORAL | 2 refills | Status: DC

## 2020-06-12 NOTE — Progress Notes (Addendum)
NEW OB HISTORY AND PHYSICAL  SUBJECTIVE:       Dana Hood is a 24 y.o. G44P0000 female, Patient's last menstrual period was 03/22/2020 (exact date)., Estimated Date of Delivery: 12/29/20, [redacted]w[redacted]d, presents today for establishment of Prenatal Care.  She has no unusual complaints and complains of nausea mostly in the mornings and improves with eating, backache, headache and cramping since the end of last week. Headache improves with tylenol.  Denies difficulty breathing or respiratory distress, chest pain, abdominal pain, excessive vaginal bleeding, dysuria, leg pain or swelling   Gynecologic History  Patient's last menstrual period was 03/22/2020 (exact date).   Contraception: Currently Pregnant   Last Pap: 04/13/20. Results were: Negative  Obstetric History  OB History  Gravida Para Term Preterm AB Living  1 0 0 0 0 0  SAB TAB Ectopic Multiple Live Births  0 0 0 0 0    # Outcome Date GA Lbr Len/2nd Weight Sex Delivery Anes PTL Lv  1 Current             Past Medical History:  Diagnosis Date  . Allergy to alpha-gal   . Anxiety   . Atrial fibrillation with tachycardic ventricular rate ([place])   . Hypertension     Past Surgical History:  Procedure Laterality Date  . NO PAST SURGERIES      Current Outpatient Medications on File Prior to Visit  Medication Sig Dispense Refill  . azelastine (ASTELIN) 0.1 % nasal spray Place 1 spray into both nostrils 2 (two) times daily. Use in each nostril as directed 30 mL 12  . cetirizine (ZYRTEC) 10 MG tablet Take 1 tablet (10 mg total) by mouth daily. 90 tablet 1  . EPINEPHrine (ADRENACLICK) 0.3 UY/4.0 mL IJ SOAJ injection Inject 0.3 mLs (0.3 mg total) into the muscle as needed for anaphylaxis. 1 each 1  . famotidine (PEPCID) 20 MG tablet Take 20 mg by mouth 2 (two) times daily.    Marland Kitchen labetalol (NORMODYNE) 100 MG tablet TAKE 1 TABLET BY MOUTH TWICE A DAY 180 tablet 1  . Prenatal Vit-Fe Fumarate-FA (PRENATAL VITAMINS PO) Take 1 tablet  by mouth daily.    . sertraline (ZOLOFT) 100 MG tablet TAKE 1 TABLET BY MOUTH EVERY DAY 90 tablet 4  . hydrochlorothiazide (HYDRODIURIL) 25 MG tablet TAKE 1 TABLET BY MOUTH EVERY DAY (Patient not taking: Reported on 06/12/2020) 90 tablet 1  . montelukast (SINGULAIR) 10 MG tablet TAKE 1 TABLET BY MOUTH EVERYDAY AT BEDTIME (Patient not taking: Reported on 06/12/2020) 30 tablet 0   No current facility-administered medications on file prior to visit.    Allergies  Allergen Reactions  . Alpha-Gal     No red meat    Social History   Socioeconomic History  . Marital status: Married    Spouse name: Not on file  . Number of children: Not on file  . Years of education: Not on file  . Highest education level: Not on file  Occupational History  . Not on file  Tobacco Use  . Smoking status: Former Smoker    Packs/day: 0.25    Years: 5.00    Pack years: 1.25    Types: Cigarettes  . Smokeless tobacco: Never Used  . Tobacco comment: 3-4 cig/day  Vaping Use  . Vaping Use: Never used  Substance and Sexual Activity  . Alcohol use: Not Currently    Comment: social  . Drug use: Never  . Sexual activity: Yes    Partners: Male  Birth control/protection: None  Other Topics Concern  . Not on file  Social History Narrative  . Not on file   Social Determinants of Health   Financial Resource Strain:   . Difficulty of Paying Living Expenses:   Food Insecurity:   . Worried About Programme researcher, broadcasting/film/video in the Last Year:   . Barista in the Last Year:   Transportation Needs:   . Freight forwarder (Medical):   Marland Kitchen Lack of Transportation (Non-Medical):   Physical Activity:   . Days of Exercise per Week:   . Minutes of Exercise per Session:   Stress:   . Feeling of Stress :   Social Connections:   . Frequency of Communication with Friends and Family:   . Frequency of Social Gatherings with Friends and Family:   . Attends Religious Services:   . Active Member of Clubs or  Organizations:   . Attends Banker Meetings:   Marland Kitchen Marital Status:   Intimate Partner Violence:   . Fear of Current or Ex-Partner:   . Emotionally Abused:   Marland Kitchen Physically Abused:   . Sexually Abused:     Family History  Problem Relation Age of Onset  . Hyperlipidemia Mother   . Hypertension Mother   . Migraines Mother   . Hypertension Sister   . Hyperlipidemia Sister   . Heart failure Maternal Grandfather   . Rheum arthritis Maternal Uncle   . Breast cancer Neg Hx   . Ovarian cancer Neg Hx   . Colon cancer Neg Hx     The following portions of the patient's history were reviewed and updated as appropriate: allergies, current medications, past OB history, past medical history, past surgical history, past family history, past social history, and problem list.  Review of Systems:  ROS: Negative except as noted above. Information obtained from patient.   OBJECTIVE:  BP 122/79   Pulse 74   Wt 146 lb 11.2 oz (66.5 kg)   LMP 03/22/2020 (Exact Date)   BMI 27.72 kg/m   Initial Physical Exam (New OB)  GENERAL APPEARANCE: alert, well appearing, in no apparent distress, oriented to person, place and time  HEAD: normocephalic, atraumatic  MOUTH: mucous membranes moist, pharynx normal without lesions and dental hygiene good  THYROID: no thyromegaly or masses present  BREASTS: patient declined exam  LUNGS: clear to auscultation, no wheezes, rales or rhonchi, symmetric air entry  HEART: regular rate and rhythm, no murmurs  ABDOMEN: soft, nontender, nondistended, no abnormal masses, no epigastric pain and FHT present  EXTREMITIES: no redness or tenderness in the calves or thighs, no edema  SKIN: normal coloration and turgor, no rashes  LYMPH NODES: no adenopathy palpable  NEUROLOGIC: alert, oriented, normal speech, no focal findings or movement disorder noted  PELVIC EXAM Deferred   ASSESSMENT:  Encounter for supervision of normal first pregnancy  Eleven  (11) weeks of gestation  [place], non-immune antepartum  Screening for deficiency, anemia  Chronic hypertension in pregnancy on labetalol  Depression and anxiety on Zoloft  PLAN:  Prenatal care reviewed with patient.  Rx Aspirin, see orders.   Labs today: See orders.  Panorama collected today, Patient would like results in envelope.  Encouraged to continue with routine preventative screenings.  Reviewed red flag symptoms and when to call the office.  RTC x 4 weeks for ROB or sooner if needed.  Glorious Peach RN [facility] 06/12/20 4:52 PM

## 2020-06-12 NOTE — Progress Notes (Signed)
I have seen, interviewed, and examined the patient in conjunction with the [employer] and affirm the diagnosis and management plan.   Gunnar Bulla, CNM [facility], [facility] 06/12/20 5:36 PM

## 2020-06-12 NOTE — Patient Instructions (Signed)

## 2020-06-13 LAB — CBC WITH DIFFERENTIAL/PLATELET
Basophils Absolute: 0.1 10*3/uL (ref 0.0–0.2)
Basos: 1 %
EOS (ABSOLUTE): 0.2 10*3/uL (ref 0.0–0.4)
Eos: 2 %
Hematocrit: 35.5 % (ref 34.0–46.6)
Hemoglobin: 12.3 g/dL (ref 11.1–15.9)
Immature Grans (Abs): 0 10*3/uL (ref 0.0–0.1)
Immature Granulocytes: 0 %
Lymphocytes Absolute: 1.6 10*3/uL (ref 0.7–3.1)
Lymphs: 21 %
MCH: 30.6 pg (ref 26.6–33.0)
MCHC: 34.6 g/dL (ref 31.5–35.7)
MCV: 88 fL (ref 79–97)
Monocytes Absolute: 0.9 10*3/uL (ref 0.1–0.9)
Monocytes: 11 %
Neutrophils Absolute: 5 10*3/uL (ref 1.4–7.0)
Neutrophils: 65 %
Platelets: 315 10*3/uL (ref 150–450)
RBC: 4.02 x10E6/uL (ref 3.77–5.28)
RDW: 12.5 % (ref 11.7–15.4)
WBC: 7.8 10*3/uL (ref 3.4–10.8)

## 2020-06-18 ENCOUNTER — Encounter: Payer: Self-pay | Admitting: Certified Nurse Midwife

## 2020-06-22 ENCOUNTER — Telehealth: Payer: Self-pay

## 2020-06-22 ENCOUNTER — Telehealth: Payer: Self-pay | Admitting: Certified Nurse Midwife

## 2020-06-22 NOTE — Telephone Encounter (Signed)
Patient called in stating that she noticed her testing came back for the gender of her baby, patient requested that she does not be informed of the gender that she would like the results to go to [employer] DOB [birth year]. Ms. Johna Sheriff is a close family friend and can be reached at [phone].

## 2020-06-22 NOTE — Telephone Encounter (Signed)
As per telephone call received from patient- a call was placed to [facility]. DOB as identifier. She was told verbally the gender of patients baby per patient request.

## 2020-06-22 NOTE — Telephone Encounter (Signed)
mychart message sent to patient

## 2020-06-27 ENCOUNTER — Telehealth: Payer: Self-pay

## 2020-06-27 ENCOUNTER — Telehealth: Payer: Self-pay | Admitting: Certified Nurse Midwife

## 2020-06-27 NOTE — Telephone Encounter (Signed)
mychart message sent to patient

## 2020-06-27 NOTE — Telephone Encounter (Signed)
The pt stated that she is requesting the rest of the results from her genetics testing. The pt said that she is okay with talking thur my chart . Please advise

## 2020-06-27 NOTE — Telephone Encounter (Signed)
Please provide patient with results. Thanks, JML

## 2020-07-10 ENCOUNTER — Encounter: Payer: Managed Care, Other (non HMO) | Admitting: Certified Nurse Midwife

## 2020-07-12 ENCOUNTER — Encounter: Payer: Self-pay | Admitting: Certified Nurse Midwife

## 2020-07-12 ENCOUNTER — Ambulatory Visit (INDEPENDENT_AMBULATORY_CARE_PROVIDER_SITE_OTHER): Payer: Managed Care, Other (non HMO) | Admitting: Certified Nurse Midwife

## 2020-07-12 ENCOUNTER — Other Ambulatory Visit: Payer: Self-pay

## 2020-07-12 VITALS — BP 107/76 | HR 79 | Wt 144.1 lb

## 2020-07-12 DIAGNOSIS — Z3A15 15 weeks gestation of pregnancy: Secondary | ICD-10-CM

## 2020-07-12 LAB — POCT URINALYSIS DIPSTICK OB
Bilirubin, UA: NEGATIVE
Blood, UA: NEGATIVE
Glucose, UA: NEGATIVE
Ketones, UA: NEGATIVE
Leukocytes, UA: NEGATIVE
Nitrite, UA: NEGATIVE
POC,PROTEIN,UA: NEGATIVE
Spec Grav, UA: 1.02 (ref 1.010–1.025)
Urobilinogen, UA: 0.2 E.U./dL
pH, UA: 5 (ref 5.0–8.0)

## 2020-07-12 NOTE — Progress Notes (Signed)
ROB doing well. Discussed u/s for anatomy next visit. She state she had episode of nausea last night . That over all the nausea had decreased once in her 2nd trimester. Reviewed N&V in pregnancy. Pt encouraged to reach out to [place] if she needs medication for N&V. She verbalizes and agrees to plan . Follow up 4 wks for u/s and ROB with Marcelino Duster.   [name], CNM

## 2020-07-12 NOTE — Patient Instructions (Signed)

## 2020-08-10 ENCOUNTER — Ambulatory Visit (INDEPENDENT_AMBULATORY_CARE_PROVIDER_SITE_OTHER): Payer: Managed Care, Other (non HMO) | Admitting: Certified Nurse Midwife

## 2020-08-10 ENCOUNTER — Ambulatory Visit (INDEPENDENT_AMBULATORY_CARE_PROVIDER_SITE_OTHER): Payer: Managed Care, Other (non HMO)

## 2020-08-10 ENCOUNTER — Other Ambulatory Visit: Payer: Self-pay

## 2020-08-10 ENCOUNTER — Encounter: Payer: Self-pay | Admitting: Certified Nurse Midwife

## 2020-08-10 VITALS — BP 105/73 | HR 90 | Wt 145.6 lb

## 2020-08-10 DIAGNOSIS — Z3A15 15 weeks gestation of pregnancy: Secondary | ICD-10-CM

## 2020-08-10 DIAGNOSIS — Z3402 Encounter for supervision of normal first pregnancy, second trimester: Secondary | ICD-10-CM

## 2020-08-10 DIAGNOSIS — Z3A19 19 weeks gestation of pregnancy: Secondary | ICD-10-CM

## 2020-08-10 LAB — POCT URINALYSIS DIPSTICK OB
Bilirubin, UA: NEGATIVE
Blood, UA: NEGATIVE
Glucose, UA: NEGATIVE
Ketones, UA: NEGATIVE
Leukocytes, UA: NEGATIVE
Nitrite, UA: NEGATIVE
POC,PROTEIN,UA: NEGATIVE
Spec Grav, UA: 1.01 (ref 1.010–1.025)
Urobilinogen, UA: 0.2 E.U./dL
pH, UA: 6.5 (ref 5.0–8.0)

## 2020-08-10 MED ORDER — CEFDINIR 300 MG PO CAPS: 300.0000 mg | ORAL_CAPSULE | Freq: Two times a day (BID) | ORAL | 0 refills | Status: AC

## 2020-08-10 NOTE — Patient Instructions (Addendum)
Back Pain in Pregnancy Back pain during pregnancy is common. Back pain may be caused by several factors that are related to changes during your pregnancy. Follow these instructions at home: Managing pain, stiffness, and swelling      If directed, for sudden (acute) back pain, put ice on the painful area. ? Put ice in a plastic bag. ? Place a towel between your skin and the bag. ? Leave the ice on for 20 minutes, 2-3 times per day.  If directed, apply heat to the affected area before you exercise. Use the heat source that your health care provider recommends, such as a moist heat pack or a heating pad. ? Place a towel between your skin and the heat source. ? Leave the heat on for 20-30 minutes. ? Remove the heat if your skin turns bright red. This is especially important if you are unable to feel pain, heat, or cold. You may have a greater risk of getting burned.  If directed, massage the affected area. Activity  Exercise as told by your health care provider. Gentle exercise is the best way to prevent or manage back pain.  Listen to your body when lifting. If lifting hurts, ask for help or bend your knees. This uses your leg muscles instead of your back muscles.  Squat down when picking up something from the floor. Do not bend over.  Only use bed rest for short periods as told by your health care provider. Bed rest should only be used for the most severe episodes of back pain. Standing, sitting, and lying down  Do not stand in one place for long periods of time.  Use good posture when sitting. Make sure your head rests over your shoulders and is not hanging forward. Use a pillow on your lower back if necessary.  Try sleeping on your side, preferably the left side, with a pregnancy support pillow or 1-2 regular pillows between your legs. ? If you have back pain after a night's rest, your bed may be too soft. ? A firm mattress may provide more support for your back during  pregnancy. General instructions  Do not wear high heels.  Eat a healthy diet. Try to gain weight within your health care provider's recommendations.  Use a maternity girdle, elastic sling, or back brace as told by your health care provider.  Take over-the-counter and prescription medicines only as told by your health care provider.  Work with a physical therapist or massage therapist to find ways to manage back pain. Acupuncture or massage therapy may be helpful.  Keep all follow-up visits as told by your health care provider. This is important. Contact a health care provider if:  Your back pain interferes with your daily activities.  You have increasing pain in other parts of your body. Get help right away if:  You develop numbness, tingling, weakness, or problems with the use of your arms or legs.  You develop severe back pain that is not controlled with medicine.  You have a change in bowel or bladder control.  You develop shortness of breath, dizziness, or you faint.  You develop nausea, vomiting, or sweating.  You have back pain that is a rhythmic, cramping pain similar to labor pains. Labor pain is usually 1-2 minutes apart, lasts for about 1 minute, and involves a bearing down feeling or pressure in your pelvis.  You have back pain and your water breaks or you have vaginal bleeding.  You have back pain or numbness  that travels down your leg.  Your back pain developed after you fell.  You develop pain on one side of your back.  You see blood in your urine.  You develop skin blisters in the area of your back pain. Summary  Back pain may be caused by several factors that are related to changes during your pregnancy.  Follow instructions as told by your health care provider for managing pain, stiffness, and swelling.  Exercise as told by your health care provider. Gentle exercise is the best way to prevent or manage back pain.  Take over-the-counter and  prescription medicines only as told by your health care provider.  Keep all follow-up visits as told by your health care provider. This is important. This information is not intended to replace advice given to you by your health care provider. Make sure you discuss any questions you have with your health care provider. Document Revised: 03/30/2019 Document Reviewed: 05/27/2018 Elsevier Patient Education  [place].   Round Ligament Pain  The round ligament is a cord of muscle and tissue that helps support the uterus. It can become a source of pain during pregnancy if it becomes stretched or twisted as the baby grows. The pain usually begins in the second trimester (13-28 weeks) of pregnancy, and it can come and go until the baby is delivered. It is not a serious problem, and it does not cause harm to the baby. Round ligament pain is usually a short, sharp, and pinching pain, but it can also be a dull, lingering, and aching pain. The pain is felt in the lower side of the abdomen or in the groin. It usually starts deep in the groin and moves up to the outside of the hip area. The pain may occur when you:  Suddenly change position, such as quickly going from a sitting to standing position.  Roll over in bed.  Cough or sneeze.  Do physical activity. Follow these instructions at home:   Watch your condition for any changes.  When the pain starts, relax. Then try any of these methods to help with the pain: ? Sitting down. ? Flexing your knees up to your abdomen. ? Lying on your side with one pillow under your abdomen and another pillow between your legs. ? Sitting in a warm bath for 15-20 minutes or until the pain goes away.  Take over-the-counter and prescription medicines only as told by your health care provider.  Move slowly when you sit down or stand up.  Avoid long walks if they cause pain.  Stop or reduce your physical activities if they cause pain.  Keep all  follow-up visits as told by your health care provider. This is important. Contact a health care provider if:  Your pain does not go away with treatment.  You feel pain in your back that you did not have before.  Your medicine is not helping. Get help right away if:  You have a fever or chills.  You develop uterine contractions.  You have vaginal bleeding.  You have nausea or vomiting.  You have diarrhea.  You have pain when you urinate. Summary  Round ligament pain is felt in the lower abdomen or groin. It is usually a short, sharp, and pinching pain. It can also be a dull, lingering, and aching pain.  This pain usually begins in the second trimester (13-28 weeks). It occurs because the uterus is stretching with the growing baby, and it is not harmful to the  baby.  Dennis Bast may notice the pain when you suddenly change position, when you cough or sneeze, or during physical activity.  Relaxing, flexing your knees to your abdomen, lying on one side, or taking a warm bath may help to get rid of the pain.  Get help from your health care provider if the pain does not go away or if you have vaginal bleeding, nausea, vomiting, diarrhea, or painful urination. This information is not intended to replace advice given to you by your health care provider. Make sure you discuss any questions you have with your health care provider. Document Revised: 05/27/2018 Document Reviewed: 05/27/2018 Elsevier Patient Education  [place].  Common Medications Safe in Pregnancy  Acne:      Constipation:  Benzoyl Peroxide     Colace  Clindamycin      Dulcolax Suppository  Topica Erythromycin     Fibercon  Salicylic Acid      Metamucil         Miralax AVOID:        Senakot   Accutane    Cough:  Retin-A       Cough Drops  Tetracycline      Phenergan w/ Codeine if Rx  Minocycline      Robitussin (Plain &  DM)  Antibiotics:     Crabs/Lice:  Ceclor       RID  Cephalosporins    AVOID:  E-Mycins      Kwell  Keflex  Macrobid/Macrodantin   Diarrhea:  Penicillin      Kao-Pectate  Zithromax      Imodium AD         PUSH FLUIDS AVOID:       Cipro     Fever:  Tetracycline      Tylenol (Regular or Extra  Minocycline       Strength)  Levaquin      Extra Strength-Do not          Exceed 8 tabs/24 hrs Caffeine:        <277m/day (equiv. To 1 cup of coffee or  approx. 3 12 oz sodas)         Gas: Cold/Hayfever:       Gas-X  Benadryl      Mylicon  Claritin       Phazyme  **Claritin-D        Chlor-Trimeton    Headaches:  Dimetapp      ASA-Free Excedrin  Drixoral-Non-Drowsy     Cold Compress  Mucinex (Guaifenasin)     Tylenol (Regular or Extra  Sudafed/Sudafed-12 Hour     Strength)  **Sudafed PE Pseudoephedrine   Tylenol Cold & Sinus     Vicks Vapor Rub  Zyrtec  **AVOID if Problems With Blood Pressure         Heartburn: Avoid lying down for at least 1 hour after meals  Aciphex      Maalox     Rash:  Milk of Magnesia     Benadryl    Mylanta       1% Hydrocortisone Cream  Pepcid  Pepcid Complete   Sleep Aids:  Prevacid      Ambien   Prilosec       Benadryl  Rolaids       Chamomile Tea  Tums (Limit 4/day)     Unisom         Tylenol PM         Warm milk-add vanilla or  Hemorrhoids:  Sugar for taste  Anusol/Anusol H.C.  (RX: Analapram 2.5%)  Sugar Substitutes:  Hydrocortisone OTC     Ok in moderation  Preparation H      Tucks        Vaseline lotion applied to tissue with wiping    Herpes:     Throat:  Acyclovir      Oragel  Famvir  Valtrex     Vaccines:         Flu Shot Leg Cramps:       *Gardasil  Benadryl      Hepatitis A         Hepatitis B Nasal Spray:       Pneumovax  Saline Nasal Spray     Polio Booster         Tetanus Nausea:       Tuberculosis test or PPD  Vitamin B6 25 mg TID   AVOID:    Dramamine      *Gardasil  Emetrol       Live Poliovirus  Ginger  Root 250 mg QID    MMR (measles, mumps &  High Complex Carbs @ Bedtime    rebella)  Sea Bands-Accupressure    Varicella (Chickenpox)  Unisom 1/2 tab TID     *No known complications           If received before Pain:         Known pregnancy;   Darvocet       Resume series after  Lortab        Delivery  Percocet    Yeast:   Tramadol      Femstat  Tylenol 3      Gyne-lotrimin  Ultram       Monistat  Vicodin           MISC:         All Sunscreens           Hair Coloring/highlights          Insect Repellant's          (Including DEET)         Mystic Tans   Healthy Weight Gain During Pregnancy, Adult A certain amount of weight gain during pregnancy is normal and healthy. How much weight you should gain depends on your overall health and a measurement called BMI (body mass index). BMI is an estimate of your body fat based on your height and weight. You can use an Freight forwarder to figure out your BMI, or you can ask your health care provider to calculate it for you at your next visit. Your recommended pregnancy weight gain is based on your pre-pregnancy BMI. General guidelines for a healthy total weight gain during pregnancy are listed below. If your BMI at or before the start of your pregnancy is:  Less than 18.5 (underweight), you should gain 28-40 lb (13-18 kg).  18.5-24.9 (normal weight), you should gain 25-35 lb (11-16 kg).  25-29.9 (overweight), you should gain 15-25 lb (7-11 kg).  30 or higher (obese), you should gain 11-20 lb (5-9 kg). These ranges vary depending on your individual health. If you are carrying more than one baby (multiples), it may be safe to gain more weight than these recommendations. If you gain less weight than recommended, that may be safe as long as your baby is growing and developing normally. How can unhealthy weight gain affect me and my baby? Gaining too much weight during pregnancy can lead to pregnancy complications,  such as:  A temporary form of  diabetes that develops during pregnancy (gestational diabetes).  High blood pressure during pregnancy and protein in your urine (preeclampsia).  High blood pressure during pregnancy without protein in your urine (gestational hypertension).  Your baby having a high weight at birth, which may: ? Raise your risk of having a more difficult delivery or a surgical delivery (cesarean delivery, or C-section). ? Raise your child's risk of developing obesity during childhood. Not gaining enough weight can be life-threatening for your baby, and it may raise your baby's chances of:  Being born early (preterm).  Growing more slowly than normal during pregnancy (growth restriction).  Having a low weight at birth. What actions can I take to gain a healthy amount of weight during pregnancy? General instructions  Keep track of your weight gain during pregnancy.  Take over-the-counter and prescription medicines only as told by your health care provider. Take all prenatal supplements as directed.  Keep all health care visits during pregnancy (prenatal visits). These visits are a good time to discuss your weight gain. Your health care provider will weigh you at each visit to make sure you are gaining a healthy amount of weight. Nutrition   Eat a balanced, nutrient-rich diet. Eat plenty of: ? Fruits and vegetables, such as berries and broccoli. ? Whole grains, such as millet, barley, whole-wheat breads and cereals, and oatmeal. ? Low-fat dairy products or non-dairy products such as almond milk or rice milk. ? Protein foods, such as lean meat, chicken, eggs, and legumes (such as peas, beans, soybeans, and lentils).  Avoid foods that are fried or have a lot of fat, salt (sodium), or sugar.  Drink enough fluid to keep your urine pale yellow.  Choose healthy snack and drink options when you are at work or on the go: ? Drink water. Avoid soda, sports drinks, and juices that have added sugar. ? Avoid  drinks with caffeine, such as coffee and energy drinks. ? Eat snacks that are high in protein, such as nuts, protein bars, and low-fat yogurt. ? Carry convenient snacks in your purse that do not need refrigeration, such as a pack of trail mix, an apple, or a granola bar.  If you need help improving your diet, work with a health care provider or a diet and nutrition specialist (dietitian). Activity   Exercise regularly, as told by your health care provider. ? If you were active before becoming pregnant, you may be able to continue your regular fitness activities. ? If you were not active before pregnancy, you may gradually build up to exercising for 30 or more minutes on most days of the week. This may include walking, swimming, or yoga.  Ask your health care provider what activities are safe for you. Talk with your health care provider about whether you may need to be excused from certain school or work activities. Where to find more information Learn more about managing your weight gain during pregnancy from:  American Pregnancy Association: www.americanpregnancy.org  U.[place]. Department of Agriculture pregnancy weight gain calculator: FormerBoss.no Summary  Too much weight gain during pregnancy can lead to complications for you and your baby.  Find out your pre-pregnancy BMI to determine how much weight gain is healthy for you.  Eat nutritious foods and stay active.  Keep all of your prenatal visits as told by your health care provider. This information is not intended to replace advice given to you by your health care provider. Make sure you discuss any  questions you have with your health care provider. Document Revised: 09/01/2019 Document Reviewed: 08/29/2017 Elsevier Patient Education  [place].

## 2020-08-10 NOTE — Progress Notes (Signed)
ROB-Doing well, no questions or concerns. Anatomy scan today completed and normal, see below. Anticipatory guidance regarding course of prenatal care. Reviewed red flag symptoms and when to call. RTC x 4 weeks for ROB or sooner if needed.   ULTRASOUND REPORT  Location: [facility] Date of Service: 08/10/2020   Indications:Anatomy Ultrasound Findings:  Singleton intrauterine pregnancy is visualized with FHR at 153 BPM. Biometrics give an (U/S) Gestational age of [redacted]w[redacted]d and an (U/S) EDD of 12/28/2020; this correlates with the clinically established Estimated Date of Delivery: 12/29/20  Fetal presentation is Variable.  EFW: 322 g ( 11 oz). Fetal Percentile  Placenta: posterior. Grade: 1 AFI: subjectively normal.  Anatomic survey is complete and normal; Gender - female.    Right Ovary is normal in appearance. Left Ovary is normal appearance. Survey of the adnexa demonstrates no adnexal masses. There is no free peritoneal fluid in the cul de sac.  Impression: 1. [redacted]w[redacted]d Viable Singleton Intrauterine pregnancy by U/S. 2. (U/S) EDD is consistent with Clinically established Estimated Date of Delivery: 12/29/20 . 3. Normal Anatomy Scan  Recommendations: 1.Clinical correlation with the patient's History and Physical Exam.

## 2020-08-31 ENCOUNTER — Other Ambulatory Visit: Payer: Self-pay | Admitting: Certified Nurse Midwife

## 2020-09-04 ENCOUNTER — Ambulatory Visit (INDEPENDENT_AMBULATORY_CARE_PROVIDER_SITE_OTHER): Payer: Managed Care, Other (non HMO) | Admitting: Certified Nurse Midwife

## 2020-09-04 ENCOUNTER — Other Ambulatory Visit: Payer: Self-pay

## 2020-09-04 ENCOUNTER — Encounter: Payer: Self-pay | Admitting: Certified Nurse Midwife

## 2020-09-04 VITALS — BP 124/64 | HR 80 | Wt 149.2 lb

## 2020-09-04 DIAGNOSIS — Z3A23 23 weeks gestation of pregnancy: Secondary | ICD-10-CM

## 2020-09-04 MED ORDER — CIMETIDINE HCL 300 MG/5ML PO SOLN: 300.0000 mg | Freq: Four times a day (QID) | ORAL | 0 refills | Status: DC

## 2020-09-04 NOTE — Progress Notes (Signed)
ROB doing well. Feels good fetal movement. C/O of indigestions has. Take meds of med list with little improvement, pt ordered tagamet in place of Pepcid. Discussed 28 wk labs. Pt verbalize understanding. Follow up 4.5 wks for ROB with Marcelino Duster.   [name], CNM

## 2020-09-04 NOTE — Patient Instructions (Signed)

## 2020-09-06 ENCOUNTER — Other Ambulatory Visit: Payer: Self-pay

## 2020-09-06 ENCOUNTER — Telehealth: Payer: Self-pay | Admitting: Certified Nurse Midwife

## 2020-09-06 ENCOUNTER — Telehealth: Payer: Self-pay

## 2020-09-06 MED ORDER — ONDANSETRON 4 MG PO TBDP
4.0000 mg | ORAL_TABLET | Freq: Four times a day (QID) | ORAL | 0 refills | Status: DC | PRN
Start: 2020-09-06 — End: 2020-12-09

## 2020-09-06 NOTE — Telephone Encounter (Signed)
mychart message sent to patient

## 2020-09-06 NOTE — Telephone Encounter (Signed)
Pt called in stated that she is having some nausea, the pt said she had some Monday but its gotten worse. The pt is requesting something to take when she feels sick over to the pharmacy. I told the pt I will send a message to the nurse, the pt uses [facility] [address]. Please advise

## 2020-09-27 ENCOUNTER — Other Ambulatory Visit: Payer: Self-pay

## 2020-09-27 ENCOUNTER — Telehealth: Payer: Self-pay

## 2020-09-27 NOTE — Telephone Encounter (Signed)
mychart message sent to patient

## 2020-10-06 ENCOUNTER — Telehealth: Payer: Self-pay

## 2020-10-06 NOTE — Telephone Encounter (Signed)
Returned patients call- she states she had some brownish discharge last night and some this AM. Was on the toilet tissue when she wiped. Mild period cramping. Stomach tightening. Last intercourse 1 week ago.Good fetal movement. Advised to stay hydrated and monitor symptoms. If bleeding becomes worse, bright red accompanied by more severe cramping to contact office or go to the ED. Keep scheduled appointment 10/09/20. Patient expressed understanding.

## 2020-10-06 NOTE — Telephone Encounter (Signed)
Pt called in and stated that she is spotting that pt said that it started last night and she still is this morning. Its brown and light pink. The pt said that she has mild cramping that kind of  feels like her period cramp. The pt is requesting a call back from the nurse. The pt said that she can still feel him moving. Please advise

## 2020-10-09 ENCOUNTER — Other Ambulatory Visit: Payer: Managed Care, Other (non HMO)

## 2020-10-09 ENCOUNTER — Encounter: Payer: Self-pay | Admitting: Certified Nurse Midwife

## 2020-10-09 ENCOUNTER — Ambulatory Visit (INDEPENDENT_AMBULATORY_CARE_PROVIDER_SITE_OTHER): Payer: Managed Care, Other (non HMO) | Admitting: Certified Nurse Midwife

## 2020-10-09 ENCOUNTER — Ambulatory Visit (INDEPENDENT_AMBULATORY_CARE_PROVIDER_SITE_OTHER): Payer: Managed Care, Other (non HMO)

## 2020-10-09 ENCOUNTER — Other Ambulatory Visit: Payer: Self-pay

## 2020-10-09 VITALS — BP 109/73 | HR 99 | Wt 150.6 lb

## 2020-10-09 DIAGNOSIS — Z3403 Encounter for supervision of normal first pregnancy, third trimester: Secondary | ICD-10-CM

## 2020-10-09 DIAGNOSIS — Z113 Encounter for screening for infections with a predominantly sexual mode of transmission: Secondary | ICD-10-CM

## 2020-10-09 DIAGNOSIS — I10 Essential (primary) hypertension: Secondary | ICD-10-CM

## 2020-10-09 DIAGNOSIS — Z3A28 28 weeks gestation of pregnancy: Secondary | ICD-10-CM

## 2020-10-09 DIAGNOSIS — Z23 Encounter for immunization: Secondary | ICD-10-CM | POA: Diagnosis not present

## 2020-10-09 DIAGNOSIS — Z130 Encounter for screening for diseases of the blood and blood-forming organs and certain disorders involving the immune mechanism: Secondary | ICD-10-CM

## 2020-10-09 DIAGNOSIS — Z131 Encounter for screening for diabetes mellitus: Secondary | ICD-10-CM

## 2020-10-09 LAB — POCT URINALYSIS DIPSTICK OB
Bilirubin, UA: NEGATIVE
Blood, UA: NEGATIVE
Glucose, UA: NEGATIVE
Ketones, UA: NEGATIVE
Leukocytes, UA: NEGATIVE
Nitrite, UA: NEGATIVE
POC,PROTEIN,UA: NEGATIVE
Spec Grav, UA: 1.01 (ref 1.010–1.025)
Urobilinogen, UA: 0.2 E.U./dL
pH, UA: 8 (ref 5.0–8.0)

## 2020-10-09 NOTE — Progress Notes (Addendum)
ROB-Doing well. 28 week and baseline PIH labs today, see orders. TDaP given, see chart. Blood transfusion consent signed. Breastfeeding education completed, see checklist. Anticipatory guidance regarding antenatal testing and course of prenatal care. Growth ultrasound and AFI scheduled for later today. Reviewed red flag symptoms and when to call. RTC x 2 weeks for ROB or sooner if needed.   ULTRASOUND REPORT  Location: [facility] Date of Service: 10/09/2020   Indications:growth/afi Findings:  Mason Jim intrauterine pregnancy is visualized with FHR at 136 BPM. Biometrics give an (U/S) Gestational age of [redacted]w[redacted]d and an (U/S) EDD of 12/27/2020; this correlates with the clinically established Estimated Date of Delivery: 12/29/20.  Fetal presentation is Cephalic.  Placenta: posterior. Grade: 1 AFI: 10.7 cm  Growth percentile is 39. EFW: 1191 g ( 2 lbs 10 oz)   Impression: 1. [redacted]w[redacted]d Viable Singleton Intrauterine pregnancy previously established criteria. 2. Growth is 39 %ile.  AFI is 10.7 cm.   Recommendations: 1.Clinical correlation with the patient's History and Physical Exam.

## 2020-10-09 NOTE — Patient Instructions (Addendum)
Nonstress Test A nonstress test is a procedure that is done during pregnancy in order to check the baby's heartbeat. The procedure can help show if the baby (fetus) is healthy. It is commonly done when:  The baby is past his or her due date.  The pregnancy is high risk.  The baby is moving less than normal.  The mother has lost a pregnancy in the past.  The health care provider suspects a problem with the baby's growth.  There is too much or too little amniotic fluid. The procedure is often done in the third trimester of pregnancy to find out if an early delivery is needed and whether such a delivery is safe. During a nonstress test, the baby's heartbeat is monitored when the baby is resting and when the baby is moving. If the baby is healthy, the heart rate will increase when he or she moves or kicks and will return to normal when he or she rests. Tell a health care provider about:  Any allergies you have.  Any medical conditions you have.  All medicines you are taking, including vitamins, herbs, eye drops, creams, and over-the-counter medicines. What are the risks? There are no risks to you or your baby from a nonstress test. This procedure should not be painful or uncomfortable. What happens before the procedure?  Eat a meal right before the test or as directed by your health care provider. Food may help encourage the baby to move.  Use the restroom right before the test. What happens during the procedure?  Two monitors will be placed on your abdomen. One will record the baby's heart rate and the other will record the contractions of your uterus.  You may be asked to lie down on your side or to sit upright.  You may be given a button to press when you feel your baby move.  Your health care provider will listen to your baby's heartbeat and recorded it. He or she may also watch your baby's heartbeat on a screen.  If the baby seems to be sleeping, you may be asked to drink  some juice or soda, eat a snack, or change positions. The procedure may vary among health care providers and hospitals. What happens after the procedure?  Your health care provider will discuss the test results with you and make recommendations for the future. Depending on the results, your health care provider may order additional tests or another course of action.  If your health care provider gave you any diet or activity instructions, make sure to follow them.  Keep all follow-up visits as told by your health care provider. This is important. Summary  A nonstress test is a procedure that is done during pregnancy in order to check the baby's heartbeat. The procedure can help show if the baby is healthy.  The procedure is often done in the third trimester of pregnancy to find out if an early delivery is needed and whether such a delivery is safe.  During a nonstress test, the baby's heartbeat is monitored when the baby is resting and when the baby is moving. If the baby is healthy, the heart rate will increase when he or she moves or kicks and will return to normal when he or she rests.  Your health care provider will discuss the test results with you and make recommendations for the future. This information is not intended to replace advice given to you by your health care provider. Make sure you discuss any  questions you have with your health care provider. Document Revised: 03/20/2017 Document Reviewed: 03/20/2017 Elsevier Patient Education  2020 [employer].   [facility]  [place], [place], [place] [address]  Phone: [phone]   [facility] (second location)  [address] [place], [place] [address]  Phone: [phone]   [facility]) [place], [place], [place] [address] Phone: [phone]   [address] [place],  [address]  Phone: [phone]  WHAT OB PATIENTS CAN EXPECT   Confirmation of pregnancy and ultrasound ordered if medically indicated-[redacted] weeks gestation  New OB (NOB) intake with nurse and New OB (NOB) labs- [redacted] weeks gestation  New OB (NOB) physical examination with provider- 11/[redacted] weeks gestation  Flu vaccine-[redacted] weeks gestation  Anatomy scan-[redacted] weeks gestation  Glucose tolerance test, blood work to test for anemia, T-dap vaccine-[redacted] weeks gestation  Vaginal swabs/cultures-STD/Group B strep-[redacted] weeks gestation  Appointments every 4 weeks until 28 weeks  Every 2 weeks from 28 weeks until 36 weeks  Weekly visits from 36 weeks until delivery    Common Medications Safe in Pregnancy  Acne:      Constipation:  Benzoyl Peroxide     Colace  Clindamycin      Dulcolax Suppository  Topica Erythromycin     Fibercon  Salicylic Acid      Metamucil         Miralax AVOID:        Senakot   Accutane    Cough:  Retin-A       Cough Drops  Tetracycline      Phenergan w/ Codeine if Rx  Minocycline      Robitussin (Plain & DM)  Antibiotics:     Crabs/Lice:  Ceclor       RID  Cephalosporins    AVOID:  E-Mycins      Kwell  Keflex  Macrobid/Macrodantin   Diarrhea:  Penicillin      Kao-Pectate  Zithromax      Imodium AD         PUSH FLUIDS AVOID:       Cipro     Fever:  Tetracycline      Tylenol (Regular or Extra  Minocycline       Strength)  Levaquin      Extra Strength-Do not          Exceed 8 tabs/24 hrs Caffeine:        <259m/day (equiv. To 1 cup of coffee or  approx. 3 12 oz sodas)         Gas: Cold/Hayfever:       Gas-X  Benadryl      Mylicon  Claritin       Phazyme  **Claritin-D        Chlor-Trimeton    Headaches:  Dimetapp      ASA-Free Excedrin  Drixoral-Non-Drowsy     Cold Compress  Mucinex (Guaifenasin)     Tylenol (Regular or Extra  Sudafed/Sudafed-12 Hour     Strength)  **Sudafed PE Pseudoephedrine   Tylenol Cold & Sinus     Vicks Vapor Rub  Zyrtec  **AVOID if Problems With Blood  Pressure         Heartburn: Avoid lying down for at least 1 hour after meals  Aciphex      Maalox     Rash:  Milk of Magnesia     Benadryl  Mylanta       1% Hydrocortisone Cream  Pepcid  Pepcid Complete   Sleep Aids:  Prevacid      Ambien   Prilosec       Benadryl  Rolaids       Chamomile Tea  Tums (Limit 4/day)     Unisom         Tylenol PM         Warm milk-add vanilla or  Hemorrhoids:       Sugar for taste  Anusol/Anusol H.C.  (RX: Analapram 2.5%)  Sugar Substitutes:  Hydrocortisone OTC     Ok in moderation  Preparation H      Tucks        Vaseline lotion applied to tissue with wiping    Herpes:     Throat:  Acyclovir      Oragel  Famvir  Valtrex     Vaccines:         Flu Shot Leg Cramps:       *Gardasil  Benadryl      Hepatitis A         Hepatitis B Nasal Spray:       Pneumovax  Saline Nasal Spray     Polio Booster         Tetanus Nausea:       Tuberculosis test or PPD  Vitamin B6 25 mg TID   AVOID:    Dramamine      *Gardasil  Emetrol       Live Poliovirus  Ginger Root 250 mg QID    MMR (measles, mumps &  High Complex Carbs @ Bedtime    rebella)  Sea Bands-Accupressure    Varicella (Chickenpox)  Unisom 1/2 tab TID     *No known complications           If received before Pain:         Known pregnancy;   Darvocet       Resume series after  Lortab        Delivery  Percocet    Yeast:   Tramadol      Femstat  Tylenol 3      Gyne-lotrimin  Ultram       Monistat  Vicodin           MISC:         All Sunscreens           Hair Coloring/highlights          Insect Repellant's          (Including DEET)         Mystic Tans   Third Trimester of Pregnancy  The third trimester is from week 28 through week 40 (months 7 through 9). This trimester is when your unborn baby (fetus) is growing very fast. At the end of the ninth month, the unborn baby is about 20 inches in length. It weighs about 6-10 pounds. Follow these instructions at home: Medicines  Take  over-the-counter and prescription medicines only as told by your doctor. Some medicines are safe and some medicines are not safe during pregnancy.  Take a prenatal vitamin that contains at least 600 micrograms (mcg) of folic acid.  If you have trouble pooping (constipation), take medicine that will make your stool soft (stool softener) if your doctor approves. Eating and drinking   Eat regular, healthy meals.  Avoid raw meat and uncooked cheese.  If you get low calcium from the food you  eat, talk to your doctor about taking a daily calcium supplement.  Eat four or five small meals rather than three large meals a day.  Avoid foods that are high in fat and sugars, such as fried and sweet foods.  To prevent constipation: ? Eat foods that are high in fiber, like fresh fruits and vegetables, whole grains, and beans. ? Drink enough fluids to keep your pee (urine) clear or pale yellow. Activity  Exercise only as told by your doctor. Stop exercising if you start to have cramps.  Avoid heavy lifting, wear low heels, and sit up straight.  Do not exercise if it is too hot, too humid, or if you are in a place of great height (high altitude).  You may continue to have sex unless your doctor tells you not to. Relieving pain and discomfort  Wear a good support bra if your breasts are tender.  Take frequent breaks and rest with your legs raised if you have leg cramps or low back pain.  Take warm water baths (sitz baths) to soothe pain or discomfort caused by hemorrhoids. Use hemorrhoid cream if your doctor approves.  If you develop puffy, bulging veins (varicose veins) in your legs: ? Wear support hose or compression stockings as told by your doctor. ? Raise (elevate) your feet for 15 minutes, 3-4 times a day. ? Limit salt in your food. Safety  Wear your seat belt when driving.  Make a list of emergency phone numbers, including numbers for family, friends, the hospital, and police and  fire departments. Preparing for your baby's arrival To prepare for the arrival of your baby:  Take prenatal classes.  Practice driving to the hospital.  Visit the hospital and tour the maternity area.  Talk to your work about taking leave once the baby comes.  Pack your hospital bag.  Prepare the baby's room.  Go to your doctor visits.  Buy a rear-facing car seat. Learn how to install it in your car. General instructions  Do not use hot tubs, steam rooms, or saunas.  Do not use any products that contain nicotine or tobacco, such as cigarettes and e-cigarettes. If you need help quitting, ask your doctor.  Do not drink alcohol.  Do not douche or use tampons or scented sanitary pads.  Do not cross your legs for long periods of time.  Do not travel for long distances unless you must. Only do so if your doctor says it is okay.  Visit your dentist if you have not gone during your pregnancy. Use a soft toothbrush to brush your teeth. Be gentle when you floss.  Avoid cat litter boxes and soil used by cats. These carry germs that can cause birth defects in the baby and can cause a loss of your baby (miscarriage) or stillbirth.  Keep all your prenatal visits as told by your doctor. This is important. Contact a doctor if:  You are not sure if you are in labor or if your water has broken.  You are dizzy.  You have mild cramps or pressure in your lower belly.  You have a nagging pain in your belly area.  You continue to feel sick to your stomach, you throw up, or you have watery poop.  You have bad smelling fluid coming from your vagina.  You have pain when you pee. Get help right away if:  You have a fever.  You are leaking fluid from your vagina.  You are spotting or bleeding from your  vagina.  You have severe belly cramps or pain.  You lose or gain weight quickly.  You have trouble catching your breath and have chest pain.  You notice sudden or extreme  puffiness (swelling) of your face, hands, ankles, feet, or legs.  You have not felt the baby move in over an hour.  You have severe headaches that do not go away with medicine.  You have trouble seeing.  You are leaking, or you are having a gush of fluid, from your vagina before you are 37 weeks.  You have regular belly spasms (contractions) before you are 37 weeks. Summary  The third trimester is from week 28 through week 40 (months 7 through 9). This time is when your unborn baby is growing very fast.  Follow your doctor's advice about medicine, food, and activity.  Get ready for the arrival of your baby by taking prenatal classes, getting all the baby items ready, preparing the baby's room, and visiting your doctor to be checked.  Get help right away if you are bleeding from your vagina, or you have chest pain and trouble catching your breath, or if you have not felt your baby move in over an hour. This information is not intended to replace advice given to you by your health care provider. Make sure you discuss any questions you have with your health care provider. Document Revised: 04/01/2019 Document Reviewed: 01/14/2017 Elsevier Patient Education  [facility].

## 2020-10-10 LAB — CBC
Hematocrit: 29.1 % — ABNORMAL LOW (ref 34.0–46.6)
Hemoglobin: 9.8 g/dL — ABNORMAL LOW (ref 11.1–15.9)
MCH: 29.3 pg (ref 26.6–33.0)
MCHC: 33.7 g/dL (ref 31.5–35.7)
MCV: 87 fL (ref 79–97)
Platelets: 251 10*3/uL (ref 150–450)
RBC: 3.34 x10E6/uL — ABNORMAL LOW (ref 3.77–5.28)
RDW: 11.9 % (ref 11.7–15.4)
WBC: 7.1 10*3/uL (ref 3.4–10.8)

## 2020-10-10 LAB — RPR: RPR Ser Ql: NONREACTIVE

## 2020-10-10 LAB — GLUCOSE, 1 HOUR GESTATIONAL: Gestational Diabetes Screen: 119 mg/dL (ref 65–139)

## 2020-10-12 ENCOUNTER — Encounter: Payer: Self-pay | Admitting: Certified Nurse Midwife

## 2020-10-12 DIAGNOSIS — O99019 Anemia complicating pregnancy, unspecified trimester: Secondary | ICD-10-CM | POA: Insufficient documentation

## 2020-10-23 ENCOUNTER — Ambulatory Visit (INDEPENDENT_AMBULATORY_CARE_PROVIDER_SITE_OTHER): Payer: Managed Care, Other (non HMO) | Admitting: Certified Nurse Midwife

## 2020-10-23 ENCOUNTER — Other Ambulatory Visit: Payer: Self-pay

## 2020-10-23 VITALS — BP 105/62 | HR 81 | Wt 151.1 lb

## 2020-10-23 DIAGNOSIS — O10919 Unspecified pre-existing hypertension complicating pregnancy, unspecified trimester: Secondary | ICD-10-CM

## 2020-10-23 DIAGNOSIS — Z3A30 30 weeks gestation of pregnancy: Secondary | ICD-10-CM

## 2020-10-23 NOTE — Progress Notes (Signed)
ROB doing well. Feels good fetal movement. RSB reviewed. See ob check list for topics reviewed. Discussed growth/afi , nst next appointment for chronic hypertension with medication. She verbalizes and agrees to plan. She was asking about inductions. Discussed scheduling closer to time of 37-38 wks approximately 7-10 prior to induction. She verbalizes understanding. Follow up 2 wk with Marcelino Duster.   [name], CNM

## 2020-11-07 ENCOUNTER — Ambulatory Visit: Payer: Managed Care, Other (non HMO) | Admitting: Allergy & Immunology

## 2020-11-09 ENCOUNTER — Other Ambulatory Visit: Payer: Self-pay

## 2020-11-09 ENCOUNTER — Ambulatory Visit: Payer: Managed Care, Other (non HMO) | Admitting: Certified Nurse Midwife

## 2020-11-09 ENCOUNTER — Other Ambulatory Visit (HOSPITAL_COMMUNITY)
Admission: RE | Admit: 2020-11-09 | Discharge: 2020-11-09 | Disposition: A | Discharge status: Home / Self Care | Payer: Managed Care, Other (non HMO) | Source: Ambulatory Visit | Attending: Certified Nurse Midwife | Admitting: Certified Nurse Midwife

## 2020-11-09 ENCOUNTER — Other Ambulatory Visit: Payer: Managed Care, Other (non HMO)

## 2020-11-09 ENCOUNTER — Ambulatory Visit (INDEPENDENT_AMBULATORY_CARE_PROVIDER_SITE_OTHER): Payer: Managed Care, Other (non HMO)

## 2020-11-09 ENCOUNTER — Encounter: Payer: Self-pay | Admitting: Certified Nurse Midwife

## 2020-11-09 VITALS — BP 121/74 | HR 99 | Wt 154.1 lb

## 2020-11-09 DIAGNOSIS — N898 Other specified noninflammatory disorders of vagina: Secondary | ICD-10-CM | POA: Insufficient documentation

## 2020-11-09 DIAGNOSIS — O26893 Other specified pregnancy related conditions, third trimester: Secondary | ICD-10-CM | POA: Insufficient documentation

## 2020-11-09 DIAGNOSIS — O10919 Unspecified pre-existing hypertension complicating pregnancy, unspecified trimester: Secondary | ICD-10-CM

## 2020-11-09 DIAGNOSIS — Z3A30 30 weeks gestation of pregnancy: Secondary | ICD-10-CM

## 2020-11-09 DIAGNOSIS — Z3A32 32 weeks gestation of pregnancy: Secondary | ICD-10-CM

## 2020-11-09 DIAGNOSIS — K219 Gastro-esophageal reflux disease without esophagitis: Secondary | ICD-10-CM

## 2020-11-09 DIAGNOSIS — O99619 Diseases of the digestive system complicating pregnancy, unspecified trimester: Secondary | ICD-10-CM

## 2020-11-09 DIAGNOSIS — Z3403 Encounter for supervision of normal first pregnancy, third trimester: Secondary | ICD-10-CM | POA: Diagnosis not present

## 2020-11-09 LAB — POCT URINALYSIS DIPSTICK OB
Bilirubin, UA: NEGATIVE
Blood, UA: NEGATIVE
Glucose, UA: NEGATIVE
Ketones, UA: NEGATIVE
Leukocytes, UA: NEGATIVE
Nitrite, UA: NEGATIVE
POC,PROTEIN,UA: NEGATIVE
Spec Grav, UA: 1.01 (ref 1.010–1.025)
Urobilinogen, UA: 0.2 E.U./dL
pH, UA: 7.5 (ref 5.0–8.0)

## 2020-11-09 NOTE — Progress Notes (Signed)
ROB-Reports increased vaginal discharge and loss of mucus plug earlier today with mild cramping. Cervix closed upon visual inspection and vaginal swab collected; will contact patient with results. Growth [place] today wnl, see below. NST reactive, see chart. Anticipatory guidance regarding course of prenatal care. Reviewed red flag symptoms and when to call. RTC x 1 week for NST and ROB or sooner if needed.   ULTRASOUND REPORT  Location: [facility] Date of Service: 11/09/2020   Indications:growth/afi Findings:  Mason Jim intrauterine pregnancy is visualized with FHR at 146 BPM.  Fetal presentation is Cephalic.  Placenta: posterior. Grade: 1 AFI: 11.2 cm  Growth percentile is 36. EFW: 2018 g ( 4 lbs 7 oz)   Impression: 1. [redacted]w[redacted]d Viable Singleton Intrauterine pregnancy previously established criteria. 2. Growth is 36 %ile.  AFI is 11.2 cm.   Recommendations: 1.Clinical correlation with the patient's History and Physical Exam.

## 2020-11-09 NOTE — Patient Instructions (Addendum)
Nonstress Test A nonstress test is a procedure that is done during pregnancy in order to check the baby's heartbeat. The procedure can help show if the baby (fetus) is healthy. It is commonly done when:  The baby is past his or her due date.  The pregnancy is high risk.  The baby is moving less than normal.  The mother has lost a pregnancy in the past.  The health care provider suspects a problem with the baby's growth.  There is too much or too little amniotic fluid. The procedure is often done in the third trimester of pregnancy to find out if an early delivery is needed and whether such a delivery is safe. During a nonstress test, the baby's heartbeat is monitored when the baby is resting and when the baby is moving. If the baby is healthy, the heart rate will increase when he or she moves or kicks and will return to normal when he or she rests. Tell a health care provider about:  Any allergies you have.  Any medical conditions you have.  All medicines you are taking, including vitamins, herbs, eye drops, creams, and over-the-counter medicines. What are the risks? There are no risks to you or your baby from a nonstress test. This procedure should not be painful or uncomfortable. What happens before the procedure?  Eat a meal right before the test or as directed by your health care provider. Food may help encourage the baby to move.  Use the restroom right before the test. What happens during the procedure?  Two monitors will be placed on your abdomen. One will record the baby's heart rate and the other will record the contractions of your uterus.  You may be asked to lie down on your side or to sit upright.  You may be given a button to press when you feel your baby move.  Your health care provider will listen to your baby's heartbeat and recorded it. He or she may also watch your baby's heartbeat on a screen.  If the baby seems to be sleeping, you may be asked to drink  some juice or soda, eat a snack, or change positions. The procedure may vary among health care providers and hospitals. What happens after the procedure?  Your health care provider will discuss the test results with you and make recommendations for the future. Depending on the results, your health care provider may order additional tests or another course of action.  If your health care provider gave you any diet or activity instructions, make sure to follow them.  Keep all follow-up visits as told by your health care provider. This is important. Summary  A nonstress test is a procedure that is done during pregnancy in order to check the baby's heartbeat. The procedure can help show if the baby is healthy.  The procedure is often done in the third trimester of pregnancy to find out if an early delivery is needed and whether such a delivery is safe.  During a nonstress test, the baby's heartbeat is monitored when the baby is resting and when the baby is moving. If the baby is healthy, the heart rate will increase when he or she moves or kicks and will return to normal when he or she rests.  Your health care provider will discuss the test results with you and make recommendations for the future. This information is not intended to replace advice given to you by your health care provider. Make sure you discuss any   questions you have with your health care provider. Document Revised: 03/20/2017 Document Reviewed: 03/20/2017 Elsevier Patient Education  2020 Elsevier Inc.   Heartburn During Pregnancy  Heartburn is pain or discomfort in the throat or chest. It may cause a burning feeling. It happens when stomach acid moves up into the tube that carries food from your mouth to your stomach (esophagus). Heartburn is common during pregnancy. It usually goes away or gets better after giving birth. Follow these instructions at home: Eating and drinking  Do not drink alcohol while you are  pregnant.  Figure out which foods and beverages make you feel worse, and avoid them.  Beverages that you may want to avoid include: ? Coffee and tea (with or without caffeine). ? Energy drinks and sports drinks. ? Bubbly (carbonated) drinks or sodas. ? Citrus fruit juices.  Foods that you may want to avoid include: ? Chocolate and cocoa. ? Peppermint and mint flavorings. ? Garlic, onions, and horseradish. ? Spicy and acidic foods. These include peppers, chili powder, curry powder, vinegar, hot sauces, and barbecue sauce. ? Citrus fruits, such as oranges, lemons, and limes. ? Tomato-based foods, such as red sauce, chili, and salsa. ? Fried and fatty foods, such as donuts, french fries, potato chips, and high-fat dressings. ? High-fat meats, such as hot dogs, cold cuts, sausage, ham, and bacon. ? High-fat dairy items, such as whole milk, butter, and cheese.  Eat small meals often, instead of large meals.  Avoid drinking a lot of liquid with your meals.  Avoid eating meals during the 2-3 hours before you go to bed.  Avoid lying down right after you eat.  Do not exercise right after you eat. Medicines  Take over-the-counter and prescription medicines only as told by your doctor.  Do not take aspirin, ibuprofen, or other NSAIDs unless your doctor tells you to do that.  Your doctor may tell you to avoid medicines that have sodium bicarbonate in them. General instructions   If told, raise the head of your bed about 6 inches (15 cm). You can do this by putting blocks under the legs. Sleeping with more pillows does not help with heartburn.  Do not use any products that contain nicotine or tobacco, such as cigarettes and e-cigarettes. If you need help quitting, ask your doctor.  Wear loose-fitting clothing.  Try to lower your stress, such as with yoga or meditation. If you need help, ask your doctor.  Stay at a healthy weight. If you are overweight, work with your doctor to  safely lose weight.  Keep all follow-up visits as told by your doctor. This is important. Contact a doctor if:  You get new symptoms.  Your symptoms do not get better with treatment.  You have weight loss and you do not know why.  You have trouble swallowing.  You make loud sounds when you breathe (wheeze).  You have a cough that does not go away.  You have heartburn often for more than 2 weeks.  You feel sick to your stomach (nauseous), and this does not get better with treatment.  You are throwing up (vomiting), and this does not get better with treatment.  You have pain in your belly (abdomen). Get help right away if:  You have very bad chest pain that spreads to your arm, neck, or jaw.  You feel sweaty, dizzy, or light-headed.  You have trouble breathing.  You have pain when swallowing.  You throw up and your throw-up looks like blood or  coffee grounds.  Your poop (stool) is bloody or black. This information is not intended to replace advice given to you by your health care provider. Make sure you discuss any questions you have with your health care provider. Document Revised: 04/01/2019 Document Reviewed: 08/26/2016 Elsevier Patient Education  2020 Elsevier Inc.   Fetal Movement Counts Patient Name: ________________________________________________ Patient Due Date: ____________________ What is a fetal movement count?  A fetal movement count is the number of times that you feel your baby move during a certain amount of time. This may also be called a fetal kick count. A fetal movement count is recommended for every pregnant woman. You may be asked to start counting fetal movements as early as week 28 of your pregnancy. Pay attention to when your baby is most active. You may notice your baby's sleep and wake cycles. You may also notice things that make your baby move more. You should do a fetal movement count:  When your baby is normally most active.  At the  same time each day. A good time to count movements is while you are resting, after having something to eat and drink. How do I count fetal movements? 1. Find a quiet, comfortable area. Sit, or lie down on your side. 2. Write down the date, the start time and stop time, and the number of movements that you felt between those two times. Take this information with you to your health care visits. 3. Write down your start time when you feel the first movement. 4. Count kicks, flutters, swishes, rolls, and jabs. You should feel at least 10 movements. 5. You may stop counting after you have felt 10 movements, or if you have been counting for 2 hours. Write down the stop time. 6. If you do not feel 10 movements in 2 hours, contact your health care provider for further instructions. Your health care provider may want to do additional tests to assess your baby's well-being. Contact a health care provider if:  You feel fewer than 10 movements in 2 hours.  Your baby is not moving like he or she usually does. Date: ____________ Start time: ____________ Stop time: ____________ Movements: ____________ Date: ____________ Start time: ____________ Stop time: ____________ Movements: ____________ Date: ____________ Start time: ____________ Stop time: ____________ Movements: ____________ Date: ____________ Start time: ____________ Stop time: ____________ Movements: ____________ Date: ____________ Start time: ____________ Stop time: ____________ Movements: ____________ Date: ____________ Start time: ____________ Stop time: ____________ Movements: ____________ Date: ____________ Start time: ____________ Stop time: ____________ Movements: ____________ Date: ____________ Start time: ____________ Stop time: ____________ Movements: ____________ Date: ____________ Start time: ____________ Stop time: ____________ Movements: ____________ This information is not intended to replace advice given to you by your health care  provider. Make sure you discuss any questions you have with your health care provider. Document Revised: 07/29/2019 Document Reviewed: 07/29/2019 Elsevier Patient Education  2020 [employer].

## 2020-11-10 LAB — COMPREHENSIVE METABOLIC PANEL
ALT: 13 IU/L (ref 0–32)
AST: 20 IU/L (ref 0–40)
Albumin/Globulin Ratio: 1.3 (ref 1.2–2.2)
Albumin: 3.7 g/dL — ABNORMAL LOW (ref 3.9–5.0)
Alkaline Phosphatase: 158 IU/L — ABNORMAL HIGH (ref 44–121)
BUN/Creatinine Ratio: 9 (ref 9–23)
BUN: 5 mg/dL — ABNORMAL LOW (ref 6–20)
Bilirubin Total: <0.2 mg/dL (ref 0.0–1.2)
CO2: 22 mmol/L (ref 20–29)
Calcium: 8.5 mg/dL — ABNORMAL LOW (ref 8.7–10.2)
Chloride: 105 mmol/L (ref 96–106)
Creatinine, Ser: 0.57 mg/dL (ref 0.57–1.00)
GFR calc Af Amer: 149 mL/min/{1.73_m2} (ref >59)
GFR calc non Af Amer: 129 mL/min/{1.73_m2} (ref >59)
Globulin, Total: 2.8 g/dL (ref 1.5–4.5)
Glucose: 79 mg/dL (ref 65–99)
Potassium: 4 mmol/L (ref 3.5–5.2)
Sodium: 139 mmol/L (ref 134–144)
Total Protein: 6.5 g/dL (ref 6.0–8.5)

## 2020-11-10 LAB — URIC ACID: Uric Acid: 2.9 mg/dL (ref 2.6–6.2)

## 2020-11-10 LAB — PROTEIN / CREATININE RATIO, URINE
Creatinine, Urine: 16.4 mg/dL
Protein, Ur: <4 mg/dL

## 2020-11-13 LAB — CERVICOVAGINAL ANCILLARY ONLY
Bacterial Vaginitis (gardnerella): NEGATIVE
Candida Glabrata: NEGATIVE
Candida Vaginitis: NEGATIVE
Comment: NEGATIVE
Comment: NEGATIVE
Comment: NEGATIVE

## 2020-11-14 ENCOUNTER — Encounter: Payer: Managed Care, Other (non HMO) | Admitting: Certified Nurse Midwife

## 2020-11-14 ENCOUNTER — Ambulatory Visit (INDEPENDENT_AMBULATORY_CARE_PROVIDER_SITE_OTHER): Payer: Managed Care, Other (non HMO) | Admitting: Certified Nurse Midwife

## 2020-11-14 ENCOUNTER — Other Ambulatory Visit: Payer: Managed Care, Other (non HMO)

## 2020-11-14 ENCOUNTER — Encounter: Payer: Self-pay | Admitting: Certified Nurse Midwife

## 2020-11-14 ENCOUNTER — Other Ambulatory Visit: Payer: Self-pay

## 2020-11-14 VITALS — BP 129/69 | HR 83 | Wt 157.4 lb

## 2020-11-14 DIAGNOSIS — Z3403 Encounter for supervision of normal first pregnancy, third trimester: Secondary | ICD-10-CM | POA: Diagnosis not present

## 2020-11-14 DIAGNOSIS — I10 Essential (primary) hypertension: Secondary | ICD-10-CM

## 2020-11-14 DIAGNOSIS — Z3A33 33 weeks gestation of pregnancy: Secondary | ICD-10-CM

## 2020-11-14 LAB — POCT URINALYSIS DIPSTICK OB
Bilirubin, UA: NEGATIVE
Blood, UA: NEGATIVE
Glucose, UA: NEGATIVE
Ketones, UA: NEGATIVE
Nitrite, UA: NEGATIVE
Spec Grav, UA: 1.015 (ref 1.010–1.025)
Urobilinogen, UA: 0.2 E.U./dL
pH, UA: 6.5 (ref 5.0–8.0)

## 2020-11-14 NOTE — Patient Instructions (Signed)
[place] [facility]  [facility]  [address], [place], [place] [address]  Phone: [phone]  [facility] (second location)  [address] [place], [place] [address]  Phone: [phone]  [facility] ([place]) [address], [place], [place] [address] Phone: [phone]  [facility]  [address] [place], [place] [address]  Phone: [phone] 

## 2020-11-14 NOTE — Progress Notes (Signed)
ROB doing well. Feels good movement. NST today for chronic hypertension on medications. She is having some lower back pain . Reviewed self help measure. She verbalizes and agrees to plan. Follow up 1 wk for NST , rob with Marcelino Duster.  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  Impression: reactive    [name], CNM

## 2020-11-23 ENCOUNTER — Other Ambulatory Visit: Payer: Self-pay

## 2020-11-23 ENCOUNTER — Ambulatory Visit (INDEPENDENT_AMBULATORY_CARE_PROVIDER_SITE_OTHER): Payer: Managed Care, Other (non HMO) | Admitting: Certified Nurse Midwife

## 2020-11-23 ENCOUNTER — Other Ambulatory Visit: Payer: Managed Care, Other (non HMO)

## 2020-11-23 VITALS — BP 123/69 | Wt 157.0 lb

## 2020-11-23 DIAGNOSIS — I10 Essential (primary) hypertension: Secondary | ICD-10-CM | POA: Diagnosis not present

## 2020-11-23 DIAGNOSIS — Z3403 Encounter for supervision of normal first pregnancy, third trimester: Secondary | ICD-10-CM | POA: Diagnosis not present

## 2020-11-23 DIAGNOSIS — Z3A34 34 weeks gestation of pregnancy: Secondary | ICD-10-CM | POA: Diagnosis not present

## 2020-11-23 LAB — POCT URINALYSIS DIPSTICK OB
Bilirubin, UA: NEGATIVE
Blood, UA: NEGATIVE
Glucose, UA: NEGATIVE
Ketones, UA: NEGATIVE
Leukocytes, UA: NEGATIVE
Nitrite, UA: NEGATIVE
Spec Grav, UA: 1.025
Urobilinogen, UA: 0.2 U/dL
pH, UA: 6

## 2020-11-23 NOTE — Progress Notes (Signed)
Pt present for routine prenatal visit. No complaints. °

## 2020-11-23 NOTE — Progress Notes (Signed)
ROB-Doing well, no questions or concerns. NST today reactive, see chart. Encouraged twice daily kick counts. Anticipatory guidance regarding course of prenatal care. Desires IOL on 12/08/2020, will schedule due to history of chronic hypertension on medication. Reviewed red flag symptoms and when to call. RTC x 1 week for NST and ROB or sooner if needed.

## 2020-11-23 NOTE — Patient Instructions (Signed)
Nonstress Test °A nonstress test is a procedure that is done during pregnancy in order to check the baby's heartbeat. The procedure can help show if the baby (fetus) is healthy. It is commonly done when: °· The baby is past his or her due date. °· The pregnancy is high risk. °· The baby is moving less than normal. °· The mother has lost a pregnancy in the past. °· The health care provider suspects a problem with the baby's growth. °· There is too much or too little amniotic fluid. °The procedure is often done in the third trimester of pregnancy to find out if an early delivery is needed and whether such a delivery is safe. °During a nonstress test, the baby's heartbeat is monitored when the baby is resting and when the baby is moving. If the baby is healthy, the heart rate will increase when he or she moves or kicks and will return to normal when he or she rests. °Tell a health care provider about: °· Any allergies you have. °· Any medical conditions you have. °· All medicines you are taking, including vitamins, herbs, eye drops, creams, and over-the-counter medicines. °What are the risks? °There are no risks to you or your baby from a nonstress test. This procedure should not be painful or uncomfortable. °What happens before the procedure? °· Eat a meal right before the test or as directed by your health care provider. Food may help encourage the baby to move. °· Use the restroom right before the test. °What happens during the procedure? °· Two monitors will be placed on your abdomen. One will record the baby's heart rate and the other will record the contractions of your uterus. °· You may be asked to lie down on your side or to sit upright. °· You may be given a button to press when you feel your baby move. °· Your health care provider will listen to your baby's heartbeat and recorded it. He or she may also watch your baby's heartbeat on a screen. °· If the baby seems to be sleeping, you may be asked to drink  some juice or soda, eat a snack, or change positions. °The procedure may vary among health care providers and hospitals. °What happens after the procedure? °· Your health care provider will discuss the test results with you and make recommendations for the future. Depending on the results, your health care provider may order additional tests or another course of action. °· If your health care provider gave you any diet or activity instructions, make sure to follow them. °· Keep all follow-up visits as told by your health care provider. This is important. °Summary °· A nonstress test is a procedure that is done during pregnancy in order to check the baby's heartbeat. The procedure can help show if the baby is healthy. °· The procedure is often done in the third trimester of pregnancy to find out if an early delivery is needed and whether such a delivery is safe. °· During a nonstress test, the baby's heartbeat is monitored when the baby is resting and when the baby is moving. If the baby is healthy, the heart rate will increase when he or she moves or kicks and will return to normal when he or she rests. °· Your health care provider will discuss the test results with you and make recommendations for the future. °This information is not intended to replace advice given to you by your health care provider. Make sure you discuss any   questions you have with your health care provider. °Document Revised: 03/20/2017 Document Reviewed: 03/20/2017 °Elsevier Patient Education © 2020 Elsevier Inc. ° ° ° °Fetal Movement Counts °Patient Name: ________________________________________________ Patient Due Date: ____________________ °What is a fetal movement count? ° °A fetal movement count is the number of times that you feel your baby move during a certain amount of time. This may also be called a fetal kick count. A fetal movement count is recommended for every pregnant woman. You may be asked to start counting fetal movements as  early as week 28 of your pregnancy. °Pay attention to when your baby is most active. You may notice your baby's sleep and wake cycles. You may also notice things that make your baby move more. You should do a fetal movement count: °· When your baby is normally most active. °· At the same time each day. °A good time to count movements is while you are resting, after having something to eat and drink. °How do I count fetal movements? °1. Find a quiet, comfortable area. Sit, or lie down on your side. °2. Write down the date, the start time and stop time, and the number of movements that you felt between those two times. Take this information with you to your health care visits. °3. Write down your start time when you feel the first movement. °4. Count kicks, flutters, swishes, rolls, and jabs. You should feel at least 10 movements. °5. You may stop counting after you have felt 10 movements, or if you have been counting for 2 hours. Write down the stop time. °6. If you do not feel 10 movements in 2 hours, contact your health care provider for further instructions. Your health care provider may want to do additional tests to assess your baby's well-being. °Contact a health care provider if: °· You feel fewer than 10 movements in 2 hours. °· Your baby is not moving like he or she usually does. °Date: ____________ Start time: ____________ Stop time: ____________ Movements: ____________ °Date: ____________ Start time: ____________ Stop time: ____________ Movements: ____________ °Date: ____________ Start time: ____________ Stop time: ____________ Movements: ____________ °Date: ____________ Start time: ____________ Stop time: ____________ Movements: ____________ °Date: ____________ Start time: ____________ Stop time: ____________ Movements: ____________ °Date: ____________ Start time: ____________ Stop time: ____________ Movements: ____________ °Date: ____________ Start time: ____________ Stop time: ____________ Movements:  ____________ °Date: ____________ Start time: ____________ Stop time: ____________ Movements: ____________ °Date: ____________ Start time: ____________ Stop time: ____________ Movements: ____________ °This information is not intended to replace advice given to you by your health care provider. Make sure you discuss any questions you have with your health care provider. °Document Revised: 07/29/2019 Document Reviewed: 07/29/2019 °Elsevier Patient Education © 2020 Elsevier Inc. ° °

## 2020-11-29 ENCOUNTER — Ambulatory Visit (INDEPENDENT_AMBULATORY_CARE_PROVIDER_SITE_OTHER): Payer: Managed Care, Other (non HMO) | Admitting: Certified Nurse Midwife

## 2020-11-29 ENCOUNTER — Encounter: Payer: Self-pay | Admitting: Certified Nurse Midwife

## 2020-11-29 ENCOUNTER — Other Ambulatory Visit: Payer: Managed Care, Other (non HMO)

## 2020-11-29 ENCOUNTER — Other Ambulatory Visit: Payer: Self-pay

## 2020-11-29 VITALS — BP 133/63 | HR 80 | Wt 158.5 lb

## 2020-11-29 DIAGNOSIS — I10 Essential (primary) hypertension: Secondary | ICD-10-CM | POA: Diagnosis not present

## 2020-11-29 DIAGNOSIS — Z0289 Encounter for other administrative examinations: Secondary | ICD-10-CM

## 2020-11-29 DIAGNOSIS — Z3A35 35 weeks gestation of pregnancy: Secondary | ICD-10-CM

## 2020-11-29 LAB — POCT URINALYSIS DIPSTICK OB
Bilirubin, UA: NEGATIVE
Blood, UA: NEGATIVE
Glucose, UA: NEGATIVE
Ketones, UA: NEGATIVE
Leukocytes, UA: NEGATIVE
Nitrite, UA: NEGATIVE
POC,PROTEIN,UA: NEGATIVE
Spec Grav, UA: <=1.005 — AB (ref 1.010–1.025)
Urobilinogen, UA: 0.2 E.U./dL
pH, UA: 5 (ref 5.0–8.0)

## 2020-11-29 NOTE — Addendum Note (Signed)
Addended by: Brooke Dare on: 11/29/2020 04:20 PM   Modules accepted: Orders

## 2020-11-29 NOTE — Patient Instructions (Signed)
Group B Streptococcus Infection During Pregnancy °Group B Streptococcus (GBS) is a type of bacteria that is often found in healthy people. It is commonly found in the rectum, vagina, and intestines. In people who are healthy and not pregnant, the bacteria rarely cause serious illness or complications. However, women who test positive for GBS during pregnancy can pass the bacteria to the baby during childbirth. This can cause serious infection in the baby after birth. °Women with GBS may also have infections during their pregnancy or soon after childbirth. The infections include urinary tract infections (UTIs) or infections of the uterus. GBS also increases a woman's risk of complications during pregnancy, such as early labor or delivery, miscarriage, or stillbirth. Routine testing for GBS is recommended for all pregnant women. °What are the causes? °This condition is caused by bacteria called Streptococcus agalactiae. °What increases the risk? °You may have a higher risk for GBS infection during pregnancy if you had one during a past pregnancy. °What are the signs or symptoms? °In most cases, GBS infection does not cause symptoms in pregnant women. If symptoms exist, they may include: °· Labor that starts before the 37th week of pregnancy. °· A UTI or bladder infection. This may cause a fever, frequent urination, or pain and burning during urination. °· Fever during labor. There can also be a rapid heartbeat in the mother or baby. °Rare but serious symptoms of a GBS infection in women include: °· Blood infection (septicemia). This may cause fever, chills, or confusion. °· Lung infection (pneumonia). This may cause fever, chills, cough, rapid breathing, chest pain, or difficulty breathing. °· Bone, joint, skin, or soft tissue infection. °How is this diagnosed? °You may be screened for GBS between week 35 and week 37 of pregnancy. If you have symptoms of preterm labor, you may be screened earlier. This condition is  diagnosed based on lab test results from: °· A swab of fluid from the vagina and rectum. °· A urine sample. °How is this treated? °This condition is treated with antibiotic medicine. Antibiotic medicine may be given: °· To you when you go into labor, or as soon as your water breaks. The medicines will continue until after you give birth. If you are having a cesarean delivery, you do not need antibiotics unless your water has broken. °· To your baby, if he or she requires treatment. Your health care provider will check your baby to decide if he or she needs antibiotics to prevent a serious infection. °Follow these instructions at home: °· Take over-the-counter and prescription medicines only as told by your health care provider. °· Take your antibiotic medicine as told by your health care provider. Do not stop taking the antibiotic even if you start to feel better. °· Keep all pre-birth (prenatal) visits and follow-up visits as told by your health care provider. This is important. °Contact a health care provider if: °· You have pain or burning when you urinate. °· You have to urinate more often than usual. °· You have a fever or chills. °· You develop a bad-smelling vaginal discharge. °Get help right away if: °· Your water breaks. °· You go into labor. °· You have severe pain in your abdomen. °· You have difficulty breathing. °· You have chest pain. °These symptoms may represent a serious problem that is an emergency. Do not wait to see if the symptoms will go away. Get medical help right away. Call your local emergency services (911 in the [place] Do not drive yourself to   the hospital. °Summary °· GBS is a type of bacteria that is common in healthy people. °· During pregnancy, colonization with GBS can cause serious complications for you or your baby. °· Your health care provider will screen you between 35 and 37 weeks of pregnancy to determine if you are colonized with GBS. °· If you are colonized with GBS during  pregnancy, your health care provider will recommend antibiotics through an IV during labor. °· After delivery, your baby will be evaluated for complications related to potential GBS infection and may require antibiotics to prevent a serious infection. °This information is not intended to replace advice given to you by your health care provider. Make sure you discuss any questions you have with your health care provider. °Document Revised: 07/05/2019 Document Reviewed: 07/05/2019 °Elsevier Patient Education © 2020 Elsevier Inc. ° °

## 2020-11-29 NOTE — Progress Notes (Signed)
ROB & NST for hypertension on medications. Discussed NST wkly until delivery. Induction @ 39 wks. GBS and cultures today. Will follow up with results. ROB 1 wk with Marcelino Duster .  NST reactive Baseline: 125 Accelerations present Declarations absent Moderated variability No decelerations  Ct irritability

## 2020-11-30 ENCOUNTER — Telehealth: Payer: Self-pay

## 2020-11-30 NOTE — Telephone Encounter (Signed)
Voice mail message left for patient- per [name] CNM patient is to report to the ED at [facility] 12/07/20 at 12:00 midnight for induction of labor on 12/08/20.

## 2020-12-01 ENCOUNTER — Telehealth: Payer: Self-pay

## 2020-12-01 LAB — STREP GP B NAA: Strep Gp B NAA: NEGATIVE

## 2020-12-01 NOTE — Telephone Encounter (Signed)
Returned patients call- gave her the induction instructions as documented in chart- a voice mail was left on her mobile phone 11/30/20. Patient expressed understanding.

## 2020-12-01 NOTE — Telephone Encounter (Signed)
Pt called in and stated that she hasn't hear back about her induction. The pt is requesting to know when it will be and who it will be with. I told the pt I will send a message to the nurse and that she will be in touch with her. The pt verbally understood. Please advise

## 2020-12-03 LAB — GC/CHLAMYDIA PROBE AMP
Chlamydia trachomatis, NAA: NEGATIVE
Neisseria Gonorrhoeae by PCR: NEGATIVE

## 2020-12-04 ENCOUNTER — Telehealth: Payer: Self-pay

## 2020-12-04 NOTE — Telephone Encounter (Signed)
mychart message sent to patient- disability papers ready for pickup per patient request.

## 2020-12-05 ENCOUNTER — Other Ambulatory Visit: Payer: Self-pay | Admitting: Certified Nurse Midwife

## 2020-12-06 ENCOUNTER — Other Ambulatory Visit: Payer: Self-pay | Admitting: Certified Nurse Midwife

## 2020-12-06 DIAGNOSIS — Z3493 Encounter for supervision of normal pregnancy, unspecified, third trimester: Secondary | ICD-10-CM

## 2020-12-07 ENCOUNTER — Ambulatory Visit (INDEPENDENT_AMBULATORY_CARE_PROVIDER_SITE_OTHER): Payer: Managed Care, Other (non HMO)

## 2020-12-07 ENCOUNTER — Other Ambulatory Visit: Payer: Self-pay

## 2020-12-07 ENCOUNTER — Ambulatory Visit (INDEPENDENT_AMBULATORY_CARE_PROVIDER_SITE_OTHER): Payer: Managed Care, Other (non HMO) | Admitting: Certified Nurse Midwife

## 2020-12-07 ENCOUNTER — Other Ambulatory Visit: Payer: Managed Care, Other (non HMO)

## 2020-12-07 VITALS — BP 131/82 | Wt 158.7 lb

## 2020-12-07 DIAGNOSIS — O10919 Unspecified pre-existing hypertension complicating pregnancy, unspecified trimester: Secondary | ICD-10-CM

## 2020-12-07 DIAGNOSIS — Z3403 Encounter for supervision of normal first pregnancy, third trimester: Secondary | ICD-10-CM

## 2020-12-07 DIAGNOSIS — Z3A36 36 weeks gestation of pregnancy: Secondary | ICD-10-CM | POA: Diagnosis not present

## 2020-12-07 DIAGNOSIS — Z3493 Encounter for supervision of normal pregnancy, unspecified, third trimester: Secondary | ICD-10-CM

## 2020-12-07 LAB — POCT URINALYSIS DIPSTICK OB
Bilirubin, UA: NEGATIVE
Blood, UA: NEGATIVE
Glucose, UA: NEGATIVE
Ketones, UA: NEGATIVE
Leukocytes, UA: NEGATIVE
Nitrite, UA: NEGATIVE
Spec Grav, UA: 1.015 (ref 1.010–1.025)
Urobilinogen, UA: 0.2 E.U./dL
pH, UA: 7 (ref 5.0–8.0)

## 2020-12-07 NOTE — Progress Notes (Signed)
ROB-Doing well, scheduled for IOL tonight. NST reactive, see chart. Growth ultrasound wnl, see below. Reviewed red flag symptoms and when to call. RTC as previously scheduled or sooner if needed. Reports to Labor & Delivery tonight for IOL or sooner if needed.   ULTRASOUND REPORT  Location: [facility] Date of Service: 12/07/2020   Indications:growth/afi Findings:  Mason Jim intrauterine pregnancy is visualized with FHR at 120 BPM.  Fetal presentation is Cephalic.  Placenta: posterior. Grade: 1 AFI: 8.9 cm  Growth percentile is 15. EFW: 2593 g (5 lbs 11 oz)   Impression: 1. [redacted]w[redacted]d Viable Singleton Intrauterine pregnancy previously established criteria. 2. Growth is 15 %ile.  AFI is 8.9 cm.   Recommendations: 1.Clinical correlation with the patient's History and Physical Exam.

## 2020-12-07 NOTE — Patient Instructions (Addendum)
[place] [facility]ian List   [facility]  [address], [place], [place] [address]  Phone: [phone]   [facility] (second location)  [address] [place], [place] [address]  Phone: [phone]   [facility] ([place]) [address], [place], [place] [address] Phone: [phone]   [facility]  [address] [place],  [address]  Phone: [phone]     Labor Induction  Labor induction is when steps are taken to cause a pregnant woman to begin the labor process. Most women go into labor on their own between 37 weeks and 42 weeks of pregnancy. When this does not happen or when there is a medical need for labor to begin, steps may be taken to induce labor. Labor induction causes a pregnant woman's uterus to contract. It also causes the cervix to soften (ripen), open (dilate), and thin out (efface). Usually, labor is not induced before 39 weeks of pregnancy unless there is a medical reason to do so. Your health care provider will determine if labor induction is needed. Before inducing labor, your health care provider will consider a number of factors, including:  Your medical condition and your baby's.  How many weeks along you are in your pregnancy.  How mature your baby's lungs are.  The condition of your cervix.  The position of your baby.  The size of your birth canal. What are some reasons for labor induction? Labor may be induced if:  Your health or your baby's health is at risk.  Your pregnancy is overdue by 1 week or more.  Your water breaks but labor does not start on its own.  There is a low amount of amniotic fluid around your baby. You may also choose (elect) to have labor induced at a certain time. Generally, elective labor induction is done no earlier than 39 weeks of pregnancy. What methods are used for labor induction? Methods used for labor induction include:  Prostaglandin medicine.  This medicine starts contractions and causes the cervix to dilate and ripen. It can be taken by mouth (orally) or by being inserted into the vagina (suppository).  Inserting a small, thin tube (catheter) with a balloon into the vagina and then expanding the balloon with water to dilate the cervix.  Stripping the membranes. In this method, your health care provider gently separates amniotic sac tissue from the cervix. This causes the cervix to stretch, which in turn causes the release of a hormone called progesterone. The hormone causes the uterus to contract. This procedure is often done during an office visit, after which you will be sent home to wait for contractions to begin.  Breaking the water. In this method, your health care provider uses a small instrument to make a small hole in the amniotic sac. This eventually causes the amniotic sac to break. Contractions should begin after a few hours.  Medicine to trigger or strengthen contractions. This medicine is given through an IV that is inserted into a vein in your arm. Except for membrane stripping, which can be done in a clinic, labor induction is done in the hospital so that you and your baby can be carefully monitored. How long does it take for labor to be induced? The length of time it takes to induce labor depends on how ready your body is for labor. Some inductions can take up to 2-3 days, while others may take less than a day. Induction may take longer if:  You are induced   pregnancy.  It is your first pregnancy.  Your cervix is not ready. What are some risks associated with labor induction? Some risks associated with labor induction include:  Changes in fetal heart rate, such as being too high, too low, or irregular (erratic).  Failed induction.  Infection in the mother or the baby.  Increased risk of having a cesarean delivery.  Fetal death.  Breaking off (abruption) of the placenta from the uterus  (rare).  Rupture of the uterus (very rare). When induction is needed for medical reasons, the benefits of induction generally outweigh the risks. What are some reasons for not inducing labor? Labor induction should not be done if:  Your baby does not tolerate contractions.  You have had previous surgeries on your uterus, such as a myomectomy, removal of fibroids, or a vertical scar from a previous cesarean delivery.  Your placenta lies very low in your uterus and blocks the opening of the cervix (placenta previa).  Your baby is not in a head-down position.  The umbilical cord drops down into the birth canal in front of the baby.  There are unusual circumstances, such as the baby being very early (premature).  You have had more than 2 previous cesarean deliveries. Summary  Labor induction is when steps are taken to cause a pregnant woman to begin the labor process.  Labor induction causes a pregnant woman's uterus to contract. It also causes the cervix to ripen, dilate, and efface.  Labor is not induced before 39 weeks of pregnancy unless there is a medical reason to do so.  When induction is needed for medical reasons, the benefits of induction generally outweigh the risks. This information is not intended to replace advice given to you by your health care provider. Make sure you discuss any questions you have with your health care provider. Document Revised: 12/12/2017 Document Reviewed: 01/22/2017 Elsevier Patient Education  2020 [employer].

## 2020-12-08 ENCOUNTER — Inpatient Hospital Stay: Payer: Managed Care, Other (non HMO) | Admitting: Anesthesiology

## 2020-12-08 ENCOUNTER — Inpatient Hospital Stay
Admission: EM | Admit: 2020-12-08 | Discharge: 2020-12-09 | DRG: 807 | Disposition: A | Discharge status: Home / Self Care | Payer: Managed Care, Other (non HMO) | Attending: Certified Nurse Midwife | Admitting: Certified Nurse Midwife

## 2020-12-08 ENCOUNTER — Encounter: Payer: Self-pay | Admitting: Certified Nurse Midwife

## 2020-12-08 ENCOUNTER — Other Ambulatory Visit: Payer: Self-pay

## 2020-12-08 DIAGNOSIS — F419 Anxiety disorder, unspecified: Secondary | ICD-10-CM | POA: Diagnosis present

## 2020-12-08 DIAGNOSIS — Z20822 Contact with and (suspected) exposure to covid-19: Secondary | ICD-10-CM | POA: Diagnosis present

## 2020-12-08 DIAGNOSIS — O10919 Unspecified pre-existing hypertension complicating pregnancy, unspecified trimester: Secondary | ICD-10-CM

## 2020-12-08 DIAGNOSIS — O99344 Other mental disorders complicating childbirth: Secondary | ICD-10-CM | POA: Diagnosis present

## 2020-12-08 DIAGNOSIS — Z87891 Personal history of nicotine dependence: Secondary | ICD-10-CM

## 2020-12-08 DIAGNOSIS — Z2839 Other underimmunization status: Secondary | ICD-10-CM

## 2020-12-08 DIAGNOSIS — O1002 Pre-existing essential hypertension complicating childbirth: Secondary | ICD-10-CM | POA: Diagnosis present

## 2020-12-08 DIAGNOSIS — O10913 Unspecified pre-existing hypertension complicating pregnancy, third trimester: Secondary | ICD-10-CM | POA: Diagnosis not present

## 2020-12-08 DIAGNOSIS — O99013 Anemia complicating pregnancy, third trimester: Secondary | ICD-10-CM

## 2020-12-08 DIAGNOSIS — Z3A37 37 weeks gestation of pregnancy: Secondary | ICD-10-CM | POA: Diagnosis not present

## 2020-12-08 LAB — CBC
HCT: 29.1 % — ABNORMAL LOW (ref 36.0–46.0)
Hemoglobin: 9.2 g/dL — ABNORMAL LOW (ref 12.0–15.0)
MCH: 26.1 pg (ref 26.0–34.0)
MCHC: 31.6 g/dL (ref 30.0–36.0)
MCV: 82.7 fL (ref 80.0–100.0)
Platelets: 284 10*3/uL (ref 150–400)
RBC: 3.52 MIL/uL — ABNORMAL LOW (ref 3.87–5.11)
RDW: 13.9 % (ref 11.5–15.5)
WBC: 11.4 10*3/uL — ABNORMAL HIGH (ref 4.0–10.5)
nRBC: 0 % (ref 0.0–0.2)

## 2020-12-08 LAB — RESP PANEL BY RT-PCR (FLU A&B, COVID) ARPGX2
Influenza A by PCR: NEGATIVE
Influenza B by PCR: NEGATIVE
SARS Coronavirus 2 by RT PCR: NEGATIVE

## 2020-12-08 LAB — RPR: RPR Ser Ql: NONREACTIVE

## 2020-12-08 LAB — ABO/RH: ABO/RH(D): O POS

## 2020-12-08 LAB — TYPE AND SCREEN
ABO/RH(D): O POS
Antibody Screen: NEGATIVE

## 2020-12-08 MED ORDER — LACTATED RINGERS IV SOLN: INTRAVENOUS | Status: DC

## 2020-12-08 MED ORDER — OXYTOCIN-SODIUM CHLORIDE 30-0.9 UT/500ML-% IV SOLN
1.0000 m[IU]/min | INTRAVENOUS | Status: DC
  Administered 2020-12-08: 12:00:00 4 m[IU]/min via INTRAVENOUS

## 2020-12-08 MED ORDER — FAMOTIDINE 20 MG PO TABS
20.0000 mg | ORAL_TABLET | Freq: Every day | ORAL | Status: DC
  Administered 2020-12-09: 08:00:00 20 mg via ORAL
  Filled 2020-12-08 (×2): qty 1

## 2020-12-08 MED ORDER — ONDANSETRON HCL 4 MG/2ML IJ SOLN: 4.0000 mg | Freq: Four times a day (QID) | INTRAMUSCULAR | Status: DC | PRN

## 2020-12-08 MED ORDER — BUTORPHANOL TARTRATE 1 MG/ML IJ SOLN
1.0000 mg | INTRAMUSCULAR | Status: DC | PRN
  Administered 2020-12-08 (×2): 1 mg via INTRAVENOUS
  Filled 2020-12-08 (×2): qty 1

## 2020-12-08 MED ORDER — PHENYLEPHRINE 40 MCG/ML (10ML) SYRINGE FOR IV PUSH (FOR BLOOD PRESSURE SUPPORT)
80.0000 ug | PREFILLED_SYRINGE | INTRAVENOUS | Status: DC | PRN
  Filled 2020-12-08: qty 10

## 2020-12-08 MED ORDER — OXYTOCIN 10 UNIT/ML IJ SOLN
INTRAMUSCULAR | Status: AC
  Filled 2020-12-08: qty 2

## 2020-12-08 MED ORDER — COCONUT OIL OIL
1.0000 "application " | TOPICAL_OIL | Status: DC | PRN
  Administered 2020-12-09: 1 via TOPICAL
  Filled 2020-12-08: qty 120

## 2020-12-08 MED ORDER — BENZOCAINE-MENTHOL 20-0.5 % EX AERO
INHALATION_SPRAY | CUTANEOUS | Status: AC
  Filled 2020-12-08: qty 56

## 2020-12-08 MED ORDER — FENTANYL 2.5 MCG/ML W/ROPIVACAINE 0.15% IN NS 100 ML EPIDURAL (ARMC)
12.0000 mL/h | EPIDURAL | Status: DC
Start: 2020-12-08 — End: 2020-12-10
  Administered 2020-12-08: 11:00:00 12 mL/h via EPIDURAL

## 2020-12-08 MED ORDER — MISOPROSTOL 50MCG HALF TABLET
50.0000 ug | ORAL_TABLET | ORAL | Status: DC
  Administered 2020-12-08 (×2): 50 ug via VAGINAL
  Filled 2020-12-08 (×2): qty 1

## 2020-12-08 MED ORDER — FERROUS SULFATE 325 (65 FE) MG PO TABS
325.0000 mg | ORAL_TABLET | Freq: Every day | ORAL | Status: DC
  Administered 2020-12-09: 10:00:00 325 mg via ORAL
  Filled 2020-12-08: qty 1

## 2020-12-08 MED ORDER — ONDANSETRON HCL 4 MG/2ML IJ SOLN: 4.0000 mg | INTRAMUSCULAR | Status: DC | PRN

## 2020-12-08 MED ORDER — LIDOCAINE-EPINEPHRINE (PF) 1.5 %-1:200000 IJ SOLN
INTRAMUSCULAR | Status: DC | PRN
  Administered 2020-12-08: 3 mL via EPIDURAL

## 2020-12-08 MED ORDER — SIMETHICONE 80 MG PO CHEW: 80.0000 mg | CHEWABLE_TABLET | ORAL | Status: DC | PRN

## 2020-12-08 MED ORDER — OXYCODONE-ACETAMINOPHEN 5-325 MG PO TABS: 2.0000 | ORAL_TABLET | ORAL | Status: DC | PRN

## 2020-12-08 MED ORDER — SOD CITRATE-CITRIC ACID 500-334 MG/5ML PO SOLN: 30.0000 mL | ORAL | Status: DC | PRN

## 2020-12-08 MED ORDER — LIDOCAINE HCL (PF) 1 % IJ SOLN
INTRAMUSCULAR | Status: AC
  Filled 2020-12-08: qty 30

## 2020-12-08 MED ORDER — FENTANYL 2.5 MCG/ML W/ROPIVACAINE 0.15% IN NS 100 ML EPIDURAL (ARMC)
EPIDURAL | Status: AC
  Filled 2020-12-08: qty 100

## 2020-12-08 MED ORDER — LABETALOL HCL 100 MG PO TABS
100.0000 mg | ORAL_TABLET | Freq: Two times a day (BID) | ORAL | Status: DC
  Administered 2020-12-08 – 2020-12-09 (×4): 100 mg via ORAL
  Filled 2020-12-08 (×4): qty 1

## 2020-12-08 MED ORDER — MISOPROSTOL 200 MCG PO TABS
ORAL_TABLET | ORAL | Status: AC
  Filled 2020-12-08: qty 4

## 2020-12-08 MED ORDER — OXYTOCIN-SODIUM CHLORIDE 30-0.9 UT/500ML-% IV SOLN
2.5000 [IU]/h | INTRAVENOUS | Status: DC
  Filled 2020-12-08 (×2): qty 500

## 2020-12-08 MED ORDER — PRENATAL MULTIVITAMIN CH
1.0000 | ORAL_TABLET | Freq: Every day | ORAL | Status: DC
  Filled 2020-12-08: qty 1

## 2020-12-08 MED ORDER — DIBUCAINE (PERIANAL) 1 % EX OINT: 1.0000 "application " | TOPICAL_OINTMENT | CUTANEOUS | Status: DC | PRN

## 2020-12-08 MED ORDER — LACTATED RINGERS IV SOLN: 500.0000 mL | Freq: Once | INTRAVENOUS | Status: DC

## 2020-12-08 MED ORDER — EPHEDRINE 5 MG/ML INJ
10.0000 mg | INTRAVENOUS | Status: DC | PRN
  Filled 2020-12-08: qty 2

## 2020-12-08 MED ORDER — SERTRALINE HCL 100 MG PO TABS
100.0000 mg | ORAL_TABLET | Freq: Every day | ORAL | Status: DC
  Administered 2020-12-08 – 2020-12-09 (×2): 100 mg via ORAL
  Filled 2020-12-08 (×2): qty 1

## 2020-12-08 MED ORDER — SODIUM CHLORIDE (PF) 0.9 % IJ SOLN
INTRAMUSCULAR | Status: AC
  Filled 2020-12-08: qty 150

## 2020-12-08 MED ORDER — AMMONIA AROMATIC IN INHA
RESPIRATORY_TRACT | Status: AC
  Filled 2020-12-08: qty 10

## 2020-12-08 MED ORDER — BUPIVACAINE HCL (PF) 0.25 % IJ SOLN
INTRAMUSCULAR | Status: DC | PRN
  Administered 2020-12-08: 5 mL via EPIDURAL
  Administered 2020-12-08: 3 mL via EPIDURAL

## 2020-12-08 MED ORDER — WITCH HAZEL-GLYCERIN EX PADS
1.0000 "application " | MEDICATED_PAD | CUTANEOUS | Status: DC | PRN
  Administered 2020-12-09: 1 via TOPICAL
  Filled 2020-12-08: qty 100

## 2020-12-08 MED ORDER — OXYTOCIN BOLUS FROM INFUSION: 333.0000 mL | Freq: Once | INTRAVENOUS | Status: DC

## 2020-12-08 MED ORDER — ACETAMINOPHEN 325 MG PO TABS
650.0000 mg | ORAL_TABLET | ORAL | Status: DC | PRN
  Administered 2020-12-08 – 2020-12-09 (×2): 650 mg via ORAL
  Filled 2020-12-08 (×3): qty 2

## 2020-12-08 MED ORDER — IBUPROFEN 600 MG PO TABS
600.0000 mg | ORAL_TABLET | Freq: Four times a day (QID) | ORAL | Status: DC
  Administered 2020-12-08 – 2020-12-09 (×2): 600 mg via ORAL
  Filled 2020-12-08 (×2): qty 1

## 2020-12-08 MED ORDER — DOCUSATE SODIUM 100 MG PO CAPS
100.0000 mg | ORAL_CAPSULE | Freq: Two times a day (BID) | ORAL | Status: DC
  Administered 2020-12-09 (×2): 100 mg via ORAL
  Filled 2020-12-08 (×2): qty 1

## 2020-12-08 MED ORDER — LIDOCAINE HCL (PF) 1 % IJ SOLN: 30.0000 mL | INTRAMUSCULAR | Status: DC | PRN

## 2020-12-08 MED ORDER — LACTATED RINGERS IV SOLN: 500.0000 mL | INTRAVENOUS | Status: DC | PRN

## 2020-12-08 MED ORDER — ONDANSETRON HCL 4 MG PO TABS: 4.0000 mg | ORAL_TABLET | ORAL | Status: DC | PRN

## 2020-12-08 MED ORDER — BENZOCAINE-MENTHOL 20-0.5 % EX AERO
1.0000 "application " | INHALATION_SPRAY | CUTANEOUS | Status: DC | PRN
  Administered 2020-12-09: 1 via TOPICAL
  Filled 2020-12-08: qty 56

## 2020-12-08 MED ORDER — OXYCODONE-ACETAMINOPHEN 5-325 MG PO TABS: 1.0000 | ORAL_TABLET | ORAL | Status: DC | PRN

## 2020-12-08 MED ORDER — TERBUTALINE SULFATE 1 MG/ML IJ SOLN: 0.2500 mg | Freq: Once | INTRAMUSCULAR | Status: DC | PRN

## 2020-12-08 MED ORDER — SENNOSIDES-DOCUSATE SODIUM 8.6-50 MG PO TABS
2.0000 | ORAL_TABLET | ORAL | Status: DC
  Administered 2020-12-08: 21:00:00 2 via ORAL
  Filled 2020-12-08: qty 2

## 2020-12-08 MED ORDER — DIPHENHYDRAMINE HCL 50 MG/ML IJ SOLN: 12.5000 mg | INTRAMUSCULAR | Status: DC | PRN

## 2020-12-08 NOTE — Lactation Note (Addendum)
This note was copied from a baby's chart. Lactation Consultation Note  Patient Name: Dana Hood JSEGB'T Date: 12/08/2020 Reason for consult: Initial assessment;Primapara;1st time breastfeeding;Early term 37-38.6wks   Maternal Data Formula Feeding for Exclusion: No Has patient been taught Hand Expression?: Yes Does the patient have breastfeeding experience prior to this delivery?: No  Feeding Feeding Type: Breast Fed First attempt at feeding after delivery, baby in cradle hold on left breast, crying, rooting, but unable to clamp down and coordinate latch, colostrum expressed at mouth, attempted on right breast, still not latching, breasts firm with tight areola and sl flat nipples    LATCH Score Latch: Too sleepy or reluctant, no latch achieved, no sucking elicited.  Audible Swallowing: None  Type of Nipple: Flat  Comfort (Breast/Nipple): Soft / non-tender  Hold (Positioning): Assistance needed to correctly position infant at breast and maintain latch.  LATCH Score: 4  Interventions Interventions: Breast feeding basics reviewed;Assisted with latch;Skin to skin;Breast massage;Hand express;Breast compression;Position options;Expressed milk  Lactation Tools Discussed/Used WIC Program: No   Consult Status Consult Status: Follow-up Date: 12/08/20 Follow-up type: In-patient    Dana Hood 12/08/2020, 5:25 PM

## 2020-12-08 NOTE — Anesthesia Preprocedure Evaluation (Signed)
Anesthesia Evaluation  Patient identified by MRN, date of birth, ID band Patient awake    History of Anesthesia Complications Negative for: history of anesthetic complications  Airway Mallampati: I  TM Distance: >3 FB Neck ROM: Full    Dental  (+) Teeth Intact   Pulmonary former smoker,    Pulmonary exam normal        Cardiovascular hypertension, + dysrhythmias Supra Ventricular Tachycardia  Rhythm:Regular Rate:Normal     Neuro/Psych negative neurological ROS  negative psych ROS   GI/Hepatic Neg liver ROS, GERD  ,  Endo/Other  negative endocrine ROS  Renal/GU negative Renal ROS  negative genitourinary   Musculoskeletal   Abdominal   Peds negative pediatric ROS (+)  Hematology  (+) Blood dyscrasia, anemia ,   Anesthesia Other Findings   Reproductive/Obstetrics (+) Pregnancy                             Anesthesia Physical Anesthesia Plan  ASA: II  Anesthesia Plan: Epidural   Post-op Pain Management:    Induction:   PONV Risk Score and Plan:   Airway Management Planned:   Additional Equipment:   Intra-op Plan:   Post-operative Plan:   Informed Consent: I have reviewed the patients History and Physical, chart, labs and discussed the procedure including the risks, benefits and alternatives for the proposed anesthesia with the patient or authorized representative who has indicated his/her understanding and acceptance.       Plan Discussed with: Anesthesiologist and CRNA  Anesthesia Plan Comments:         Anesthesia Quick Evaluation

## 2020-12-08 NOTE — Progress Notes (Signed)
LABOR NOTE   Dana Hood 24 y.o.@ at [redacted]w[redacted]d  SUBJECTIVE:  Pt felt a gush of around 1024, nurse to bedside evaluated, cooks catheter came out. Pt uncomfortable requested epidural. Epidural was placed and she is now comfortable.  Analgesia: Epidural  OBJECTIVE:  BP (!) 139/99    Pulse 79    Temp 98.1 F (36.7 C) (Oral)    Resp 18    Ht 5\' 1"  (1.549 m)    Wt 71.7 kg    LMP 03/22/2020 (Exact Date)    BMI 29.85 kg/m  No intake/output data recorded.  She has shown cervical change. CERVIX: 4cm:  70%:   -2:   anterior:   firm SVE:   Dilation: 3.5 Effacement (%): 70 Station: -1,-2 Exam by:: A. Derrill Bagnell CONTRACTIONS: irregular, every 1-3 minutes FHR: Fetal heart tracing reviewed. Baseline: 120 bpm, Variability: Good {> 6 bpm), Accelerations: Non-reactive but appropriate for gestational age and Decelerations: while laying on her back for exam and foley placement. Resolved with position change Category II    Labs: Lab Results  Component Value Date   WBC 11.4 (H) 12/08/2020   HGB 9.2 (L) 12/08/2020   HCT 29.1 (L) 12/08/2020   MCV 82.7 12/08/2020   PLT 284 12/08/2020    ASSESSMENT: 1) Labor curve reviewed.       Progress: Early latent labor.     Membranes: ruptured, blood tinged      Active Problems:   Labor and delivery, indication for care Chronic hypertension   PLAN: IV Pitocin induction   12/10/2020, CNM  12/08/2020 11:44 AM

## 2020-12-08 NOTE — Lactation Note (Signed)
This note was copied from a baby's chart. Lactation Consultation Note  Patient Name: Dana Hood JFHLK'T Date: 12/08/2020 Reason for consult: Follow-up assessment;Primapara;1st time breastfeeding;Early term 37-38.6wks   Maternal Data Formula Feeding for Exclusion: No Has patient been taught Hand Expression?: Yes Does the patient have breastfeeding experience prior to this delivery?: No  Feeding Feeding Type: Breast Fed BAby not attempting to latch, sleepy despite expression of colostrum, 20 mm nipple shield applied and baby latched with pressure on lower jaw to widen mouth, would suck with much stimulation, off and on for 5 min, colostrum noted in shield after baby removed from breast, mom to attempt again without shield while watching for feeding cues, mother baby to follow up tonight. LATCH Score Latch: Repeated attempts needed to sustain latch, nipple held in mouth throughout feeding, stimulation needed to elicit sucking reflex.  Audible Swallowing: A few with stimulation  Type of Nipple: Flat  Comfort (Breast/Nipple): Soft / non-tender  Hold (Positioning): Assistance needed to correctly position infant at breast and maintain latch.  LATCH Score: 6  Interventions Interventions: Assisted with latch;Skin to skin;Hand express;Breast massage;Adjust position  Lactation Tools Discussed/Used Tools: Nipple Shields Nipple shield size: 20 WIC Program: No  Breastfeeding resource sheet  given Consult Status Consult Status: Follow-up Date: 12/09/20 Follow-up type: In-patient    Dyann Kief 12/08/2020, 7:53 PM

## 2020-12-08 NOTE — H&P (Signed)
History and Physical   HPI  Dana Hood is a 24 y.o. G1P0000 at [redacted]w[redacted]d Estimated Date of Delivery: 12/29/20 who is being admitted for induction of labor due to chronic hypertension with medication management.    OB History  OB History  Gravida Para Term Preterm AB Living  1 0 0 0 0 0  SAB IAB Ectopic Multiple Live Births  0 0 0 0 0    # Outcome Date GA Lbr Len/2nd Weight Sex Delivery Anes PTL Lv  1 Current             PROBLEM LIST  Pregnancy complications or risks: Patient Active Problem List   Diagnosis Date Noted  . Labor and delivery, indication for care 12/08/2020  . Anemia in pregnancy 10/12/2020  . Rubella non-immune status, antepartum 05/12/2020  . Anxiety 10/26/2019  . GERD (gastroesophageal reflux disease) 10/26/2019  . Chronic hypertension 10/26/2019  . Allergic urticaria history of and currently resolved  09/21/2019  . Migraines 01/30/2017    Prenatal labs and studies: ABO, Rh: --/--/O POS Performed at [facility], [address] [place], [place] [address]  [phone]) Antibody: NEG (12/17 0034) Rubella: <0.90 (05/20 1518) RPR: Non Reactive (10/18 0933)  HBsAg: Negative (05/20 1518)  HIV: Non Reactive (05/20 1518)  EVO:JJKKXFGH/-- (12/08 1647)   Past Medical History:  Diagnosis Date  . Allergy to alpha-gal   . Anxiety   . Atrial fibrillation with tachycardic ventricular rate (HCC)   . Hypertension      Past Surgical History:  Procedure Laterality Date  . NO PAST SURGERIES       Medications    Current Discharge Medication List    CONTINUE these medications which have NOT CHANGED   Details  aspirin EC 81 MG tablet Take 1 tablet (81 mg total) by mouth daily. Take after 12 weeks for prevention of preeclampssia later in pregnancy Qty: 300 tablet, Refills: 2    azelastine (ASTELIN) 0.1 % nasal spray Place 1 spray into both nostrils 2 (two) times daily. Use in each nostril as directed Qty: 30 mL, Refills: 12     cetirizine (ZYRTEC) 10 MG tablet Take 1 tablet (10 mg total) by mouth daily. Qty: 90 tablet, Refills: 1    labetalol (NORMODYNE) 100 MG tablet TAKE 1 TABLET BY MOUTH TWICE A DAY Qty: 180 tablet, Refills: 1    ondansetron (ZOFRAN ODT) 4 MG disintegrating tablet Take 1 tablet (4 mg total) by mouth every 6 (six) hours as needed for nausea. Qty: 20 tablet, Refills: 0    Prenatal Vit-Fe Fumarate-FA (PRENATAL VITAMINS PO) Take 1 tablet by mouth daily.    sertraline (ZOLOFT) 100 MG tablet TAKE 1 TABLET BY MOUTH EVERY DAY Qty: 30 tablet, Refills: 0    EPINEPHrine (ADRENACLICK) 0.3 mg/0.3 mL IJ SOAJ injection Inject 0.3 mLs (0.3 mg total) into the muscle as needed for anaphylaxis. Qty: 1 each, Refills: 1   Comments: Please help patient with drug card. She really needs this and the other one was to expensive. If another is cheaper on her plan please call me at [phone] and I will change.         Allergies  Alpha-gal  Review of Systems  Constitutional: negative Eyes: negative Ears, nose, mouth, throat, and face: negative Respiratory: negative Cardiovascular: negative Gastrointestinal: negative Genitourinary:negative Integument/breast: negative Hematologic/lymphatic: negative Musculoskeletal:negative Neurological: negative Behavioral/Psych: negative Endocrine: negative Allergic/Immunologic: negative  Physical Exam  BP (!) 146/96   Pulse 76   Temp  98.1 F (36.7 C) (Oral)   Resp 18   Ht 5\' 1"  (1.549 m)   Wt 71.7 kg   LMP 03/22/2020 (Exact Date)   BMI 29.85 kg/m   Lungs:  CTA B Cardio: RRR  Abd: Soft, gravid, NT Presentation: cephalic EXT: No C/C/ 1+ Edema DTRs: 2+ B CERVIX: Dilation: 1 Effacement (%): 60 Cervical Position: Anterior Station: -1,-2 Presentation: Undeterminable Exam by:: A. [email or web address]  See Prenatal records for more detailed PE.     FHR:  Baseline: 130 bpm, Variability: Good {> 6 bpm), Accelerations: Reactive and Decelerations:  Absent  Toco: Uterine Contractions: Frequency: Every 2-3 minutes, Duration: 40-60 seconds and Intensity: mild  Test Results  Results for orders placed or performed during the hospital encounter of 12/08/20 (from the past 24 hour(s))  Type and screen     Status: None   Collection Time: 12/08/20 12:34 AM  Result Value Ref Range   ABO/RH(D) O POS    Antibody Screen NEG    Sample Expiration      12/11/2020,2359 Performed at [facility], [address] [place], [place]   CBC     Status: Abnormal   Collection Time: 12/08/20 12:34 AM  Result Value Ref Range   WBC 11.4 (H) 4.0 - 10.5 K/uL   RBC 3.52 (L) 3.87 - 5.11 MIL/uL   Hemoglobin 9.2 (L) 12.0 - 15.0 g/dL   HCT 12/10/20 (L) 49.4 - 49.6 %   MCV 82.7 80.0 - 100.0 fL   MCH 26.1 26.0 - 34.0 pg   MCHC 31.6 30.0 - 36.0 g/dL   RDW 75.9 16.3 - 84.6 %   Platelets 284 150 - 400 K/uL   nRBC 0.0 0.0 - 0.2 %  Resp Panel by RT-PCR (Flu A&B, Covid) Nasopharyngeal Swab     Status: None   Collection Time: 12/08/20 12:34 AM   Specimen: Nasopharyngeal Swab; Nasopharyngeal(NP) swabs in vial transport medium  Result Value Ref Range   SARS Coronavirus 2 by RT PCR NEGATIVE NEGATIVE   Influenza A by PCR NEGATIVE NEGATIVE   Influenza B by PCR NEGATIVE NEGATIVE  ABO/Rh     Status: None   Collection Time: 12/08/20  1:43 AM  Result Value Ref Range   ABO/RH(D)      O POS Performed at [facility], [address] [place], [place]    Group B Strep negative  Assessment   G1P0000 at [redacted]w[redacted]d Estimated Date of Delivery: 12/29/20  The fetus is reassuring.   Patient Active Problem List   Diagnosis Date Noted  . Labor and delivery, indication for care 12/08/2020  . Anemia in pregnancy 10/12/2020  . Rubella non-immune status, antepartum 05/12/2020  . Anxiety 10/26/2019  . GERD (gastroesophageal reflux disease) 10/26/2019  . Chronic hypertension 10/26/2019  . Allergic urticaria history of and currently resolved   09/21/2019  . Migraines 01/30/2017    Plan  1. Admitted to L&D :   2. EFM:-- Category 1 3. Stadol or Epidural if desired.   4. Admission labs completd 5. Cooks catheter placed 6 Continue misoprostol  7.anticipate NSVD  03/30/2017, CNM 12/08/2020 8:05 AM

## 2020-12-08 NOTE — Progress Notes (Signed)
LABOR NOTE   Dana Hood 24 y.o.@ at [redacted]w[redacted]d  SUBJECTIVE:  Rectal pressure, urge to push Analgesia: Epidural  OBJECTIVE:  BP (!) 143/99   Pulse 75   Temp 98.1 F (36.7 C) (Oral)   Resp 18   Ht 5\' 1"  (1.549 m)   Wt 71.7 kg   LMP 03/22/2020 (Exact Date)   SpO2 100%   BMI 29.85 kg/m  No intake/output data recorded.  She has shown cervical change. CERVIX: 9.5 cm:  100%:   0:   not felt:   SVE:   Dilation: Lip/rim Effacement (%): 80 Station: -1 Exam by:: A. Briceida Rasberry CONTRACTIONS: regular, every 1-3 minutes FHR: Fetal heart tracing reviewed. Baseline: 115 bpm, Variability: Good {> 6 bpm), Accelerations: Non-reactive but appropriate for gestational age and Decelerations: repeative variable decels  Category II    Labs: Lab Results  Component Value Date   WBC 11.4 (H) 12/08/2020   HGB 9.2 (L) 12/08/2020   HCT 29.1 (L) 12/08/2020   MCV 82.7 12/08/2020   PLT 284 12/08/2020    ASSESSMENT: 1) Labor curve reviewed.       Progress: Active phase labor.     Membranes: ruptured            Active Problems:   Labor and delivery, indication for care Chronic hypertension  PLAN: IV fluid bolus, change maternal position and pitocin off Dr. 12/10/2020 notified. Start pushing.   Valentino Saxon, CNM  12/08/2020 2:22 PM

## 2020-12-09 LAB — CBC
HCT: 28.8 % — ABNORMAL LOW (ref 36.0–46.0)
Hemoglobin: 9.1 g/dL — ABNORMAL LOW (ref 12.0–15.0)
MCH: 26.3 pg (ref 26.0–34.0)
MCHC: 31.6 g/dL (ref 30.0–36.0)
MCV: 83.2 fL (ref 80.0–100.0)
Platelets: 237 10*3/uL (ref 150–400)
RBC: 3.46 MIL/uL — ABNORMAL LOW (ref 3.87–5.11)
RDW: 13.9 % (ref 11.5–15.5)
WBC: 18.4 10*3/uL — ABNORMAL HIGH (ref 4.0–10.5)
nRBC: 0 % (ref 0.0–0.2)

## 2020-12-09 MED ORDER — DOCUSATE SODIUM 100 MG PO CAPS: 100.0000 mg | ORAL_CAPSULE | Freq: Two times a day (BID) | ORAL | 0 refills | Status: DC

## 2020-12-09 MED ORDER — IBUPROFEN 600 MG PO TABS
600.0000 mg | ORAL_TABLET | Freq: Four times a day (QID) | ORAL | Status: DC
  Administered 2020-12-09 (×3): 600 mg via ORAL
  Filled 2020-12-09 (×4): qty 1

## 2020-12-09 NOTE — Final Progress Note (Signed)
Discharge Day SOAP Note:  Progress Note - Vaginal Delivery  Dana Hood is a 24 y.o. G1P1001 now PP day 1 s/p Vaginal, Spontaneous . Delivery was uncomplicated  Subjective  The patient has the following complaints: has no unusual complaints  Pain is controlled with current medications.   Patient is urinating without difficulty.  She is ambulating well.     Objective  Vital signs: BP 120/68 (BP Location: Right Arm)   Pulse 81   Temp 97.6 F (36.4 C) (Oral)   Resp 18   Ht 5\' 1"  (1.549 m)   Wt 71.7 kg   LMP 03/22/2020 (Exact Date)   SpO2 99%   Breastfeeding Unknown   BMI 29.85 kg/m   Physical Exam: Gen: NAD Fundus Fundal Tone: Firm  Lochia Amount: Small        Data Review Labs: Lab Results  Component Value Date   WBC 18.4 (H) 12/09/2020   HGB 9.1 (L) 12/09/2020   HCT 28.8 (L) 12/09/2020   MCV 83.2 12/09/2020   PLT 237 12/09/2020   CBC Latest Ref Rng & Units 12/09/2020 12/08/2020 10/09/2020  WBC 4.0 - 10.5 K/uL 18.4(H) 11.4(H) 7.1  Hemoglobin 12.0 - 15.0 g/dL 10/11/2020) 3.6(I) 6.8(E)  Hematocrit 36.0 - 46.0 % 28.8(L) 29.1(L) 29.1(L)  Platelets 150 - 400 K/uL 237 284 251   O POS Performed at [facility], [address] [place], [place]   [address] Score: No flowsheet data found.  Assessment/Plan  Active Problems:   Labor and delivery, indication for care   Chronic hypertension affecting pregnancy    Plan for discharge today.  Discharge Instructions: Per After Visit Summary. Activity: Advance as tolerated. Pelvic rest for 6 weeks.  Also refer to After Visit Summary Diet: Regular Medications: Allergies as of 12/09/2020      Reactions   Alpha-gal    No red meat      Medication List    STOP taking these medications   aspirin EC 81 MG tablet   ondansetron 4 MG disintegrating tablet Commonly known as: Zofran ODT     TAKE these medications   azelastine 0.1 % nasal spray Commonly known as: ASTELIN Place 1 spray  into both nostrils 2 (two) times daily. Use in each nostril as directed   cetirizine 10 MG tablet Commonly known as: ZYRTEC Take 1 tablet (10 mg total) by mouth daily.   docusate sodium 100 MG capsule Commonly known as: COLACE Take 1 capsule (100 mg total) by mouth 2 (two) times daily.   EPINEPHrine 0.3 mg/0.3 mL Soaj injection Commonly known as: Adrenaclick Inject 0.3 mLs (0.3 mg total) into the muscle as needed for anaphylaxis.   labetalol 100 MG tablet Commonly known as: NORMODYNE TAKE 1 TABLET BY MOUTH TWICE A DAY   PRENATAL VITAMINS PO Take 1 tablet by mouth daily.   sertraline 100 MG tablet Commonly known as: ZOLOFT TAKE 1 TABLET BY MOUTH EVERY DAY      Outpatient follow up: 2 week tele visit , 6 wk ppv with 12/11/2020  Postpartum contraception: Will discuss at first office visit post-partum  Discharged Condition: good  Discharged to: home  Newborn Data: Disposition:home with mother  Apgars: APGAR (1 MIN): 9   APGAR (5 MINS): 9   APGAR (10 MINS):    Baby Feeding: Breast  Pattricia Boss, CNM  12/09/2020 11:34 AM

## 2020-12-09 NOTE — Discharge Summary (Signed)
Patient Name: Dana Hood DOB: 1996-01-10 MRN: [id number]                            Discharge Summary  Date of Admission: 12/08/2020 Date of Discharge: 12/09/2020 Delivering Provider: [name]   Admitting Diagnosis: Labor and delivery, indication for care [O75.9] at [redacted]w[redacted]d Secondary diagnosis:  Active Problems:   Labor and delivery, indication for care   Chronic hypertension affecting pregnancy   Mode of Delivery: normal spontaneous vaginal delivery              Discharge diagnosis: Term Pregnancy Delivered      Intrapartum Procedures: epidural and laceration periuretheral   Post partum procedures: none  Complications: none                     Discharge Day SOAP Note:  Progress Note - Vaginal Delivery  Dana Hood is a 24 y.o. G1P1001 now PP day 1 s/p Vaginal, Spontaneous . Delivery was uncomplicated  Subjective  The patient has the following complaints: has no unusual complaints  Pain is controlled with current medications.   Patient is urinating without difficulty.  She is ambulating well.     Objective  Vital signs: BP 120/68 (BP Location: Right Arm)   Pulse 81   Temp 97.6 F (36.4 C) (Oral)   Resp 18   Ht 5\' 1"  (1.549 m)   Wt 71.7 kg   LMP 03/22/2020 (Exact Date)   SpO2 99%   Breastfeeding Unknown   BMI 29.85 kg/m   Physical Exam: Gen: NAD Fundus Fundal Tone: Firm  Lochia Amount: Small        Data Review Labs: Lab Results  Component Value Date   WBC 18.4 (H) 12/09/2020   HGB 9.1 (L) 12/09/2020   HCT 28.8 (L) 12/09/2020   MCV 83.2 12/09/2020   PLT 237 12/09/2020   CBC Latest Ref Rng & Units 12/09/2020 12/08/2020 10/09/2020  WBC 4.0 - 10.5 K/uL 18.4(H) 11.4(H) 7.1  Hemoglobin 12.0 - 15.0 g/dL 10/11/2020) 8.2(U) 2.3(N)  Hematocrit 36.0 - 46.0 % 28.8(L) 29.1(L) 29.1(L)  Platelets 150 - 400 K/uL 237 284 251   O POS Performed at [facility], [address] [place], [place]   [id number]  Score: No flowsheet data found.  Assessment/Plan  Active Problems:   Labor and delivery, indication for care   Chronic hypertension affecting pregnancy    Plan for discharge today.  Discharge Instructions: Per After Visit Summary. Activity: Advance as tolerated. Pelvic rest for 6 weeks.  Also refer to After Visit Summary Diet: Regular Medications: Allergies as of 12/09/2020      Reactions   Alpha-gal    No red meat      Medication List    STOP taking these medications   aspirin EC 81 MG tablet   ondansetron 4 MG disintegrating tablet Commonly known as: Zofran ODT     TAKE these medications   azelastine 0.1 % nasal spray Commonly known as: ASTELIN Place 1 spray into both nostrils 2 (two) times daily. Use in each nostril as directed   cetirizine 10 MG tablet Commonly known as: ZYRTEC Take 1 tablet (10 mg total) by mouth daily.   docusate sodium 100 MG capsule Commonly known as: COLACE Take 1 capsule (100 mg total) by mouth 2 (two) times daily.   EPINEPHrine 0.3 mg/0.3 mL Soaj injection Commonly known as: Adrenaclick Inject  0.3 mLs (0.3 mg total) into the muscle as needed for anaphylaxis.   labetalol 100 MG tablet Commonly known as: NORMODYNE TAKE 1 TABLET BY MOUTH TWICE A DAY   PRENATAL VITAMINS PO Take 1 tablet by mouth daily.   sertraline 100 MG tablet Commonly known as: ZOLOFT TAKE 1 TABLET BY MOUTH EVERY DAY      Outpatient follow up: 2 week tele visit , 6 wk ppv with Pattricia Boss  Postpartum contraception: Will discuss at first office visit post-partum  Discharged Condition: good  Discharged to: home  Newborn Data: Disposition:home with mother  Apgars: APGAR (1 MIN): 9   APGAR (5 MINS): 9   APGAR (10 MINS):    Baby Feeding: Breast  [name], CNM  12/09/2020 11:34 AM

## 2020-12-09 NOTE — Progress Notes (Signed)
Pt given discharge instructions. No needs or concerns expressed. Pt has no iv. Refused rubella vaccine. Mother rooming in with baby

## 2020-12-09 NOTE — Lactation Note (Addendum)
This note was copied from a baby's chart. Lactation Consultation Note  Patient Name: Dana Hood [id number] Date: 12/09/2020 Reason for consult: Follow-up assessment;Mother's request;Difficult latch;Primapara;Early term 37-38.6wks;Other (Comment) (Sleepy)  Assisted mom with breast feeding with [name] in football hold skin to skin on right breast.  He has been sleepy since circumcision.  Breast fed for 10 minutes without nipple shield continually flipping out lower lip for better latch and hand expressing dripping into his mouth and stimulating to keep awake.  If did not massage breast, he would stop sucking.  Sustained the latch and sucked more vigorously with #20 nipple shield for another 10 minutes massaging breasts to keep nipple shield full of colostrum.  Hand expressed after breast feed into cup and demonstrated how to give 2 ml via Tb syringe at the breast, finger feeding and spoon feeding. Discussed feeding cues and encouraged mom to put [name] to the breast whenever he demonstrated hunger cues.  Encouraged mom to pump if poor or no feeding and feed via alternative methods other than bottle if possible.  Mom agreed to pump after she ate her food that was just delivered.  Mom had cramping with breast feeding and pumping.  Symphony pump set up with instructions in breast massage, hand expression, pumping, collection, storage, cleaning, labeling and handling of expressed milk.  Coconut oil given to put on flanges to help get better seal and for comfort.  Shells given to help evert nipples with instructions in use when mom gets bra on.  Dr. Princess Bruins encouraged mom to stay instead of being discharged.  They were transferred to 339 and will not go home tonight. Hand out given on what to expect with feedings the first 4 days of life reviewing normal newborn stomach size, supply and demand, adequate intake and out put, normal course of lactation and routine newborn feeding patterns.  Lactation [employer] and [facility] hand out given with contact numbers, informative web sites and support groups and reviewed.  Lactation name and number written on white board and encouraged to call with any questions, concerns or assistance. Maternal Data Formula Feeding for Exclusion: No Has patient been taught Hand Expression?: Yes Does the patient have breastfeeding experience prior to this delivery?: No (P1)  Feeding Feeding Type: Breast Fed  LATCH Score Latch: Repeated attempts needed to sustain latch, nipple held in mouth throughout feeding, stimulation needed to elicit sucking reflex.  Audible Swallowing: A few with stimulation  Type of Nipple: Everted at rest and after stimulation  Comfort (Breast/Nipple): Filling, red/small blisters or bruises, mild/mod discomfort  Hold (Positioning): Assistance needed to correctly position infant at breast and maintain latch.  LATCH Score: 6  Interventions Interventions: Hand express;Skin to skin;Assisted with latch  Lactation Tools Discussed/Used Tools: Coconut oil;Nipple Dorris Carnes;Other (comment) (spoon) Nipple shield size: 20 WIC Program: No [employer]) Pump Review: Milk Storage;Other (comment)   Consult Status Consult Status: PRN Follow-up type: Call as needed    [name] 12/09/2020, 4:42 PM

## 2020-12-10 ENCOUNTER — Ambulatory Visit: Payer: Self-pay

## 2020-12-10 NOTE — Lactation Note (Signed)
This note was copied from a baby's chart. Lactation Consultation Note  Patient Name: Dana Hood [id number] Date: 12/10/2020 Reason for consult: Follow-up assessment;Primapara;Early term 37-38.6wks;Hyperbilirubinemia  LC has been assisting mom today with breast feeding with nipple shield and pumping for supplementation. [name] has been placed under phototherapy for a 11.9 trancutaneous and 12.4 serum bilirubin.  Since the phototherapy, he has been more awake with more feeding cues and eager to breastfeed.  He still needs frequent stimulation to keep him actively sucking at the breast.  Mom's nipples are becoming more painful.  No trauma noted to nipples.  Comfort gels were given for discomfort which mom has been using religiously alternating with coconut oil.  Mom has been expressing 15 to 17 ml which she has been supplementing with a bottle with FOB's assistance.  Praised parents for their commitment to continue to supply breast milk for [name].  Encouraged mom to continue to call for any assistance.        Maternal Data    Feeding    LATCH Score                   Interventions    Lactation Tools Discussed/Used     Consult Status      [name] 12/10/2020, 7:19 PM

## 2020-12-11 ENCOUNTER — Ambulatory Visit: Payer: Self-pay

## 2020-12-11 NOTE — Lactation Note (Signed)
This note was copied from a baby's chart. Lactation Consultation Note  Patient Name: Dana Hood XIPJA'[id number] Date: 12/11/2020 Reason for consult: Follow-up assessment;Mother's request;Difficult latch;Primapara;Early term 37-38.6wks;Infant < 6lbs;Hyperbilirubinemia (Dana Hood to be discharged today)  Mom has been mostly pumping and giving expressed breast milk d/t hyperbilirubinemia.  Mom's mature milk is transitioning in.  She is still putting him to the breast, but he is falling asleep.  Mom's nipples are well everted now, but a little sore.  She reports the pump being gentler than Dana Hood.  Mom has a Medela DEBP at home and plans to continue to pump.  Praised mom for her commitment.  Mom has lactation numbers to contact us if she continues to have issues with getting Dana Hood back to the breast after bilirubin issues are resolved.   Maternal Data Formula Feeding for Exclusion: No Has patient been taught Hand Expression?: Yes Does the patient have breastfeeding experience prior to this delivery?: No (P1)  Feeding Feeding Type: Bottle Fed - Breast Milk Nipple Type: Slow - flow  LATCH Score                   Interventions Interventions: Support pillows;Expressed milk;Coconut oil;Comfort gels  Lactation Tools Discussed/Used Tools: Shells;Coconut oil;Comfort gels;Pump Nipple shield size: 20 Shell Type: Inverted Breast pump type: Manual WIC Program: No [employer]) Pump Review: Setup, frequency, and cleaning;Milk Storage;Other (comment) Initiated by:: S.Quint Chestnut,RNC,BSN,IBCLC Date initiated:: 12/09/20   Consult Status Consult Status: PRN Follow-up type: Call as needed    Dana Hood 12/11/2020, 7:59 PM

## 2020-12-11 NOTE — Anesthesia Postprocedure Evaluation (Signed)
Anesthesia Post Note  Patient: Dana Hood  Procedure(s) Performed: AN AD HOC LABOR EPIDURAL  Patient location during evaluation: Mother Baby Anesthesia Type: Epidural Level of consciousness: awake and alert and oriented Pain management: pain level controlled Vital Signs Assessment: post-procedure vital signs reviewed and stable Respiratory status: spontaneous breathing Cardiovascular status: blood pressure returned to baseline Postop Assessment: able to ambulate Anesthetic complications: no Comments: Patient discharged prior to visit.  No apparent anesthetic issues according to staff.   No complications documented.   Last Vitals: There were no vitals filed for this visit.  Last Pain: There were no vitals filed for this visit.               Mahek Schlesinger

## 2020-12-17 ENCOUNTER — Other Ambulatory Visit: Payer: Self-pay | Admitting: Internal Medicine

## 2020-12-25 ENCOUNTER — Ambulatory Visit (INDEPENDENT_AMBULATORY_CARE_PROVIDER_SITE_OTHER): Payer: Managed Care, Other (non HMO) | Admitting: Certified Nurse Midwife

## 2020-12-25 ENCOUNTER — Other Ambulatory Visit: Payer: Self-pay

## 2020-12-25 DIAGNOSIS — Z1331 Encounter for screening for depression: Secondary | ICD-10-CM

## 2020-12-25 NOTE — Progress Notes (Signed)
Virtual Visit via Telephone Note  I connected with Dana Hood on 12/25/20 at 11:30 AM EST by telephone and verified that I am speaking with the correct person using two identifiers.  Location: Patient: at home Provider: at office    I discussed the limitations, risks, security and privacy concerns of performing an evaluation and management service by telephone and the availability of in person appointments. I also discussed with the patient that there may be a patient responsible charge related to this service. The patient expressed understanding and agreed to proceed.   History of Present Illness: SVD 12/08/20 @ 37 wks   Observations/Objective:  Doing well, she is pumping and feeding breast milk,denies any problems with feeding.She and her partner are adjusting well. She denies any concerns with her mood.   GAD 7 : Generalized Anxiety Score 12/25/2020 11/23/2020 10/23/2020 10/09/2020  Nervous, Anxious, on Edge 0 0 0 0  Control/stop worrying 0 1 1 1   Worry too much - different things 1 0 1 0  Trouble relaxing 0 1 1 1   Restless 0 0 1 0  Easily annoyed or irritable 0 1 0 1  Afraid - awful might happen 0 0 0 0  Total GAD 7 Score 1 3 4 3   Anxiety Difficulty Not difficult at all Not difficult at all Not difficult at all -   [facility] SCORE ONLY 12/25/2020 11/23/2020 10/23/2020  PHQ-9 Total Score 1 2 3      Assessment and Plan: Follow up in 4 wks at the office for her PPV or sooner if needed.     I discussed the assessment and treatment plan with the patient. The patient was provided an opportunity to ask questions and all were answered. The patient agreed with the plan and demonstrated an understanding of the instructions.   The patient was advised to call back or seek an in-person evaluation if the symptoms worsen or if the condition fails to improve as anticipated.  I provided 6 minutes of non-face-to-face time during this encounter.   02/22/2021, CNM

## 2021-01-22 ENCOUNTER — Ambulatory Visit (INDEPENDENT_AMBULATORY_CARE_PROVIDER_SITE_OTHER): Payer: Managed Care, Other (non HMO) | Admitting: Certified Nurse Midwife

## 2021-01-22 ENCOUNTER — Other Ambulatory Visit: Payer: Self-pay

## 2021-01-22 ENCOUNTER — Encounter: Payer: Self-pay | Admitting: Certified Nurse Midwife

## 2021-01-22 MED ORDER — ETONOGESTREL-ETHINYL ESTRADIOL 0.12-0.015 MG/24HR VA RING: VAGINAL_RING | VAGINAL | 12 refills | Status: DC

## 2021-01-22 NOTE — Patient Instructions (Signed)
Preventive Care 21-26 Years Old, Female Preventive care refers to lifestyle choices and visits with your health care provider that can promote health and wellness. This includes:  A yearly physical exam. This is also called an annual wellness visit.  Regular dental and eye exams.  Immunizations.  Screening for certain conditions.  Healthy lifestyle choices, such as: ? Eating a healthy diet. ? Getting regular exercise. ? Not using drugs or products that contain nicotine and tobacco. ? Limiting alcohol use. What can I expect for my preventive care visit? Physical exam Your health care provider may check your:  Height and weight. These may be used to calculate your BMI (body mass index). BMI is a measurement that tells if you are at a healthy weight.  Heart rate and blood pressure.  Body temperature.  Skin for abnormal spots. Counseling Your health care provider may ask you questions about your:  Past medical problems.  Family's medical history.  Alcohol, tobacco, and drug use.  Emotional well-being.  Home life and relationship well-being.  Sexual activity.  Diet, exercise, and sleep habits.  Work and work environment.  Access to firearms.  Method of birth control.  Menstrual cycle.  Pregnancy history. What immunizations do I need? Vaccines are usually given at various ages, according to a schedule. Your health care provider will recommend vaccines for you based on your age, medical history, and lifestyle or other factors, such as travel or where you work.   What tests do I need? Blood tests  Lipid and cholesterol levels. These may be checked every 5 years starting at age 20.  Hepatitis C test.  Hepatitis B test. Screening  Diabetes screening. This is done by checking your blood sugar (glucose) after you have not eaten for a while (fasting).  STD (sexually transmitted disease) testing, if you are at risk.  BRCA-related cancer screening. This may be  done if you have a family history of breast, ovarian, tubal, or peritoneal cancers.  Pelvic exam and Pap test. This may be done every 3 years starting at age 21. Starting at age 30, this may be done every 5 years if you have a Pap test in combination with an HPV test. Talk with your health care provider about your test results, treatment options, and if necessary, the need for more tests.   Follow these instructions at home: Eating and drinking  Eat a healthy diet that includes fresh fruits and vegetables, whole grains, lean protein, and low-fat dairy products.  Take vitamin and mineral supplements as recommended by your health care provider.  Do not drink alcohol if: ? Your health care provider tells you not to drink. ? You are pregnant, may be pregnant, or are planning to become pregnant.  If you drink alcohol: ? Limit how much you have to 0-1 drink a day. ? Be aware of how much alcohol is in your drink. In the [place] one drink equals one 12 oz bottle of beer (355 mL), one 5 oz glass of wine (148 mL), or one 1 oz glass of hard liquor (44 mL).   Lifestyle  Take daily care of your teeth and gums. Brush your teeth every morning and night with fluoride toothpaste. Floss one time each day.  Stay active. Exercise for at least 30 minutes 5 or more days each week.  Do not use any products that contain nicotine or tobacco, such as cigarettes, e-cigarettes, and chewing tobacco. If you need help quitting, ask your health care provider.  Do not   use drugs.  If you are sexually active, practice safe sex. Use a condom or other form of protection to prevent STIs (sexually transmitted infections).  If you do not wish to become pregnant, use a form of birth control. If you plan to become pregnant, see your health care provider for a prepregnancy visit.  Find healthy ways to cope with stress, such as: ? Meditation, yoga, or listening to music. ? Journaling. ? Talking to a trusted  person. ? Spending time with friends and family. Safety  Always wear your seat belt while driving or riding in a vehicle.  Do not drive: ? If you have been drinking alcohol. Do not ride with someone who has been drinking. ? When you are tired or distracted. ? While texting.  Wear a helmet and other protective equipment during sports activities.  If you have firearms in your house, make sure you follow all gun safety procedures.  Seek help if you have been physically or sexually abused. What's next?  Go to your health care provider once a year for an annual wellness visit.  Ask your health care provider how often you should have your eyes and teeth checked.  Stay up to date on all vaccines. This information is not intended to replace advice given to you by your health care provider. Make sure you discuss any questions you have with your health care provider. Document Revised: 08/06/2020 Document Reviewed: 08/20/2018 Elsevier Patient Education  2021 Elsevier Inc.  

## 2021-01-22 NOTE — Progress Notes (Signed)
Subjective:    Dana Hood is a 25 y.o. G37P1001 Caucasian female who presents for a postpartum visit. She is 6 weeks postpartum following a spontaneous vaginal delivery at 37 gestational weeks. Anesthesia: epidural. I have fully reviewed the prenatal and intrapartum course. Postpartum course has been wnl. Baby's course has been wnl. Baby is feeding by breast. Bleeding no bleeding. Bowel function is normal. Bladder function is normal. Patient is not sexually active. Contraception method is NuvaRing vaginal inserts. Postpartum depression screening: negative. Score 2.  Last pap 04/13/20 and was negative.  The following portions of the patient's history were reviewed and updated as appropriate: allergies, current medications, past medical history, past surgical history and problem list.  Review of Systems Pertinent items are noted in HPI.   Vitals:   01/22/21 1134  BP: (!) 118/48  Pulse: 67  Weight: 135 lb 3 oz (61.3 kg)  Height: 5\' 1"  (1.549 m)   No LMP recorded.  Objective:   General:  alert, cooperative and no distress   Breasts:  deferred, no complaints  Lungs: clear to auscultation bilaterally  Heart:  regular rate and rhythm  Abdomen: soft, nontender   Vulva: normal  Vagina: normal vagina  Cervix:  closed  Corpus: Well-involuted  Adnexa:  Non-palpable  Rectal Exam: no hemorrhoids        Assessment:   Postpartum exam 6 wks s/p SVD Breast feeding  Depression screening Contraception counseling   Plan:  : NuvaRing vaginal inserts, discussed possible decrease in milk supply, she verbalizes understanding and would like to try the nuva ring.  Follow up in: 9 months for annual or earlier if needed  , CNM

## 2021-02-22 ENCOUNTER — Telehealth: Payer: Self-pay | Admitting: Certified Nurse Midwife

## 2021-02-22 NOTE — Telephone Encounter (Signed)
Telephone call per patient request, see MyChart.   Patient and spouse made decision for her to stay at home with baby, not returning to work at this time.   Patient needs letter for insurance regarding ER visit on 12/10/2019, sent via MyChart.    Serafina Royals, CNM [facility], [facility] 02/22/21 9:52 AM

## 2021-03-24 ENCOUNTER — Other Ambulatory Visit: Payer: Self-pay | Admitting: Certified Nurse Midwife

## 2021-03-28 ENCOUNTER — Other Ambulatory Visit: Payer: Self-pay | Admitting: Internal Medicine

## 2021-04-16 ENCOUNTER — Encounter: Payer: Managed Care, Other (non HMO) | Admitting: Certified Nurse Midwife

## 2021-05-23 ENCOUNTER — Other Ambulatory Visit: Payer: Self-pay | Admitting: Internal Medicine

## 2021-06-12 ENCOUNTER — Other Ambulatory Visit: Payer: Self-pay | Admitting: Internal Medicine

## 2021-10-22 ENCOUNTER — Encounter: Payer: Managed Care, Other (non HMO) | Admitting: Certified Nurse Midwife

## 2022-01-03 ENCOUNTER — Encounter: Payer: Self-pay | Admitting: Internal Medicine

## 2022-01-03 NOTE — Patient Instructions (Signed)
  Blood work was ordered.     Medications changes include :     Your prescription(s) have been submitted to your pharmacy. Please take as directed and contact our office if you believe you are having problem(s) with the medication(s).   A referral was ordered for        Someone from their office will call you to schedule an appointment.    Please followup in 6 months  

## 2022-01-03 NOTE — Progress Notes (Signed)
Subjective:    Patient ID: Dana Hood, female    DOB: [birth year], 26 y.o.   MRN: [id number]  This visit occurred during the SARS-CoV-2 public health emergency.  Safety protocols were in place, including screening questions prior to the visit, additional usage of staff PPE, and extensive cleaning of exam room while observing appropriate contact time as indicated for disinfecting solutions.     HPI The patient is here for follow up of their chronic medical problems, including anxiety, htn    Medications and allergies reviewed with patient and updated if appropriate.  Patient Active Problem List   Diagnosis Date Noted   Anxiety 10/26/2019   GERD (gastroesophageal reflux disease) 10/26/2019   Chronic hypertension 10/26/2019   Allergic urticaria history of and currently resolved  09/21/2019   Migraines 01/30/2017    Current Outpatient Medications on File Prior to Visit  Medication Sig Dispense Refill   azelastine (ASTELIN) 0.1 % nasal spray Place 1 spray into both nostrils 2 (two) times daily. Use in each nostril as directed 30 mL 12   cetirizine (ZYRTEC) 10 MG tablet Take 1 tablet (10 mg total) by mouth daily. 90 tablet 1   EPINEPHrine (ADRENACLICK) 0.3 mg/0.3 mL IJ SOAJ injection Inject 0.3 mLs (0.3 mg total) into the muscle as needed for anaphylaxis. 1 each 1   etonogestrel-ethinyl estradiol (NUVARING) 0.12-0.015 MG/24HR vaginal ring Insert vaginally and leave in place for 3 consecutive weeks, then remove for 1 week. 1 each 12   No current facility-administered medications on file prior to visit.    Past Medical History:  Diagnosis Date   Allergy to alpha-gal    Anxiety    Atrial fibrillation with tachycardic ventricular rate (HCC)    Hypertension     Past Surgical History:  Procedure Laterality Date   NO PAST SURGERIES      Social History   Socioeconomic History   Marital status: Married    Spouse name: Not on file   Number of children: Not on file    Years of education: Not on file   Highest education level: Not on file  Occupational History   Not on file  Tobacco Use   Smoking status: Former    Packs/day: 0.25    Years: 5.00    Pack years: 1.25    Types: Cigarettes   Smokeless tobacco: Never   Tobacco comments:    3-4 cig/day  Vaping Use   Vaping Use: Never used  Substance and Sexual Activity   Alcohol use: Not Currently    Comment: social   Drug use: Never   Sexual activity: Yes    Partners: Male    Birth control/protection: None  Other Topics Concern   Not on file  Social History Narrative   Not on file   Social Determinants of Health   Financial Resource Strain: Not on file  Food Insecurity: Not on file  Transportation Needs: Not on file  Physical Activity: Not on file  Stress: Not on file  Social Connections: Not on file    Family History  Problem Relation Age of Onset   Hyperlipidemia Mother    Hypertension Mother    Migraines Mother    Hypertension Sister    Hyperlipidemia Sister    Heart failure Maternal Grandfather    Rheum arthritis Maternal Uncle    Breast cancer Neg Hx    Ovarian cancer Neg Hx    Colon cancer Neg Hx     Review of  Systems     Objective:  There were no vitals filed for this visit. BP Readings from Last 3 Encounters:  01/28/22 120/76  01/22/21 (!) 118/48  12/09/20 125/73   Wt Readings from Last 3 Encounters:  01/28/22 130 lb (59 kg)  01/22/21 135 lb 3 oz (61.3 kg)  12/08/20 158 lb (71.7 kg)   There is no height or weight on file to calculate BMI.   Physical Exam    Constitutional: Appears well-developed and well-nourished. No distress.  HENT:  Head: Normocephalic and atraumatic.  Neck: Neck supple. No tracheal deviation present. No thyromegaly present.  No cervical lymphadenopathy Cardiovascular: Normal rate, regular rhythm and normal heart sounds.   No murmur heard. No carotid bruit .  No edema Pulmonary/Chest: Effort normal and breath sounds normal. No  respiratory distress. No has no wheezes. No rales.  Skin: Skin is warm and dry. Not diaphoretic.  Psychiatric: Normal mood and affect. Behavior is normal.      Assessment & Plan:    See Problem List for Assessment and Plan of chronic medical problems.    This encounter was created in error - please disregard.

## 2022-01-04 ENCOUNTER — Encounter: Payer: Managed Care, Other (non HMO) | Admitting: Internal Medicine

## 2022-01-04 DIAGNOSIS — I10 Essential (primary) hypertension: Secondary | ICD-10-CM

## 2022-01-04 DIAGNOSIS — F419 Anxiety disorder, unspecified: Secondary | ICD-10-CM

## 2022-01-27 NOTE — Progress Notes (Signed)
Subjective:    Patient ID: Dana Hood, female    DOB: March 16, 1996, 26 y.o.   MRN: [id number]  This visit occurred during the SARS-CoV-2 public health emergency.  Safety protocols were in place, including screening questions prior to the visit, additional usage of staff PPE, and extensive cleaning of exam room while observing appropriate contact time as indicated for disinfecting solutions.     HPI The patient is here for follow up of their chronic medical problems, including anxiety, htn  BP at home last week - 140/90's.  She does not check it that much.  She is not currently taking her BP medication but does want to get back on it.  She knows she needs it and can feel her BP elevate at times.      Medications and allergies reviewed with patient and updated if appropriate.  Patient Active Problem List   Diagnosis Date Noted   Anxiety 10/26/2019   GERD (gastroesophageal reflux disease) 10/26/2019   Chronic hypertension 10/26/2019   Allergic urticaria history of and currently resolved  09/21/2019   Migraines 01/30/2017    Current Outpatient Medications on File Prior to Visit  Medication Sig Dispense Refill   azelastine (ASTELIN) 0.1 % nasal spray Place 1 spray into both nostrils 2 (two) times daily. Use in each nostril as directed 30 mL 12   cetirizine (ZYRTEC) 10 MG tablet Take 1 tablet (10 mg total) by mouth daily. 90 tablet 1   EPINEPHrine (ADRENACLICK) 0.3 mg/0.3 mL IJ SOAJ injection Inject 0.3 mLs (0.3 mg total) into the muscle as needed for anaphylaxis. 1 each 1   etonogestrel-ethinyl estradiol (NUVARING) 0.12-0.015 MG/24HR vaginal ring Insert vaginally and leave in place for 3 consecutive weeks, then remove for 1 week. 1 each 12   labetalol (NORMODYNE) 100 MG tablet Take 1 tablet (100 mg total) by mouth 2 (two) times daily. Follow up appt needed 60 tablet 0   sertraline (ZOLOFT) 100 MG tablet TAKE 1 TABLET BY MOUTH EVERY DAY 90 tablet 1   No current  facility-administered medications on file prior to visit.    Past Medical History:  Diagnosis Date   Allergy to alpha-gal    Anxiety    Atrial fibrillation with tachycardic ventricular rate (HCC)    Hypertension     Past Surgical History:  Procedure Laterality Date   NO PAST SURGERIES      Social History   Socioeconomic History   Marital status: Married    Spouse name: Not on file   Number of children: Not on file   Years of education: Not on file   Highest education level: Not on file  Occupational History   Not on file  Tobacco Use   Smoking status: Former    Packs/day: 0.25    Years: 5.00    Pack years: 1.25    Types: Cigarettes   Smokeless tobacco: Never   Tobacco comments:    3-4 cig/day  Vaping Use   Vaping Use: Never used  Substance and Sexual Activity   Alcohol use: Not Currently    Comment: social   Drug use: Never   Sexual activity: Yes    Partners: Male    Birth control/protection: None  Other Topics Concern   Not on file  Social History Narrative   Not on file   Social Determinants of Health   Financial Resource Strain: Not on file  Food Insecurity: Not on file  Transportation Needs: Not on file  Physical Activity: Not on file  Stress: Not on file  Social Connections: Not on file    Family History  Problem Relation Age of Onset   Hyperlipidemia Mother    Hypertension Mother    Migraines Mother    Hypertension Sister    Hyperlipidemia Sister    Heart failure Maternal Grandfather    Rheum arthritis Maternal Uncle    Breast cancer Neg Hx    Ovarian cancer Neg Hx    Colon cancer Neg Hx     Review of Systems  Constitutional:  Negative for fever.  Respiratory:  Negative for cough, shortness of breath and wheezing.   Cardiovascular:  Negative for chest pain, palpitations and leg swelling.  Neurological:  Negative for dizziness, light-headedness and headaches.      Objective:   Vitals:   01/28/22 0845  BP: 120/76  Pulse: 81   Temp: 98.6 F (37 C)  SpO2: 98%   BP Readings from Last 3 Encounters:  01/28/22 120/76  01/22/21 (!) 118/48  12/09/20 125/73   Wt Readings from Last 3 Encounters:  01/28/22 130 lb (59 kg)  01/22/21 135 lb 3 oz (61.3 kg)  12/08/20 158 lb (71.7 kg)   Body mass index is 24.56 kg/m.   Physical Exam    Constitutional: Appears well-developed and well-nourished. No distress.  HENT:  Head: Normocephalic and atraumatic.  Neck: Neck supple. No tracheal deviation present. No thyromegaly present.  No cervical lymphadenopathy Cardiovascular: Normal rate, regular rhythm and normal heart sounds.   No murmur heard. No carotid bruit .  No edema Pulmonary/Chest: Effort normal and breath sounds normal. No respiratory distress. No has no wheezes. No rales.  Skin: Skin is warm and dry. Not diaphoretic.  Psychiatric: Normal mood and affect. Behavior is normal.      Assessment & Plan:    See Problem List for Assessment and Plan of chronic medical problems.

## 2022-01-27 NOTE — Patient Instructions (Addendum)
° ° ° °  Medications changes include :   restart labetalol   Your prescription(s) have been submitted to your pharmacy. Please take as directed and contact our office if you believe you are having problem(s) with the medication(s).

## 2022-01-28 ENCOUNTER — Other Ambulatory Visit: Payer: Self-pay

## 2022-01-28 ENCOUNTER — Ambulatory Visit (INDEPENDENT_AMBULATORY_CARE_PROVIDER_SITE_OTHER): Payer: No Typology Code available for payment source | Admitting: Internal Medicine

## 2022-01-28 ENCOUNTER — Encounter: Payer: Self-pay | Admitting: Internal Medicine

## 2022-01-28 VITALS — BP 120/76 | HR 81 | Temp 98.6°F | Ht 61.0 in | Wt 130.0 lb

## 2022-01-28 DIAGNOSIS — I10 Essential (primary) hypertension: Secondary | ICD-10-CM

## 2022-01-28 DIAGNOSIS — F419 Anxiety disorder, unspecified: Secondary | ICD-10-CM

## 2022-01-28 MED ORDER — SERTRALINE HCL 100 MG PO TABS: 100.0000 mg | ORAL_TABLET | Freq: Every day | ORAL | 2 refills | Status: DC

## 2022-01-28 MED ORDER — LABETALOL HCL 100 MG PO TABS
100.0000 mg | ORAL_TABLET | Freq: Two times a day (BID) | ORAL | 2 refills | Status: DC
Start: 2022-01-28 — End: 2023-03-14

## 2022-01-28 NOTE — Assessment & Plan Note (Addendum)
Chronic BP well controlled here today but has been high at times at home Not currently taking her medication - needs a refill Restart labetalol 100 mg bid

## 2022-01-28 NOTE — Assessment & Plan Note (Addendum)
Chronic Controlled, stable Managed by Gyn Continue sertraline 100 mg bid

## 2022-02-24 ENCOUNTER — Encounter: Payer: Self-pay | Admitting: Emergency Medicine

## 2022-02-24 ENCOUNTER — Other Ambulatory Visit: Payer: Self-pay

## 2022-02-24 ENCOUNTER — Ambulatory Visit
Admission: EM | Admit: 2022-02-24 | Discharge: 2022-02-24 | Disposition: A | Discharge status: Home / Self Care | Payer: No Typology Code available for payment source | Attending: Family Medicine | Admitting: Family Medicine

## 2022-02-24 DIAGNOSIS — J029 Acute pharyngitis, unspecified: Secondary | ICD-10-CM | POA: Insufficient documentation

## 2022-02-24 DIAGNOSIS — R0982 Postnasal drip: Secondary | ICD-10-CM | POA: Diagnosis not present

## 2022-02-24 LAB — POCT RAPID STREP A (OFFICE): Rapid Strep A Screen: NEGATIVE

## 2022-02-24 MED ORDER — IPRATROPIUM BROMIDE 0.03 % NA SOLN: 2.0000 | Freq: Three times a day (TID) | NASAL | 0 refills | Status: DC | PRN

## 2022-02-24 NOTE — ED Provider Notes (Signed)
?[facility] ? ? ? ?CSN: [id number] ?Arrival date & time: 02/24/22  [id number] ? ? ?  ? ?History   ?Chief Complaint ?Chief Complaint  ?Patient presents with  ? Sore Throat  ? ? ?HPI ?Dana Hood is a 26 y.o. female.  ? ?HPI ?Patient presents for evaluation of sore throat , low grade fever, chills and body aches x 1 day.  No known sick contacts. Patient is afebrile.  ?She is pregnant and therefore unable to take many OTC medication. ? ?Past Medical History:  ?Diagnosis Date  ? Allergy to alpha-gal   ? Anxiety   ? Atrial fibrillation with tachycardic ventricular rate ([place])   ? Hypertension   ? ? ?Patient Active Problem List  ? Diagnosis Date Noted  ? Anxiety 10/26/2019  ? GERD (gastroesophageal reflux disease) 10/26/2019  ? Chronic hypertension 10/26/2019  ? Allergic urticaria history of and currently resolved  09/21/2019  ? Migraines 01/30/2017  ? ? ?Past Surgical History:  ?Procedure Laterality Date  ? NO PAST SURGERIES    ? ? ?OB History   ? ? Gravida  ?1  ? Para  ?1  ? Term  ?1  ? Preterm  ?0  ? AB  ?0  ? Living  ?1  ?  ? ? SAB  ?0  ? IAB  ?0  ? Ectopic  ?0  ? Multiple  ?0  ? Live Births  ?1  ?   ?  ?  ? ? ? ?Home Medications   ? ?Prior to Admission medications   ?Medication Sig Start Date End Date Taking? Authorizing Provider  ?azelastine (ASTELIN) 0.1 % nasal spray Place 1 spray into both nostrils 2 (two) times daily. Use in each nostril as directed 05/09/20   Valentina Shaggy, MD  ?cetirizine (ZYRTEC) 10 MG tablet Take 1 tablet (10 mg total) by mouth daily. 07/06/19   Flinchum, Kelby Aline, FNP  ?EPINEPHrine (ADRENACLICK) 0.3 [id number] mL IJ SOAJ injection Inject 0.3 mLs (0.3 mg total) into the muscle as needed for anaphylaxis. 09/21/19   Flinchum, Kelby Aline, FNP  ?etonogestrel-ethinyl estradiol (NUVARING) 0.12-0.015 MG/24HR vaginal ring Insert vaginally and leave in place for 3 consecutive weeks, then remove for 1 week. 01/22/21   Philip Aspen, CNM  ?labetalol (NORMODYNE) 100 MG tablet Take 1  tablet (100 mg total) by mouth 2 (two) times daily. Follow up appt needed 01/28/22   Binnie Rail, MD  ?sertraline (ZOLOFT) 100 MG tablet Take 1 tablet (100 mg total) by mouth daily. 01/28/22   Binnie Rail, MD  ? ? ?Family History ?Family History  ?Problem Relation Age of Onset  ? Hyperlipidemia Mother   ? Hypertension Mother   ? Migraines Mother   ? Hypertension Sister   ? Hyperlipidemia Sister   ? Heart failure Maternal Grandfather   ? Rheum arthritis Maternal Uncle   ? Breast cancer Neg Hx   ? Ovarian cancer Neg Hx   ? Colon cancer Neg Hx   ? ? ?Social History ?Social History  ? ?Tobacco Use  ? Smoking status: Every Day  ?  Packs/day: 0.25  ?  Years: 5.00  ?  Pack years: 1.25  ?  Types: Cigarettes  ? Smokeless tobacco: Never  ? Tobacco comments:  ?  3-4 cig/day  ?Vaping Use  ? Vaping Use: Never used  ?Substance Use Topics  ? Alcohol use: Not Currently  ?  Comment: social  ? Drug use: Never  ? ? ? ?Allergies   ?Alpha-gal ? ? ?  Review of Systems ?Review of Systems ?Pertinent negatives listed in HPI  ? ?Physical Exam ?Triage Vital Signs ?ED Triage Vitals  ?Enc Vitals Group  ?   BP 02/24/22 0925 115/77  ?   Pulse Rate 02/24/22 0925 84  ?   Resp 02/24/22 0925 16  ?   Temp 02/24/22 0925 98.8 ?F (37.1 ?C)  ?   Temp Source 02/24/22 0925 Oral  ?   SpO2 02/24/22 0925 98 %  ?   Weight --   ?   Height --   ?   Head Circumference --   ?   Peak Flow --   ?   Pain Score 02/24/22 0927 7  ?   Pain Loc --   ?   Pain Edu? --   ?   Excl. in [place]? --   ? ?No data found. ? ?Updated Vital Signs ?BP 115/77 (BP Location: Left Arm)   Pulse 84   Temp 98.8 ?F (37.1 ?C) (Oral)   Resp 16   LMP 02/10/2022 (Approximate)   SpO2 98%  ? ?Visual Acuity ?Right Eye Distance:   ?Left Eye Distance:   ?Bilateral Distance:   ? ?Right Eye Near:   ?Left Eye Near:    ?Bilateral Near:    ? ?Physical Exam ?General Appearance:    Alert, cooperative, no distress  ?HENT:   ENT exam normal, no neck nodes, rhinorrhea   ?Eyes:    PERRL, conjunctiva/corneas  clear, EOM's intact       ?Lungs:     Clear to auscultation bilaterally, respirations unlabored  ?Heart:    Regular rate and rhythm  ?Neurologic:   Awake, alert, oriented x 3. No apparent focal neurological           defect.   ?  ?  ?  ? ?UC Treatments / Results  ?Labs ?(all labs ordered are listed, but only abnormal results are displayed) ?Labs Reviewed  ?POCT RAPID STREP A (OFFICE)  ? ? ?EKG ? ? ?Radiology ?No results found. ? ?Procedures ?Procedures (including critical care time) ? ?Medications Ordered in UC ?Medications - No data to display ? ?Initial Impression / Assessment and Plan / UC Course  ?I have reviewed the triage vital signs and the nursing notes. ? ?Pertinent labs & imaging results that were available during my care of the patient were reviewed by me and considered in my medical decision making (see chart for details). ? ?  ?Post Nasal Drainage and Sore Throat  ?Atrovent TID PRN ?Symptom management only  ?Final Clinical Impressions(s) / UC Diagnoses  ? ?Final diagnoses:  ?Post-nasal drainage  ?Sore throat  ? ?Discharge Instructions   ?None ?  ? ?ED Prescriptions   ? ? Medication Sig Dispense Auth. Provider  ? ipratropium (ATROVENT) 0.03 % nasal spray Place 2 sprays into both nostrils 3 (three) times daily as needed for rhinitis. 30 mL Scot Jun, FNP  ? ?  ? ?PDMP not reviewed this encounter. ?  ?Scot Jun, FNP ?02/28/22 2028 ? ?

## 2022-02-24 NOTE — ED Triage Notes (Signed)
Pt presents with chills, low grade fever, ST and bodyaches since yesterday.  ?

## 2022-02-27 LAB — CULTURE, GROUP A STREP (THRC)

## 2022-03-16 ENCOUNTER — Other Ambulatory Visit: Payer: Self-pay | Admitting: Family Medicine

## 2022-03-20 ENCOUNTER — Encounter: Payer: Medicaid Other | Admitting: Certified Nurse Midwife

## 2022-03-28 ENCOUNTER — Encounter: Payer: Self-pay | Admitting: Allergy

## 2022-03-28 ENCOUNTER — Ambulatory Visit (INDEPENDENT_AMBULATORY_CARE_PROVIDER_SITE_OTHER): Payer: No Typology Code available for payment source | Admitting: Allergy

## 2022-03-28 VITALS — BP 126/84 | HR 84 | Temp 98.0°F | Resp 16 | Ht 61.0 in | Wt 129.5 lb

## 2022-03-28 DIAGNOSIS — H1013 Acute atopic conjunctivitis, bilateral: Secondary | ICD-10-CM

## 2022-03-28 DIAGNOSIS — H01131 Eczematous dermatitis of right upper eyelid: Secondary | ICD-10-CM | POA: Diagnosis not present

## 2022-03-28 DIAGNOSIS — T7800XD Anaphylactic reaction due to unspecified food, subsequent encounter: Secondary | ICD-10-CM

## 2022-03-28 DIAGNOSIS — J3089 Other allergic rhinitis: Secondary | ICD-10-CM | POA: Diagnosis not present

## 2022-03-28 MED ORDER — RYALTRIS 665-25 MCG/ACT NA SUSP: NASAL | 2 refills | Status: DC

## 2022-03-28 MED ORDER — EPINEPHRINE 0.3 MG/0.3ML IJ SOAJ: 0.3000 mg | INTRAMUSCULAR | 1 refills | Status: DC | PRN

## 2022-03-28 NOTE — Patient Instructions (Addendum)
Allergies ?- will obtain updated allergy testing today to see if you have developed additional allergens besides cat, dog, grass pollens, 1 tree pollen and 1 weed pollen.  ?- change zyrtec to Xyzal 5mg  daily at this time ?- use Ryaltris nasal spray 2 sprays each nostril twice a day for nasal congestion (mometasone) and nasal drainage (olopatadine) control.  This is a maintenance spray. Sample provided.  ?- can use the Atrovent nasal spray 2 sprays 3-4 times a day as needed for nasal drainage not improved with Ryaltris ?- for itchy/watery eyes can use Pataday 1 drop each eye daily as needed ? ?Eczematous dermatitis of eyelid and face ?- keep skin moisturized especially after bathing ?- can use Eucrisa (can refrigerate to decrease burning sensation) or Opzelura twice a day as needed for itchy, red, irritated, dry, scaly/flaky areas of the face or eyelid.  These are both non-steroid options.  If one works the best let know and can send in prescription.  Sample of both provided ? ?Food allergy/Hives ?- continue to avoid red meat in the diet ?- will get updated alpha gal panel today to see if you are losing this sensitivity ?- have access to self-injectable epinephrine (Epipen or AuviQ) 0.3mg  at all times ?- follow emergency action plan in case of allergic reaction ? ?Follow-up in 4-6 months or sooner if needed ? ?

## 2022-03-28 NOTE — Progress Notes (Signed)
? ? ?Follow-up Note ? ?RE: Dana Hood MRN: [id number] DOB: Nov 10, 1996 ?Date of Office Visit: 03/28/2022 ? ? ?History of present illness: ?Dana Hood is a 26 y.o. female presenting today for allergy symptoms.  She was last seen on 05/09/2020 by Dr. Dellis Anes for pregnancy-induced rhinitis.  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   ? ?She has been having a lot of allergy symptoms.  She reports facial itchy, itch eyes and rash of the lid, mouth and throat itch, sneezing, nasal congestion, nasal drainage.  These symptoms have been worse the past week but are year-round.  She did got to UC last month as she thought she has strep but was negative and was told it was her allergies.  ?She takes zyrtec daily for the past 3 years and on worse nights may take benadryl.  The UC did prescribe Atrovent nasal spray that she is using 3 times a day at this time and it is helpful.  Has not tried any eye drops.   ?[place]  ? ?She continues to avoid red meat.  But she has not had any further hive episodes.  She does have an epinephrine device but it is expired at this time. ? ?Review of systems: ?Review of Systems  ?Constitutional: Negative.   ?HENT:    ?     See HPI  ?Eyes:   ?     See HPI  ?Respiratory: Negative.    ?Cardiovascular: Negative.   ?Gastrointestinal: Negative.   ?Musculoskeletal: Negative.   ?Skin:   ?     See HPI  ?Allergic/Immunologic: Negative.   ?Neurological: Negative.    ? ?All other systems negative unless noted above in HPI ? ?Past medical/social/surgical/family history have been reviewed and are unchanged unless specifically indicated below. ? ?No changes ? ?Medication List: ?Current Outpatient Medications  ?Medication Sig Dispense Refill  ? cetirizine (ZYRTEC) 10 MG tablet Take 1 tablet (10 mg total) by mouth daily. 90 tablet 1  ? EPINEPHrine (ADRENACLICK) 0.3 mg/0.3 mL IJ SOAJ injection Inject 0.3 mLs (0.3 mg total) into the muscle as needed  for anaphylaxis. 1 each 1  ? etonogestrel-ethinyl estradiol (NUVARING) 0.12-0.015 MG/24HR vaginal ring Insert vaginally and leave in place for 3 consecutive weeks, then remove for 1 week. 1 each 12  ? ipratropium (ATROVENT) 0.03 % nasal spray Place 2 sprays into both nostrils 3 (three) times daily as needed for rhinitis. 30 mL 0  ? labetalol (NORMODYNE) 100 MG tablet Take 1 tablet (100 mg total) by mouth 2 (two) times daily. Follow up appt needed 180 tablet 2  ? sertraline (ZOLOFT) 100 MG tablet Take 1 tablet (100 mg total) by mouth daily. 90 tablet 2  ? azelastine (ASTELIN) 0.1 % nasal spray Place 1 spray into both nostrils 2 (two) times daily. Use in each nostril as directed (Patient not taking: Reported on 03/28/2022) 30 mL 12  ? ?No current facility-administered medications for this visit.  ?  ? ?Known medication allergies: ?Allergies  ?Allergen Reactions  ? Alpha-Gal   ?  No red meat  ? ? ? ?Physical examination: ?Blood pressure 126/84, pulse 84, temperature 98 ?F (36.7 ?C), resp. rate 16, height 5\' 1"  (1.549 m), weight 129 lb 8 oz (58.7 kg), SpO2 98 %, currently breastfeeding. ? ?General: Alert, interactive, in no acute distress. ?HEENT: PERRLA, TMs pearly gray, turbinates moderately edematous with clear discharge, post-pharynx non erythematous. ?Neck: Supple without lymphadenopathy. ?Lungs: Clear to  auscultation without wheezing, rhonchi or rales. {no increased work of breathing. ?CV: Normal S1, S2 without murmurs. ?Abdomen: Nondistended, nontender. ?Skin: Right upper lid with erythematous patch . ?Extremities:  No clubbing, cyanosis or edema. ?Neuro:   Grossly intact. ? ?Diagnositics/Labs: ?None today ? ?Assessment and plan: ?Allergic rhinitis with conjunctivitis ?- will obtain updated allergy testing today to see if you have developed additional allergens besides cat, dog, grass pollens, 1 tree pollen and 1 weed pollen.  ?- change zyrtec to Xyzal 5mg  daily at this time ?- use Ryaltris nasal spray 2 sprays  each nostril twice a day for nasal congestion (mometasone) and nasal drainage (olopatadine) control.  This is a maintenance spray. Sample provided.  ?- can use the Atrovent nasal spray 2 sprays 3-4 times a day as needed for nasal drainage not improved with Ryaltris ?- for itchy/watery eyes can use Pataday 1 drop each eye daily as needed ? ?Eczematous dermatitis of eyelid and face ?- keep skin moisturized especially after bathing ?- can use Eucrisa (can refrigerate to decrease burning sensation) or Opzelura twice a day as needed for itchy, red, irritated, dry, scaly/flaky areas of the face or eyelid.  These are both non-steroid options.  If one works the best let know and can send in prescription.  Sample of both provided ? ?Food allergy/Hives ?- continue to avoid red meat in the diet ?- will get updated alpha gal panel today to see if you are losing this sensitivity ?- have access to self-injectable epinephrine (Epipen or AuviQ) 0.3mg  at all times ?- follow emergency action plan in case of allergic reaction ? ?Follow-up in 4-6 months or sooner if needed ? ? ?No follow-ups on file. ? ?I appreciate the opportunity to take part in Dana Hood's care. Please do not hesitate to contact me with questions. ? ?Sincerely, ? ? ?[place], MD ?Allergy/Immunology ?[facility] ? ? ?

## 2022-03-30 LAB — ALLERGENS W/TOTAL IGE AREA 2
Alternaria Alternata IgE: <0.1 kU/L
Aspergillus Fumigatus IgE: <0.1 kU/L
Bermuda Grass IgE: 2.17 kU/L — AB
Cat Dander IgE: 0.22 kU/L — AB
Cedar, Mountain IgE: <0.1 kU/L
Cladosporium Herbarum IgE: <0.1 kU/L
Cockroach, German IgE: <0.1 kU/L
Common Silver Birch IgE: <0.1 kU/L
Cottonwood IgE: <0.1 kU/L
D Farinae IgE: <0.1 kU/L
D Pteronyssinus IgE: 0.11 kU/L — AB
Dog Dander IgE: 0.27 kU/L — AB
Elm, American IgE: <0.1 kU/L
Johnson Grass IgE: 2.76 kU/L — AB
Maple/Box Elder IgE: <0.1 kU/L
Mouse Urine IgE: <0.1 kU/L
Oak, White IgE: <0.1 kU/L
Pecan, Hickory IgE: <0.1 kU/L
Penicillium Chrysogen IgE: <0.1 kU/L
Pigweed, Rough IgE: <0.1 kU/L
Ragweed, Short IgE: <0.1 kU/L
Sheep Sorrel IgE Qn: <0.1 kU/L
Timothy Grass IgE: 11.4 kU/L — AB
White Mulberry IgE: <0.1 kU/L

## 2022-03-30 LAB — ALPHA-GAL PANEL
Allergen Lamb IgE: 2.13 kU/L — AB
Beef IgE: 4.04 kU/L — AB
IgE (Immunoglobulin E), Serum: 142 IU/mL (ref 6–495)
O215-IgE Alpha-Gal: 10.3 kU/L — AB
Pork IgE: 1.29 kU/L — AB

## 2022-04-03 ENCOUNTER — Other Ambulatory Visit: Payer: Self-pay | Admitting: Certified Nurse Midwife

## 2022-04-26 ENCOUNTER — Other Ambulatory Visit: Payer: Self-pay | Admitting: Family Medicine

## 2022-06-19 ENCOUNTER — Encounter: Payer: No Typology Code available for payment source | Admitting: Certified Nurse Midwife

## 2022-09-27 ENCOUNTER — Ambulatory Visit: Payer: No Typology Code available for payment source | Admitting: Allergy

## 2022-10-07 ENCOUNTER — Encounter: Payer: Self-pay | Admitting: Certified Nurse Midwife

## 2022-10-08 ENCOUNTER — Encounter: Payer: Self-pay | Admitting: Certified Nurse Midwife

## 2022-11-06 ENCOUNTER — Ambulatory Visit (INDEPENDENT_AMBULATORY_CARE_PROVIDER_SITE_OTHER): Payer: Medicaid Other

## 2022-11-06 VITALS — BP 112/70 | Ht 61.0 in | Wt 131.0 lb

## 2022-11-06 DIAGNOSIS — Z3201 Encounter for pregnancy test, result positive: Secondary | ICD-10-CM | POA: Diagnosis not present

## 2022-11-06 DIAGNOSIS — N912 Amenorrhea, unspecified: Secondary | ICD-10-CM

## 2022-11-06 LAB — POCT URINE PREGNANCY: Preg Test, Ur: POSITIVE — AB

## 2022-11-06 NOTE — Progress Notes (Signed)
Subjective:    Dana Hood is a 26 y.o. female who presents for evaluation of amenorrhea. She believes she could be pregnant. Pregnancy is desired.  Last period was normal.   Lab Review Urine HCG: positive    Assessment:    Absence of menstruation.     Plan:    Pregnancy Test:  Positive: EDC: 07/11/2023. Briefly discussed positive results and sent to check out for scheduling for New OB appointments.

## 2022-12-04 ENCOUNTER — Ambulatory Visit (INDEPENDENT_AMBULATORY_CARE_PROVIDER_SITE_OTHER): Payer: Self-pay

## 2022-12-04 VITALS — Wt 131.0 lb

## 2022-12-04 DIAGNOSIS — Z369 Encounter for antenatal screening, unspecified: Secondary | ICD-10-CM

## 2022-12-04 DIAGNOSIS — O0990 Supervision of high risk pregnancy, unspecified, unspecified trimester: Secondary | ICD-10-CM | POA: Insufficient documentation

## 2022-12-04 DIAGNOSIS — Z3480 Encounter for supervision of other normal pregnancy, unspecified trimester: Secondary | ICD-10-CM

## 2022-12-04 DIAGNOSIS — Z3689 Encounter for other specified antenatal screening: Secondary | ICD-10-CM

## 2022-12-04 NOTE — Patient Instructions (Addendum)
Commonly Asked Questions During Pregnancy  Cats: A parasite can be excreted in cat feces.  To avoid exposure you need to have another person empty the little box.  If you must empty the litter box you will need to wear gloves.  Wash your hands after handling your cat.  This parasite can also be found in raw or undercooked meat so this should also be avoided.  Colds, Sore Throats, Flu: Please check your medication sheet to see what you can take for symptoms.  If your symptoms are unrelieved by these medications please call the office.  Dental Work: Most any dental work your dentist recommends is permitted.  X-rays should only be taken during the first trimester if absolutely necessary.  Your abdomen should be shielded with a lead apron during all x-rays.  Please notify your provider prior to receiving any x-rays.  Novocaine is fine; gas is not recommended.  If your dentist requires a note from us prior to dental work please call the office and we will provide one for you.  Exercise: Exercise is an important part of staying healthy during your pregnancy.  You may continue most exercises you were accustomed to prior to pregnancy.  Later in your pregnancy you will most likely notice you have difficulty with activities requiring balance like riding a bicycle.  It is important that you listen to your body and avoid activities that put you at a higher risk of falling.  Adequate rest and staying well hydrated are a must!  If you have questions about the safety of specific activities ask your provider.    Exposure to Children with illness: Try to avoid obvious exposure; report any symptoms to us when noted,  If you have chicken pos, red measles or mumps, you should be immune to these diseases.   Please do not take any vaccines while pregnant unless you have checked with your OB provider.  Fetal Movement: After 28 weeks we recommend you do "kick counts" twice daily.  Lie or sit down in a calm quiet environment and  count your baby movements "kicks".  You should feel your baby at least 10 times per hour.  If you have not felt 10 kicks within the first hour get up, walk around and have something sweet to eat or drink then repeat for an additional hour.  If count remains less than 10 per hour notify your provider.  Fumigating: Follow your pest control agent's advice as to how long to stay out of your home.  Ventilate the area well before re-entering.  Hemorrhoids:   Most over-the-counter preparations can be used during pregnancy.  Check your medication to see what is safe to use.  It is important to use a stool softener or fiber in your diet and to drink lots of liquids.  If hemorrhoids seem to be getting worse please call the office.   Hot Tubs:  Hot tubs Jacuzzis and saunas are not recommended while pregnant.  These increase your internal body temperature and should be avoided.  Intercourse:  Sexual intercourse is safe during pregnancy as long as you are comfortable, unless otherwise advised by your provider.  Spotting may occur after intercourse; report any bright red bleeding that is heavier than spotting.  Labor:  If you know that you are in labor, please go to the hospital.  If you are unsure, please call the office and let us help you decide what to do.  Lifting, straining, etc:  If your job requires heavy   lifting or straining please check with your provider for any limitations.  Generally, you should not lift items heavier than that you can lift simply with your hands and arms (no back muscles)  Painting:  Paint fumes do not harm your pregnancy, but may make you ill and should be avoided if possible.  Latex or water based paints have less odor than oils.  Use adequate ventilation while painting.  Permanents & Hair Color:  Chemicals in hair dyes are not recommended as they cause increase hair dryness which can increase hair loss during pregnancy.  " Highlighting" and permanents are allowed.  Dye may be  absorbed differently and permanents may not hold as well during pregnancy.  Sunbathing:  Use a sunscreen, as skin burns easily during pregnancy.  Drink plenty of fluids; avoid over heating.  Tanning Beds:  Because their possible side effects are still unknown, tanning beds are not recommended.  Ultrasound Scans:  Routine ultrasounds are performed at approximately 20 weeks.  You will be able to see your baby's general anatomy an if you would like to know the gender this can usually be determined as well.  If it is questionable when you conceived you may also receive an ultrasound early in your pregnancy for dating purposes.  Otherwise ultrasound exams are not routinely performed unless there is a medical necessity.  Although you can request a scan we ask that you pay for it when conducted because insurance does not cover " patient request" scans.  Work: If your pregnancy proceeds without complications you may work until your due date, unless your physician or employer advises otherwise.  Round Ligament Pain/Pelvic Discomfort:  Sharp, shooting pains not associated with bleeding are fairly common, usually occurring in the second trimester of pregnancy.  They tend to be worse when standing up or when you remain standing for long periods of time.  These are the result of pressure of certain pelvic ligaments called "round ligaments".  Rest, Tylenol and heat seem to be the most effective relief.  As the womb and fetus grow, they rise out of the pelvis and the discomfort improves.  Please notify the office if your pain seems different than that described.  It may represent a more serious condition.  Common Medications Safe in Pregnancy  Acne:      Constipation:  Benzoyl Peroxide     Colace  Clindamycin      Dulcolax Suppository  Topica Erythromycin     Fibercon  Salicylic Acid      Metamucil         Miralax AVOID:        Senakot   Accutane    Cough:  Retin-A       Cough  Drops  Tetracycline      Phenergan w/ Codeine if Rx  Minocycline      Robitussin (Plain & DM)  Antibiotics:     Crabs/Lice:  Ceclor       RID  Cephalosporins    AVOID:  E-Mycins      Kwell  Keflex  Macrobid/Macrodantin   Diarrhea:  Penicillin      Kao-Pectate  Zithromax      Imodium AD         PUSH FLUIDS AVOID:       Cipro     Fever:  Tetracycline      Tylenol (Regular or Extra  Minocycline       Strength)  Levaquin      Extra Strength-Do not  Exceed 8 tabs/24 hrs Caffeine:        <200mg/day (equiv. To 1 cup of coffee or  approx. 3 12 oz sodas)         Gas: Cold/Hayfever:       Gas-X  Benadryl      Mylicon  Claritin       Phazyme  **Claritin-D        Chlor-Trimeton    Headaches:  Dimetapp      ASA-Free Excedrin  Drixoral-Non-Drowsy     Cold Compress  Mucinex (Guaifenasin)     Tylenol (Regular or Extra  Sudafed/Sudafed-12 Hour     Strength)  **Sudafed PE Pseudoephedrine   Tylenol Cold & Sinus     Vicks Vapor Rub  Zyrtec  **AVOID if Problems With Blood Pressure         Heartburn: Avoid lying down for at least 1 hour after meals  Aciphex      Maalox     Rash:  Milk of Magnesia     Benadryl    Mylanta       1% Hydrocortisone Cream  Pepcid  Pepcid Complete   Sleep Aids:  Prevacid      Ambien   Prilosec       Benadryl  Rolaids       Chamomile Tea  Tums (Limit 4/day)     Unisom         Tylenol PM         Warm milk-add vanilla or  Hemorrhoids:       Sugar for taste  Anusol/Anusol H.C.  (RX: Analapram 2.5%)  Sugar Substitutes:  Hydrocortisone OTC     Ok in moderation  Preparation H      Tucks        Vaseline lotion applied to tissue with wiping    Herpes:     Throat:  Acyclovir      Oragel  Famvir  Valtrex     Vaccines:         Flu Shot Leg Cramps:       *Gardasil  Benadryl      Hepatitis A         Hepatitis B Nasal Spray:       Pneumovax  Saline Nasal Spray     Polio Booster         Tetanus Nausea:       Tuberculosis test or PPD  Vitamin  B6 25 mg TID   AVOID:    Dramamine      *Gardasil  Emetrol       Live Poliovirus  Ginger Root 250 mg QID    MMR (measles, mumps &  High Complex Carbs @ Bedtime    rebella)  Sea Bands-Accupressure    Varicella (Chickenpox)  Unisom 1/2 tab TID     *No known complications           If received before Pain:         Known pregnancy;   Darvocet       Resume series after  Lortab        Delivery  Percocet    Yeast:   Tramadol      Femstat  Tylenol 3      Gyne-lotrimin  Ultram       Monistat  Vicodin           MISC:         All Sunscreens             Hair Coloring/highlights          Insect Repellant's          (Including DEET)         Mystic Tans  

## 2022-12-04 NOTE — Progress Notes (Signed)
New OB Intake  I connected with  Dana Hood Card on 12/04/22 at 10:15 AM EST by telephone and verified that I am speaking with the correct person using two identifiers. Nurse is located at [facility] and pt is located at her Mom's.  I explained I am completing New OB Intake today. We discussed her EDD of 07/11/2023 that is based on LMP of 10/04/2022. Pt is G2/P1001. I reviewed her allergies, medications, Medical/Surgical/OB history, and appropriate screenings. Based on history, this is a/an pregnancy uncomplicated .   Patient Active Problem List   Diagnosis Date Noted   Supervision of other normal pregnancy, antepartum 12/04/2022   Anxiety 10/26/2019   GERD (gastroesophageal reflux disease) 10/26/2019   Chronic hypertension 10/26/2019   Allergic urticaria history of and currently resolved  09/21/2019   Migraines 01/30/2017    Concerns addressed today None  Delivery Plans:  Plans to deliver at [facility].  Anatomy US Explained first scheduled [place] will be 12/24/22 and an anatomy scan will be done at 20 weeks.  Labs Discussed genetic screening with patient. Patient desires genetic testing to be drawn with new OB labs. Discussed possible labs to be drawn at new OB appointment.  COVID Vaccine Patient has not had COVID vaccine.   Social Determinants of Health Food Insecurity: denies food insecurity Transportation: Patient denies transportation needs. Childcare: Discussed no children allowed at ultrasound appointments.   First visit review I reviewed new OB appt with pt. I explained she will have ob bloodwork and pap smear/pelvic exam if indicated. Pt desires to be seen by [name], CNM at first visit; encounter routed to appropriate provider.   Loran Senters, [facility] 12/04/2022  10:44 AM

## 2022-12-10 ENCOUNTER — Encounter: Payer: Medicaid Other | Admitting: Certified Nurse Midwife

## 2022-12-23 NOTE — L&D Delivery Note (Signed)
Delivery Note   Malesha Suliman is a 27 y.o. G2P2002 at [redacted]w[redacted]d Estimated Date of Delivery: 07/11/23  PRE-OPERATIVE DIAGNOSIS:  1) [redacted]w[redacted]d pregnancy.  2) chronic hypertension on medication 3) anxiety   POST-OPERATIVE DIAGNOSIS:  1) [redacted]w[redacted]d pregnancy s/p Vaginal, Spontaneous  And above  Delivery Type: Vaginal, Spontaneous    Delivery Anesthesia: Epidural   Labor Complications:  none    ESTIMATED BLOOD LOSS: 50  ml    FINDINGS:   1) female infant, Apgar scores of 8   at 1 minute and 9   at 5 minutes and a birthweight of   ounces.     SPECIMENS:   PLACENTA:   Appearance: Intact    Removal: Spontaneous      Disposition:    CORD BLOOD: Collected/Not Indicated  DISPOSITION:  Infant left in stable condition in the delivery room, with L&D personnel and mother,  NARRATIVE SUMMARY: Labor course:  Chantele Corado is a [id number] at [redacted]w[redacted]d who presented to Labor & Delivery for induction of labor. Her initial cervical exam was 3/80/-2. Labor proceeded with augmentation and she was found to be completely dilated at 0554. With excellent maternal pushing effort, she birthed a viable female infant at [phone]. There was not a nuchal cord. The shoulders were birthed without difficulty. The infant was placed skin-to-skin with mother. The cord was doubly clamped and cut when pulsations ceased. The placenta delivered spontaneously and was noted to be intact with a 3VC. A perineal and vaginal examination was performed. Episiotomy/Lacerations: None  Mother and baby were left in stable condition.   Lindalou Hose Karime Scheuermann, CNM 07/01/2023 7:08 AM

## 2022-12-24 ENCOUNTER — Other Ambulatory Visit: Payer: No Typology Code available for payment source

## 2022-12-24 ENCOUNTER — Other Ambulatory Visit: Payer: Self-pay | Admitting: Certified Nurse Midwife

## 2022-12-24 ENCOUNTER — Other Ambulatory Visit: Payer: Self-pay | Admitting: Family Medicine

## 2022-12-24 ENCOUNTER — Ambulatory Visit (INDEPENDENT_AMBULATORY_CARE_PROVIDER_SITE_OTHER): Payer: No Typology Code available for payment source

## 2022-12-24 DIAGNOSIS — Z369 Encounter for antenatal screening, unspecified: Secondary | ICD-10-CM

## 2022-12-24 DIAGNOSIS — Z3480 Encounter for supervision of other normal pregnancy, unspecified trimester: Secondary | ICD-10-CM

## 2022-12-24 DIAGNOSIS — Z3687 Encounter for antenatal screening for uncertain dates: Secondary | ICD-10-CM

## 2022-12-24 DIAGNOSIS — Z3A11 11 weeks gestation of pregnancy: Secondary | ICD-10-CM | POA: Diagnosis not present

## 2022-12-25 LAB — CBC/D/PLT+RPR+RH+ABO+RUBIGG...
Antibody Screen: NEGATIVE
Basophils Absolute: 0.1 10*3/uL (ref 0.0–0.2)
Basos: 1 %
EOS (ABSOLUTE): 0.1 10*3/uL (ref 0.0–0.4)
Eos: 2 %
HCV Ab: NONREACTIVE
HIV Screen 4th Generation wRfx: NONREACTIVE
Hematocrit: 37.4 % (ref 34.0–46.6)
Hemoglobin: 12.6 g/dL (ref 11.1–15.9)
Hepatitis B Surface Ag: NEGATIVE
Immature Grans (Abs): 0 10*3/uL (ref 0.0–0.1)
Immature Granulocytes: 0 %
Lymphocytes Absolute: 1.2 10*3/uL (ref 0.7–3.1)
Lymphs: 21 %
MCH: 29.6 pg (ref 26.6–33.0)
MCHC: 33.7 g/dL (ref 31.5–35.7)
MCV: 88 fL (ref 79–97)
Monocytes Absolute: 0.5 10*3/uL (ref 0.1–0.9)
Monocytes: 9 %
Neutrophils Absolute: 4 10*3/uL (ref 1.4–7.0)
Neutrophils: 67 %
Platelets: 282 10*3/uL (ref 150–450)
RBC: 4.25 x10E6/uL (ref 3.77–5.28)
RDW: 14.4 % (ref 11.7–15.4)
RPR Ser Ql: NONREACTIVE
Rh Factor: POSITIVE
Rubella Antibodies, IGG: <0.9 index — ABNORMAL LOW (ref >0.99)
Varicella zoster IgG: 403 index (ref >165)
WBC: 5.9 10*3/uL (ref 3.4–10.8)

## 2022-12-25 LAB — HCV INTERPRETATION

## 2022-12-28 LAB — MATERNIT 21 PLUS CORE, BLOOD
Fetal Fraction: 9
Result (T21): NEGATIVE
Trisomy 13 (Patau syndrome): NEGATIVE
Trisomy 18 (Edwards syndrome): NEGATIVE
Trisomy 21 (Down syndrome): NEGATIVE

## 2022-12-30 ENCOUNTER — Telehealth: Payer: Self-pay

## 2022-12-30 ENCOUNTER — Encounter: Payer: Self-pay | Admitting: Certified Nurse Midwife

## 2022-12-30 NOTE — Telephone Encounter (Signed)
Attempted to call, no answer.  Left message.

## 2022-12-30 NOTE — Telephone Encounter (Signed)
Pt called triage needing Dana Hood to call her back regarding her Mat21 results. Pts states they all look normal but the lab had put some comments that make her think she needs further evaluation. Im not sure what she's reading. Please advise.

## 2023-01-01 ENCOUNTER — Ambulatory Visit (INDEPENDENT_AMBULATORY_CARE_PROVIDER_SITE_OTHER): Payer: No Typology Code available for payment source | Admitting: Obstetrics

## 2023-01-01 ENCOUNTER — Encounter: Payer: Medicaid Other | Admitting: Obstetrics and Gynecology

## 2023-01-01 ENCOUNTER — Other Ambulatory Visit (HOSPITAL_COMMUNITY)
Admission: RE | Admit: 2023-01-01 | Discharge: 2023-01-01 | Disposition: A | Discharge status: Home / Self Care | Payer: No Typology Code available for payment source | Source: Ambulatory Visit | Attending: Obstetrics | Admitting: Obstetrics

## 2023-01-01 VITALS — BP 127/84 | HR 86 | Wt 132.0 lb

## 2023-01-01 DIAGNOSIS — Z3480 Encounter for supervision of other normal pregnancy, unspecified trimester: Secondary | ICD-10-CM

## 2023-01-01 DIAGNOSIS — Z1151 Encounter for screening for human papillomavirus (HPV): Secondary | ICD-10-CM

## 2023-01-01 DIAGNOSIS — O10011 Pre-existing essential hypertension complicating pregnancy, first trimester: Secondary | ICD-10-CM

## 2023-01-01 DIAGNOSIS — Z3A12 12 weeks gestation of pregnancy: Secondary | ICD-10-CM | POA: Diagnosis not present

## 2023-01-01 DIAGNOSIS — Z113 Encounter for screening for infections with a predominantly sexual mode of transmission: Secondary | ICD-10-CM

## 2023-01-01 DIAGNOSIS — Z87891 Personal history of nicotine dependence: Secondary | ICD-10-CM | POA: Diagnosis not present

## 2023-01-01 DIAGNOSIS — I10 Essential (primary) hypertension: Secondary | ICD-10-CM

## 2023-01-01 DIAGNOSIS — O99891 Other specified diseases and conditions complicating pregnancy: Secondary | ICD-10-CM | POA: Diagnosis not present

## 2023-01-01 NOTE — Progress Notes (Signed)
Patient here today for NOB visit. Patient had labs and dating u/s. G2. Patient is on Labetalol for CHTN. Patient no longer having nausea. On Zoloft for anxiety. Getting baseline EDPS today.

## 2023-01-01 NOTE — Progress Notes (Signed)
New Obstetric Patient H&P    Chief Complaint: "Desires prenatal care"   History of Present Illness: Patient is a 27 y.o. G2P1001 Not Hispanic or Latino female, LMP 10/04/2022 presents with amenorrhea and positive home pregnancy test. Based on her  LMP, her EDD is Estimated Date of Delivery: 07/11/23 and her EGA is [redacted]w[redacted]d. Cycles are 5. days, regular, and occur approximately every : 28 days. Her last pap smear was a few years ago and was no abnormalities. She has chronic HTN, and isnow on Labetalol 100 mg po BID.   She had a urine pregnancy test which was positive 4 week(s)  ago. Her last menstrual period was normal and lasted for  5 or 6 day(s). Since her LMP she claims she has experienced some fatigue and previously, some nausea shich has receeded.. She denies vaginal bleeding. Her past medical history is noncontributory. Her prior pregnancies are notable for chronic HTN  Since her LMP, she admits to the use of tobacco products  no She claims she has gained   no pounds since the start of her pregnancy.  There are cats in the home in the home  no She admits close contact with children on a regular basis  yes  She has had chicken pox in the past yes She has had Tuberculosis exposures, symptoms, or previously tested positive for TB   no Current or past history of domestic violence. no  Genetic Screening/Teratology Counseling: (Includes patient, baby's father, or anyone in either family with:)   1. Patient's age >/= 58 at [facility]  no 2. Thalassemia ([place], [place], Mediterranean, or Asian background): MCV<80  no 3. Neural tube defect (meningomyelocele, spina bifida, anencephaly)  no 4. Congenital heart defect  no  5. Down syndrome  no 6. Tay-Sachs (Jewish, [place]  no 7. Canavan's Disease  no 8. Sickle cell disease or trait (African)  no  9. Hemophilia or other blood disorders  no  10. Muscular dystrophy  no  11. Cystic fibrosis  no  12. Huntington's Chorea  no  13. Mental  retardation/autism  no 14. Other inherited genetic or chromosomal disorder  no 15. Maternal metabolic disorder (DM, PKU, etc)  no 16. Patient or FOB with a child with a birth defect not listed above no  16a. Patient or FOB with a birth defect themselves no 17. Recurrent pregnancy loss, or stillbirth  no  18. Any medications since LMP other than prenatal vitamins (include vitamins, supplements, OTC meds, drugs, alcohol)  yes- takes labetalol for CHTN 19. Any other genetic/environmental exposure to discuss  no  Infection History:   1. Lives with someone with TB or TB exposed  no  2. Patient or partner has history of genital herpes  no 3. Rash or viral illness since LMP  no 4. History of STI (GC, CT, HPV, syphilis, HIV)  no 5. History of recent travel :  no  Other pertinent information:  no     Review of Systems:10 point review of systems negative unless otherwise noted in HPI  Past Medical History:  Past Medical History:  Diagnosis Date   Allergy to alpha-gal    Anxiety    Atrial fibrillation with tachycardic ventricular rate (HCC)    Hypertension     Past Surgical History:  Past Surgical History:  Procedure Laterality Date   WISDOM TOOTH EXTRACTION     four; age 52    Gynecologic History: Patient's last menstrual period was 10/04/2022 (exact date).  Obstetric History: G2P1001  Family History:  Family History  Problem Relation Age of Onset   Hyperlipidemia Mother    Hypertension Mother    Migraines Mother    Hypertension Father    Hypertension Sister    Hyperlipidemia Sister    Hyperlipidemia Maternal Grandmother    Hypertension Maternal Grandmother    Heart failure Maternal Grandfather    Healthy Paternal Grandmother    Healthy Paternal Grandfather    Rheum arthritis Maternal Uncle    Breast cancer Neg Hx    Ovarian cancer Neg Hx    Colon cancer Neg Hx     Social History:  Social History   Socioeconomic History   Marital status: Married    Spouse  name: Aaron Edelman   Number of children: 2   Years of education: 14   Highest education level: Not on file  Occupational History   Occupation: stay at home mom  Tobacco Use   Smoking status: Former    Packs/day: 0.25    Years: 5.00    Total pack years: 1.25    Types: Cigarettes    Quit date: 04/01/2022    Years since quitting: 0.7   Smokeless tobacco: Never   Tobacco comments:    3-4 cig/day  Vaping Use   Vaping Use: Never used  Substance and Sexual Activity   Alcohol use: Not Currently    Comment: social   Drug use: Never   Sexual activity: Yes    Partners: Male    Birth control/protection: None  Other Topics Concern   Not on file  Social History Narrative   Not on file   Social Determinants of Health   Financial Resource Strain: Low Risk  (12/04/2022)   Overall Financial Resource Strain (CARDIA)    Difficulty of Paying Living Expenses: Not very hard  Food Insecurity: No Food Insecurity (12/04/2022)   Hunger Vital Sign    Worried About Running Out of Food in the Last Year: Never true    Ran Out of Food in the Last Year: Never true  Transportation Needs: No Transportation Needs (12/04/2022)   PRAPARE - Hydrologist (Medical): No    Lack of Transportation (Non-Medical): No  Physical Activity: Sufficiently Active (12/04/2022)   Exercise Vital Sign    Days of Exercise per Week: 7 days    Minutes of Exercise per Session: 50 min  Stress: No Stress Concern Present (12/04/2022)   [place]    Feeling of Stress : Not at all  Social Connections: Moderately Integrated (12/04/2022)   Social Connection and Isolation Panel [NHANES]    Frequency of Communication with Friends and Family: More than three times a week    Frequency of Social Gatherings with Friends and Family: Once a week    Attends Religious Services: 1 to 4 times per year    Active Member of [employer] or Organizations: No     Attends Archivist Meetings: Never    Marital Status: Married  Human resources officer Violence: Not At Risk (12/04/2022)   Humiliation, Afraid, Rape, and Kick questionnaire    Fear of Current or Ex-Partner: No    Emotionally Abused: No    Physically Abused: No    Sexually Abused: No    Allergies:  Allergies  Allergen Reactions   Alpha-Gal     No red meat    Medications: Prior to Admission medications   Medication Sig Start Date End Date Taking?  Authorizing Provider  cetirizine (ZYRTEC) 10 MG tablet Take 1 tablet (10 mg total) by mouth daily. 07/06/19   Flinchum, Kelby Aline, FNP  EPINEPHrine (EPIPEN 2-PAK) 0.3 mg/0.3 mL IJ SOAJ injection Inject 0.3 mg into the muscle as needed for anaphylaxis. Dispense Mylan or Teva brand 03/28/22   Kennith Gain, MD  ipratropium (ATROVENT) 0.03 % nasal spray Place 2 sprays into both nostrils 3 (three) times daily as needed for rhinitis. 02/24/22   Scot Jun, FNP  labetalol (NORMODYNE) 100 MG tablet Take 1 tablet (100 mg total) by mouth 2 (two) times daily. Follow up appt needed 01/28/22   Binnie Rail, MD  prenatal vitamin w/FE, FA (NATACHEW) 29-1 MG CHEW chewable tablet Chew 2 tablets by mouth daily at 12 noon.    [provider]  sertraline (ZOLOFT) 100 MG tablet Take 1 tablet (100 mg total) by mouth daily. 01/28/22   Binnie Rail, MD    Physical Exam Vitals: Last menstrual period 10/04/2022.  General: NAD HEENT: normocephalic, anicteric Thyroid: no enlargement, no palpable nodules Pulmonary: No increased work of breathing, CTAB Cardiovascular: RRR, distal pulses 2+ Abdomen: NABS, soft, non-tender, non-distended.  Umbilicus without lesions.  No hepatomegaly, splenomegaly or masses palpable. No evidence of hernia  Genitourinary:  External: Normal external female genitalia.  Normal urethral meatus, normal  Bartholin's and Skene's glands.    Vagina: Normal vaginal mucosa, no evidence of prolapse.    Cervix: Grossly  normal in appearance, no bleeding  Uterus: antevertedNon-enlarged, mobile, normal contour.  No CMT  Adnexa: ovaries non-enlarged, no adnexal masses  Rectal: deferred Extremities: no edema, erythema, or tenderness Neurologic: Grossly intact Psychiatric: mood appropriate, affect full   Assessment: 27 y.o. G2P1001 at [redacted]w[redacted]d presenting to initiate prenatal care Chronic Hypertension- on labetalol 100 mg po BID. Normotensive today.  Plan: 1) Avoid alcoholic beverages. 2) Patient encouraged not to smoke.  3) Discontinue the use of all non-medicinal drugs and chemicals.  4) Take prenatal vitamins daily.  5) Nutrition, food safety (fish, cheese advisories, and high nitrite foods) and exercise discussed. 6) Hospital and practice style discussed with cross coverage system.  7) Genetic Screening, such as with 1st Trimester Screening, cell free fetal DNA, AFP testing, and Ultrasound, as well as with amniocentesis and CVS as appropriate, is discussed with patient. At the conclusion of today's visit patient results reviewed genetic testing 8) Patient is asked about travel to areas at risk for the Zika virus, and counseled to avoid travel and exposure to mosquitoes or sexual partners who may have themselves been exposed to the virus. Testing is discussed, and will be ordered as appropriate.  She is aware that there will likely be a recommendation for IOL by 39 weeks. We will continue to discuss this as her pregnancy proceeds. Reviewed he labs today. Her physical is complete, Pap done with cultures.  RTC in 4 weeks . Imagene Riches, CNM  01/01/2023 5:12 PM

## 2023-01-03 LAB — CERVICOVAGINAL ANCILLARY ONLY
Bacterial Vaginitis (gardnerella): NEGATIVE
Candida Glabrata: NEGATIVE
Candida Vaginitis: POSITIVE — AB
Chlamydia: NEGATIVE
Comment: NEGATIVE
Comment: NEGATIVE
Comment: NEGATIVE
Comment: NEGATIVE
Comment: NEGATIVE
Comment: NORMAL
Neisseria Gonorrhea: NEGATIVE
Trichomonas: NEGATIVE

## 2023-01-03 LAB — URINE CULTURE: Organism ID, Bacteria: NO GROWTH

## 2023-01-05 ENCOUNTER — Encounter: Payer: Self-pay | Admitting: Obstetrics

## 2023-01-13 LAB — CYTOLOGY - PAP
Comment: NEGATIVE
Diagnosis: UNDETERMINED — AB
High risk HPV: NEGATIVE

## 2023-01-14 ENCOUNTER — Encounter: Payer: Self-pay | Admitting: Obstetrics

## 2023-01-22 ENCOUNTER — Telehealth: Payer: No Typology Code available for payment source | Admitting: Physician Assistant

## 2023-01-22 DIAGNOSIS — J029 Acute pharyngitis, unspecified: Secondary | ICD-10-CM | POA: Diagnosis not present

## 2023-01-22 NOTE — Progress Notes (Signed)
I have spent 5 minutes in review of e-visit questionnaire, review and updating patient chart, medical decision making and response to patient.   Bridie Colquhoun Cody Azriella Mattia, PA-C    

## 2023-01-22 NOTE — Progress Notes (Signed)
E-Visit for Sore Throat  We are sorry that you are not feeling well.  Here is how we plan to help!  Your symptoms indicate a likely viral infection (Pharyngitis).   Pharyngitis is inflammation in the back of the throat which can cause a sore throat, scratchiness and sometimes difficulty swallowing.   Pharyngitis is typically caused by a respiratory virus and will just run its course.  Please keep in mind that your symptoms could last up to 10 days.  For throat pain, we recommend over the counter oral pain relief medications such as acetaminophen.  Topical treatments such as oral throat lozenges or sprays may be used as needed. I would recommend adding Benadryl to the mix giving pregnancy as this is safe and will help with inflammation and swelling.  Avoid close contact with loved ones, especially the very young and elderly.  Remember to wash your hands thoroughly throughout the day as this is the number one way to prevent the spread of infection and wipe down door knobs and counters with disinfectant. Giving pregnancy status and limitation in what you can take, if anything continues to progress we want you to be evaluated in person ASAP.  After careful review of your answers, I would not recommend an antibiotic for your condition.  Antibiotics should not be used to treat conditions that we suspect are caused by viruses like the virus that causes the common cold or flu. However, some people can have Strep with atypical symptoms. You may need formal testing in clinic or office to confirm if your symptoms continue or worsen.  Providers prescribe antibiotics to treat infections caused by bacteria. Antibiotics are very powerful in treating bacterial infections when they are used properly.  To maintain their effectiveness, they should be used only when necessary.  Overuse of antibiotics has resulted in the development of super bugs that are resistant to treatment!    Home Care: Only take medications as  instructed by your medical team. Do not drink alcohol while taking these medications. A steam or ultrasonic humidifier can help congestion.  You can place a towel over your head and breathe in the steam from hot water coming from a faucet. Avoid close contacts especially the very young and the elderly. Cover your mouth when you cough or sneeze. Always remember to wash your hands.  Get Help Right Away If: You develop worsening fever or throat pain. You develop a severe head ache or visual changes. Your symptoms persist after you have completed your treatment plan.  Make sure you Understand these instructions. Will watch your condition. Will get help right away if you are not doing well or get worse.   Thank you for choosing an e-visit.  Your e-visit answers were reviewed by a board certified advanced clinical practitioner to complete your personal care plan. Depending upon the condition, your plan could have included both over the counter or prescription medications.  Please review your pharmacy choice. Make sure the pharmacy is open so you can pick up prescription now. If there is a problem, you may contact your provider through [employer] and have the prescription routed to another pharmacy.  Your safety is important to [place]. If you have drug allergies check your prescription carefully.   For the next 24 hours you can use MyChart to ask questions about today's visit, request a non-urgent call back, or ask for a work or school excuse. You will get an email in the next two days asking about your experience.  I hope that your e-visit has been valuable and will speed your recovery.

## 2023-01-30 ENCOUNTER — Encounter: Payer: No Typology Code available for payment source | Admitting: Certified Nurse Midwife

## 2023-01-31 ENCOUNTER — Encounter: Payer: No Typology Code available for payment source | Admitting: Licensed Practical Nurse

## 2023-01-31 ENCOUNTER — Ambulatory Visit (INDEPENDENT_AMBULATORY_CARE_PROVIDER_SITE_OTHER): Payer: No Typology Code available for payment source | Admitting: Licensed Practical Nurse

## 2023-01-31 ENCOUNTER — Encounter: Payer: Self-pay | Admitting: Licensed Practical Nurse

## 2023-01-31 VITALS — BP 121/81 | HR 76 | Wt 134.7 lb

## 2023-01-31 DIAGNOSIS — Z3482 Encounter for supervision of other normal pregnancy, second trimester: Secondary | ICD-10-CM

## 2023-01-31 DIAGNOSIS — Z3A17 17 weeks gestation of pregnancy: Secondary | ICD-10-CM

## 2023-01-31 DIAGNOSIS — Z3480 Encounter for supervision of other normal pregnancy, unspecified trimester: Secondary | ICD-10-CM

## 2023-01-31 LAB — POCT URINALYSIS DIPSTICK
Bilirubin, UA: NEGATIVE
Blood, UA: NEGATIVE
Glucose, UA: NEGATIVE
Ketones, UA: NEGATIVE
Leukocytes, UA: NEGATIVE
Nitrite, UA: NEGATIVE
Protein, UA: NEGATIVE
Spec Grav, UA: 1.015 (ref 1.010–1.025)
Urobilinogen, UA: 1 E.U./dL
pH, UA: 6.5 (ref 5.0–8.0)

## 2023-01-31 NOTE — Progress Notes (Signed)
Routine Prenatal Care Visit  Subjective  Dana Hood is a 27 y.o. G2P1001 at 59w0dbeing seen today for ongoing prenatal care.  She is currently monitored for the following issues for this high-risk pregnancy and has Migraines; Allergic urticaria history of and currently resolved ; Anxiety; GERD (gastroesophageal reflux disease); Chronic hypertension; and Supervision of other normal pregnancy, antepartum on their problem list.  ----------------------------------------------------------------------------------- Patient reports no complaints.  Feeling great. Mood has been good. Has been tired. Is a SAHM.  -was told to start baby ASA does not seem too concerned so has not started, reminded to start ASA -Here with husband and son colt   Contractions: Not present. Vag. Bleeding: None.  Movement: Present. Leaking Fluid denies.  ----------------------------------------------------------------------------------- The following portions of the patient's history were reviewed and updated as appropriate: allergies, current medications, past family history, past medical history, past social history, past surgical history and problem list. Problem list updated.  Objective  Blood pressure 121/81, pulse 76, weight 134 lb 11.2 oz (61.1 kg), last menstrual period 10/04/2022. Pregravid weight 130 lb (59 kg) Total Weight Gain 4 lb 11.2 oz (2.132 kg) Urinalysis: Urine Protein    Urine Glucose    Fetal Status: Fetal Heart Rate (bpm): 144   Movement: Present     General:  Alert, oriented and cooperative. Patient is in no acute distress.  Skin: Skin is warm and dry. No rash noted.   Cardiovascular: Normal heart rate noted  Respiratory: Normal respiratory effort, no problems with respiration noted  Abdomen: Soft, gravid, appropriate for gestational age. Pain/Pressure: Absent     Pelvic:  Cervical exam deferred        Extremities: Normal range of motion.  Edema: Trace  Mental Status: Normal mood and  affect. Normal behavior. Normal judgment and thought content.   Assessment   28y.o. G2P1001 at 133w0dy  07/11/2023, by Last Menstrual Period presenting for routine prenatal visit  Plan   second Problems (from 12/04/22 to present)     No problems associated with this episode.       general obstetric precautions including but not limited to vaginal bleeding, contractions, leaking of fluid and fetal movement were reviewed in detail with the patient. Please refer to After Visit Summary for other counseling recommendations.   Return in about 4 weeks (around 02/28/2023) for ROB.  LyRoberto ScalesCNAdaedical Group  02/02/23  11:27 AM

## 2023-02-01 ENCOUNTER — Encounter: Payer: Self-pay | Admitting: Licensed Practical Nurse

## 2023-02-03 ENCOUNTER — Other Ambulatory Visit: Payer: Self-pay

## 2023-02-03 DIAGNOSIS — R110 Nausea: Secondary | ICD-10-CM

## 2023-02-03 MED ORDER — BONJESTA 20-20 MG PO TBCR: 1.0000 | EXTENDED_RELEASE_TABLET | Freq: Two times a day (BID) | ORAL | 0 refills | Status: DC

## 2023-02-04 ENCOUNTER — Other Ambulatory Visit: Payer: Self-pay

## 2023-02-04 DIAGNOSIS — Z3A17 17 weeks gestation of pregnancy: Secondary | ICD-10-CM

## 2023-02-04 DIAGNOSIS — Z3480 Encounter for supervision of other normal pregnancy, unspecified trimester: Secondary | ICD-10-CM

## 2023-02-05 ENCOUNTER — Other Ambulatory Visit: Payer: Self-pay

## 2023-02-05 MED ORDER — ONDANSETRON 4 MG PO TBDP: 4.0000 mg | ORAL_TABLET | Freq: Four times a day (QID) | ORAL | 0 refills | Status: DC | PRN

## 2023-02-10 ENCOUNTER — Encounter: Payer: No Typology Code available for payment source | Admitting: Advanced Practice Midwife

## 2023-02-23 ENCOUNTER — Other Ambulatory Visit: Payer: Self-pay | Admitting: Internal Medicine

## 2023-02-24 ENCOUNTER — Ambulatory Visit
Admission: RE | Admit: 2023-02-24 | Discharge: 2023-02-24 | Disposition: A | Discharge status: Home / Self Care | Payer: No Typology Code available for payment source | Source: Ambulatory Visit | Attending: Licensed Practical Nurse | Admitting: Licensed Practical Nurse

## 2023-02-24 ENCOUNTER — Other Ambulatory Visit: Payer: Self-pay | Admitting: Internal Medicine

## 2023-02-24 DIAGNOSIS — Z3482 Encounter for supervision of other normal pregnancy, second trimester: Secondary | ICD-10-CM | POA: Insufficient documentation

## 2023-02-24 DIAGNOSIS — Z3480 Encounter for supervision of other normal pregnancy, unspecified trimester: Secondary | ICD-10-CM

## 2023-02-24 DIAGNOSIS — Z3A17 17 weeks gestation of pregnancy: Secondary | ICD-10-CM

## 2023-02-26 ENCOUNTER — Other Ambulatory Visit: Payer: Self-pay | Admitting: Licensed Practical Nurse

## 2023-02-26 DIAGNOSIS — O0992 Supervision of high risk pregnancy, unspecified, second trimester: Secondary | ICD-10-CM

## 2023-02-28 ENCOUNTER — Encounter: Payer: Self-pay | Admitting: Obstetrics and Gynecology

## 2023-02-28 ENCOUNTER — Ambulatory Visit (INDEPENDENT_AMBULATORY_CARE_PROVIDER_SITE_OTHER): Payer: No Typology Code available for payment source | Admitting: Obstetrics and Gynecology

## 2023-02-28 VITALS — BP 112/63 | HR 80 | Wt 137.3 lb

## 2023-02-28 DIAGNOSIS — O10919 Unspecified pre-existing hypertension complicating pregnancy, unspecified trimester: Secondary | ICD-10-CM

## 2023-02-28 DIAGNOSIS — O0992 Supervision of high risk pregnancy, unspecified, second trimester: Secondary | ICD-10-CM

## 2023-02-28 DIAGNOSIS — O10012 Pre-existing essential hypertension complicating pregnancy, second trimester: Secondary | ICD-10-CM

## 2023-02-28 DIAGNOSIS — I10 Essential (primary) hypertension: Secondary | ICD-10-CM

## 2023-02-28 DIAGNOSIS — Z3480 Encounter for supervision of other normal pregnancy, unspecified trimester: Secondary | ICD-10-CM

## 2023-02-28 DIAGNOSIS — Z3A20 20 weeks gestation of pregnancy: Secondary | ICD-10-CM

## 2023-02-28 LAB — POCT URINALYSIS DIPSTICK OB
Bilirubin, UA: NEGATIVE
Blood, UA: NEGATIVE
Glucose, UA: NEGATIVE
Ketones, UA: NEGATIVE
Leukocytes, UA: NEGATIVE
Nitrite, UA: NEGATIVE
POC,PROTEIN,UA: NEGATIVE
Spec Grav, UA: 1.015 (ref 1.010–1.025)
Urobilinogen, UA: 0.2 E.U./dL
pH, UA: 6.5 (ref 5.0–8.0)

## 2023-02-28 NOTE — Progress Notes (Signed)
ROB: Patient is a 27 y.o. G2P1001 at 75w0dwho presents for routine OB care. Pregnancy complicated by h/o anxiety on Zoloft, and CHTN, on Labetalol.  Notes on anatomy scan, unable to see the face, and anterior placenta.  Thinks this is why she is not feeling as much movement yet. Reports occasional bouts of nausea but otherwise feeling good.  To be scheduled for f/u anatomy to complete facial profile. RTC in 4 weeks.

## 2023-02-28 NOTE — Progress Notes (Signed)
ROB [redacted]w[redacted]d She is doing well. She has concerns about the anterior placenta causing decreased movement.

## 2023-03-12 ENCOUNTER — Ambulatory Visit: Payer: PRIVATE HEALTH INSURANCE

## 2023-03-12 ENCOUNTER — Ambulatory Visit
Admission: RE | Admit: 2023-03-12 | Discharge: 2023-03-12 | Disposition: A | Discharge status: Home / Self Care | Payer: No Typology Code available for payment source | Source: Ambulatory Visit | Attending: Licensed Practical Nurse | Admitting: Licensed Practical Nurse

## 2023-03-12 ENCOUNTER — Other Ambulatory Visit: Payer: Self-pay | Admitting: Internal Medicine

## 2023-03-12 ENCOUNTER — Encounter: Payer: Self-pay | Admitting: Internal Medicine

## 2023-03-12 DIAGNOSIS — O0992 Supervision of high risk pregnancy, unspecified, second trimester: Secondary | ICD-10-CM | POA: Insufficient documentation

## 2023-03-13 ENCOUNTER — Telehealth: Payer: Self-pay | Admitting: Internal Medicine

## 2023-03-13 ENCOUNTER — Ambulatory Visit: Payer: PRIVATE HEALTH INSURANCE

## 2023-03-13 NOTE — Telephone Encounter (Signed)
Prescription Request  03/13/2023  LOV: 01/28/2022  What is the name of the medication or equipment?   sertraline (ZOLOFT) 100 MG tablet  labetalol (NORMODYNE) 100 MG tablet  Have you contacted your pharmacy to request a refill? Yes   Which pharmacy would you like this sent to?  [id number] [place] [address] [place] [address] Phone: [phone] Fax: [phone]    Patient notified that their request is being sent to the clinical staff for review and that they should receive a response within 2 business days.   Please advise at Mobile [phone] (mobile)   Patient scheduled OV for 04/09/2023

## 2023-03-14 ENCOUNTER — Other Ambulatory Visit: Payer: Self-pay

## 2023-03-14 ENCOUNTER — Other Ambulatory Visit: Payer: Self-pay | Admitting: Internal Medicine

## 2023-03-14 MED ORDER — SERTRALINE HCL 100 MG PO TABS: 100.0000 mg | ORAL_TABLET | Freq: Every day | ORAL | 0 refills | Status: DC

## 2023-03-14 MED ORDER — LABETALOL HCL 100 MG PO TABS: 100.0000 mg | ORAL_TABLET | Freq: Two times a day (BID) | ORAL | 0 refills | Status: DC

## 2023-03-14 NOTE — Telephone Encounter (Signed)
Refill sent and patient notified

## 2023-03-17 MED ORDER — LABETALOL HCL 100 MG PO TABS: 100.0000 mg | ORAL_TABLET | Freq: Two times a day (BID) | ORAL | 0 refills | Status: DC

## 2023-03-17 MED ORDER — SERTRALINE HCL 100 MG PO TABS: 100.0000 mg | ORAL_TABLET | Freq: Every day | ORAL | 0 refills | Status: DC

## 2023-03-17 NOTE — Progress Notes (Signed)
Transmission to pharmacy failed (03/14/2023  9:26 Resent to pof../l,mb

## 2023-03-17 NOTE — Addendum Note (Signed)
Addended by: Earnstine Regal on: 03/17/2023 03:00 PM   Modules accepted: Orders

## 2023-04-03 ENCOUNTER — Ambulatory Visit (INDEPENDENT_AMBULATORY_CARE_PROVIDER_SITE_OTHER): Payer: No Typology Code available for payment source | Admitting: Obstetrics

## 2023-04-03 ENCOUNTER — Encounter: Payer: Self-pay | Admitting: Obstetrics

## 2023-04-03 VITALS — BP 117/75 | HR 83 | Wt 143.8 lb

## 2023-04-03 DIAGNOSIS — Z3A25 25 weeks gestation of pregnancy: Secondary | ICD-10-CM

## 2023-04-03 DIAGNOSIS — O0992 Supervision of high risk pregnancy, unspecified, second trimester: Secondary | ICD-10-CM

## 2023-04-03 LAB — POCT URINALYSIS DIPSTICK OB
Bilirubin, UA: NEGATIVE
Blood, UA: NEGATIVE
Glucose, UA: NEGATIVE
Ketones, UA: NEGATIVE
Leukocytes, UA: NEGATIVE
Nitrite, UA: NEGATIVE
POC,PROTEIN,UA: NEGATIVE
Spec Grav, UA: 1.02 (ref 1.010–1.025)
Urobilinogen, UA: 0.2 E.U./dL
pH, UA: 6 (ref 5.0–8.0)

## 2023-04-03 NOTE — Progress Notes (Signed)
Routine Prenatal Care Visit  Subjective  Dana Hood is a 27 y.o. G2P1001 at [redacted]w[redacted]d being seen today for ongoing prenatal care.  She is currently monitored for the following issues for this high-risk pregnancy and has Migraines; Allergic urticaria history of and currently resolved ; Anxiety; GERD (gastroesophageal reflux disease); Chronic hypertension; Rubella non-immune status, antepartum; Chronic hypertension affecting pregnancy; and Pregnancy, supervision, high-risk on their problem list.  ----------------------------------------------------------------------------------- Patient reports no complaints.   Contractions: Irritability. Vag. Bleeding: None.  Movement: Present. Leaking Fluid denies.  ----------------------------------------------------------------------------------- The following portions of the patient's history were reviewed and updated as appropriate: allergies, current medications, past family history, past medical history, past social history, past surgical history and problem list. Problem list updated.  Objective  Blood pressure 117/75, pulse 83, weight 143 lb 12.8 oz (65.2 kg), last menstrual period 10/04/2022. Pregravid weight 130 lb (59 kg) Total Weight Gain 13 lb 12.8 oz (6.26 kg) Urinalysis: Urine Protein    Urine Glucose    Fetal Status: Fetal Heart Rate (bpm): 145 Fundal Height: 26 cm Movement: Present     General:  Alert, oriented and cooperative. Patient is in no acute distress.  Skin: Skin is warm and dry. No rash noted.   Cardiovascular: Normal heart rate noted  Respiratory: Normal respiratory effort, no problems with respiration noted  Abdomen: Soft, gravid, appropriate for gestational age. Pain/Pressure: Present     Pelvic:  Cervical exam deferred        Extremities: Normal range of motion.  Edema: None  Mental Status: Normal mood and affect. Normal behavior. Normal judgment and thought content.   Assessment   27 y.o. G2P1001 at [redacted]w[redacted]d by   07/11/2023, by Last Menstrual Period presenting for routine prenatal visit  Plan   second Problems (from 12/04/22 to present)     No problems associated with this episode.        Preterm labor symptoms and general obstetric precautions including but not limited to vaginal bleeding, contractions, leaking of fluid and fetal movement were reviewed in detail with the patient. Please refer to After Visit Summary for other counseling recommendations.  Discussed her 28 week labs for next visit.  No follow-ups on file.  Mirna Mires, CNM  04/03/2023 4:59 PM

## 2023-04-06 ENCOUNTER — Other Ambulatory Visit: Payer: Self-pay | Admitting: Internal Medicine

## 2023-04-08 ENCOUNTER — Encounter: Payer: Self-pay | Admitting: Internal Medicine

## 2023-04-08 NOTE — Patient Instructions (Addendum)
      Blood work was ordered.   The lab is on the first floor.    Medications changes include :   none     Return in about 1 year (around 04/08/2024) for Physical Exam.

## 2023-04-08 NOTE — Progress Notes (Unsigned)
Subjective:    Patient ID: Dana Hood, female    DOB: February 28, 1996, 28 y.o.   MRN: [id number]     HPI Dana Hood is here for follow up of her chronic medical problems.  Currently pregnant with her second child.  She is doing well and has no concerns.  Feels her medication is working well.  Medications and allergies reviewed with patient and updated if appropriate.  Current Outpatient Medications on File Prior to Visit  Medication Sig Dispense Refill   aspirin EC 81 MG tablet Take 81 mg by mouth daily. Swallow whole.     cetirizine (ZYRTEC) 10 MG tablet Take 1 tablet (10 mg total) by mouth daily. 90 tablet 1   EPINEPHrine (EPIPEN 2-PAK) 0.3 mg/0.3 mL IJ SOAJ injection Inject 0.3 mg into the muscle as needed for anaphylaxis. Dispense Mylan or Teva brand 2 each 1   ipratropium (ATROVENT) 0.03 % nasal spray Place 2 sprays into both nostrils 3 (three) times daily as needed for rhinitis. 30 mL 0   labetalol (NORMODYNE) 100 MG tablet Take 1 tablet (100 mg total) by mouth 2 (two) times daily. Follow up appt needed 60 tablet 0   prenatal vitamin w/FE, FA (NATACHEW) 29-1 MG CHEW chewable tablet Chew 2 tablets by mouth daily at 12 noon.     sertraline (ZOLOFT) 100 MG tablet Take 1 tablet (100 mg total) by mouth daily. 30 tablet 0   ondansetron (ZOFRAN-ODT) 4 MG disintegrating tablet Take 1 tablet (4 mg total) by mouth every 6 (six) hours as needed for nausea. (Patient not taking: Reported on 04/09/2023) 20 tablet 0   No current facility-administered medications on file prior to visit.     Review of Systems  Constitutional:  Negative for fever.  Respiratory:  Negative for cough, shortness of breath and wheezing.   Cardiovascular:  Negative for chest pain, palpitations and leg swelling.  Neurological:  Positive for light-headedness (one episode). Negative for headaches.       Objective:   Vitals:   04/09/23 1017  BP: 122/78  Pulse: 83  Temp: 98.1 F (36.7 C)  SpO2: 100%    BP Readings from Last 3 Encounters:  04/09/23 122/78  04/03/23 117/75  02/28/23 112/63   Wt Readings from Last 3 Encounters:  04/09/23 143 lb (64.9 kg)  04/03/23 143 lb 12.8 oz (65.2 kg)  02/28/23 137 lb 4.8 oz (62.3 kg)   Body mass index is 27.02 kg/m.    Physical Exam Constitutional:      General: She is not in acute distress.    Appearance: Normal appearance.  HENT:     Head: Normocephalic and atraumatic.  Eyes:     Conjunctiva/sclera: Conjunctivae normal.  Cardiovascular:     Rate and Rhythm: Normal rate and regular rhythm.     Heart sounds: Normal heart sounds.  Pulmonary:     Effort: Pulmonary effort is normal. No respiratory distress.     Breath sounds: Normal breath sounds. No wheezing.  Musculoskeletal:     Cervical back: Neck supple.     Right lower leg: No edema.     Left lower leg: No edema.  Lymphadenopathy:     Cervical: No cervical adenopathy.  Skin:    General: Skin is warm and dry.     Findings: No rash.  Neurological:     Mental Status: She is alert. Mental status is at baseline.  Psychiatric:        Mood and Affect: Mood normal.  Behavior: Behavior normal.        Lab Results  Component Value Date   WBC 5.9 12/24/2022   HGB 12.6 12/24/2022   HCT 37.4 12/24/2022   PLT 282 12/24/2022   GLUCOSE 79 11/09/2020   CHOL 200 (H) 04/17/2020   TRIG 97 04/17/2020   HDL 62 04/17/2020   LDLCALC 121 (H) 04/17/2020   ALT 13 11/09/2020   AST 20 11/09/2020   NA 139 11/09/2020   K 4.0 11/09/2020   CL 105 11/09/2020   CREATININE 0.57 11/09/2020   BUN 5 (L) 11/09/2020   CO2 22 11/09/2020   TSH 2.150 04/17/2020     Assessment & Plan:    See Problem List for Assessment and Plan of chronic medical problems.

## 2023-04-09 ENCOUNTER — Ambulatory Visit (INDEPENDENT_AMBULATORY_CARE_PROVIDER_SITE_OTHER): Payer: No Typology Code available for payment source | Admitting: Internal Medicine

## 2023-04-09 VITALS — BP 122/78 | HR 83 | Temp 98.1°F | Ht 61.0 in | Wt 143.0 lb

## 2023-04-09 DIAGNOSIS — Z3A00 Weeks of gestation of pregnancy not specified: Secondary | ICD-10-CM

## 2023-04-09 DIAGNOSIS — I10 Essential (primary) hypertension: Secondary | ICD-10-CM

## 2023-04-09 DIAGNOSIS — K219 Gastro-esophageal reflux disease without esophagitis: Secondary | ICD-10-CM | POA: Diagnosis not present

## 2023-04-09 DIAGNOSIS — F419 Anxiety disorder, unspecified: Secondary | ICD-10-CM

## 2023-04-09 DIAGNOSIS — O0992 Supervision of high risk pregnancy, unspecified, second trimester: Secondary | ICD-10-CM

## 2023-04-09 LAB — COMPREHENSIVE METABOLIC PANEL
ALT: 14 U/L (ref 0–35)
AST: 21 U/L (ref 0–37)
Albumin: 3.8 g/dL (ref 3.5–5.2)
Alkaline Phosphatase: 82 U/L (ref 39–117)
BUN: 5 mg/dL — ABNORMAL LOW (ref 6–23)
CO2: 26 mEq/L (ref 19–32)
Calcium: 8.9 mg/dL (ref 8.4–10.5)
Chloride: 102 mEq/L (ref 96–112)
Creatinine, Ser: 0.53 mg/dL (ref 0.40–1.20)
GFR: 126.49 mL/min (ref >60.00)
Glucose, Bld: 72 mg/dL (ref 70–99)
Potassium: 4.2 mEq/L (ref 3.5–5.1)
Sodium: 134 mEq/L — ABNORMAL LOW (ref 135–145)
Total Bilirubin: 0.2 mg/dL (ref 0.2–1.2)
Total Protein: 7.2 g/dL (ref 6.0–8.3)

## 2023-04-09 LAB — CBC WITH DIFFERENTIAL/PLATELET
Basophils Absolute: 0.1 10*3/uL (ref 0.0–0.1)
Basophils Relative: 0.9 % (ref 0.0–3.0)
Eosinophils Absolute: 0.4 10*3/uL (ref 0.0–0.7)
Eosinophils Relative: 4.6 % (ref 0.0–5.0)
HCT: 32.8 % — ABNORMAL LOW (ref 36.0–46.0)
Hemoglobin: 10.9 g/dL — ABNORMAL LOW (ref 12.0–15.0)
Lymphocytes Relative: 16.6 % (ref 12.0–46.0)
Lymphs Abs: 1.4 10*3/uL (ref 0.7–4.0)
MCHC: 33.3 g/dL (ref 30.0–36.0)
MCV: 87 fl (ref 78.0–100.0)
Monocytes Absolute: 0.9 10*3/uL (ref 0.1–1.0)
Monocytes Relative: 10.7 % (ref 3.0–12.0)
Neutro Abs: 5.5 10*3/uL (ref 1.4–7.7)
Neutrophils Relative %: 67.2 % (ref 43.0–77.0)
Platelets: 308 10*3/uL (ref 150.0–400.0)
RBC: 3.77 Mil/uL — ABNORMAL LOW (ref 3.87–5.11)
RDW: 12.5 % (ref 11.5–15.5)
WBC: 8.2 10*3/uL (ref 4.0–10.5)

## 2023-04-09 LAB — IBC PANEL
Iron: 50 ug/dL (ref 42–145)
Saturation Ratios: 8 % — ABNORMAL LOW (ref 20.0–50.0)
TIBC: 621.6 ug/dL — ABNORMAL HIGH (ref 250.0–450.0)
Transferrin: 444 mg/dL — ABNORMAL HIGH (ref 212.0–360.0)

## 2023-04-09 LAB — FERRITIN: Ferritin: 5.5 ng/mL — ABNORMAL LOW (ref 10.0–291.0)

## 2023-04-09 MED ORDER — LABETALOL HCL 100 MG PO TABS: 100.0000 mg | ORAL_TABLET | Freq: Two times a day (BID) | ORAL | 3 refills | Status: DC

## 2023-04-09 MED ORDER — SERTRALINE HCL 100 MG PO TABS: 100.0000 mg | ORAL_TABLET | Freq: Every day | ORAL | 3 refills | Status: DC

## 2023-04-09 NOTE — Assessment & Plan Note (Addendum)
Chronic Blood pressure well-controlled Continue labetalol 100 mg bid Continue to monitor BP at home

## 2023-04-09 NOTE — Assessment & Plan Note (Signed)
Chronic GERD controlled Continue pepcid 20 mg twice a day

## 2023-04-09 NOTE — Assessment & Plan Note (Signed)
Chronic Controlled, stable Continue  sertraline 100 mg daily 

## 2023-04-24 ENCOUNTER — Other Ambulatory Visit: Payer: No Typology Code available for payment source

## 2023-04-24 ENCOUNTER — Encounter: Payer: Self-pay | Admitting: Obstetrics

## 2023-04-24 ENCOUNTER — Ambulatory Visit (INDEPENDENT_AMBULATORY_CARE_PROVIDER_SITE_OTHER): Payer: No Typology Code available for payment source | Admitting: Obstetrics

## 2023-04-24 ENCOUNTER — Encounter: Payer: Self-pay | Admitting: Certified Nurse Midwife

## 2023-04-24 VITALS — BP 117/69 | HR 89 | Wt 147.0 lb

## 2023-04-24 DIAGNOSIS — Z3A28 28 weeks gestation of pregnancy: Secondary | ICD-10-CM

## 2023-04-24 DIAGNOSIS — O10919 Unspecified pre-existing hypertension complicating pregnancy, unspecified trimester: Secondary | ICD-10-CM

## 2023-04-24 DIAGNOSIS — O0993 Supervision of high risk pregnancy, unspecified, third trimester: Secondary | ICD-10-CM

## 2023-04-24 DIAGNOSIS — O10013 Pre-existing essential hypertension complicating pregnancy, third trimester: Secondary | ICD-10-CM

## 2023-04-24 DIAGNOSIS — O0992 Supervision of high risk pregnancy, unspecified, second trimester: Secondary | ICD-10-CM

## 2023-04-24 NOTE — Progress Notes (Signed)
Subjective:    Dana Hood is a 27 y.o. G2P1001 [redacted]w[redacted]d being seen today for her obstetrical visit.  Patient reports no complaints. Fetal movement: normal.  Objective:    BP 117/69   Pulse 89   Wt 147 lb (66.7 kg)   LMP 10/04/2022 (Exact Date)   BMI 27.78 kg/m   Physical Exam Vitals and nursing note reviewed.  Constitutional:      Appearance: Normal appearance.  Cardiovascular:     Rate and Rhythm: Normal rate.  Pulmonary:     Effort: Pulmonary effort is normal.  Skin:    General: Skin is warm and dry.  Neurological:     General: No focal deficit present.     Mental Status: She is alert and oriented to person, place, and time.  Psychiatric:        Mood and Affect: Mood normal.        Behavior: Behavior normal.       FHT: Fetal Heart Rate (bpm): 130  Uterine Size: Fundal Height: 29 cm     Assessment:    Pregnancy:  G2P1001 at [redacted]w[redacted]d. Anemic on labs drawn at PCP, started PNV with iron, reviewed options for plant based iron supplementation. Third trimester labs today. TDAP deferred given Alpha Gal, boostrix contains bovine extract.     Plan:    Patient Active Problem List   Diagnosis Date Noted   Pregnancy, supervision, high-risk 12/04/2022   Chronic hypertension affecting pregnancy    Rubella non-immune status, antepartum 05/12/2020   Anxiety 10/26/2019   GERD (gastroesophageal reflux disease) 10/26/2019   Chronic hypertension 10/26/2019   Allergic urticaria history of and currently resolved  09/21/2019   History of migraine 01/30/2017    Follow up in 3w. Begin NSTs at 32w.

## 2023-04-25 ENCOUNTER — Encounter: Payer: Self-pay | Admitting: Obstetrics

## 2023-04-25 LAB — 28 WEEK RH+PANEL
Basophils Absolute: 0 10*3/uL (ref 0.0–0.2)
Basos: 1 %
EOS (ABSOLUTE): 0.3 10*3/uL (ref 0.0–0.4)
Eos: 3 %
Gestational Diabetes Screen: 109 mg/dL (ref 70–139)
HIV Screen 4th Generation wRfx: NONREACTIVE
Hematocrit: 30.8 % — ABNORMAL LOW (ref 34.0–46.6)
Hemoglobin: 9.9 g/dL — ABNORMAL LOW (ref 11.1–15.9)
Immature Grans (Abs): 0 10*3/uL (ref 0.0–0.1)
Immature Granulocytes: 0 %
Lymphocytes Absolute: 1.2 10*3/uL (ref 0.7–3.1)
Lymphs: 14 %
MCH: 27.5 pg (ref 26.6–33.0)
MCHC: 32.1 g/dL (ref 31.5–35.7)
MCV: 86 fL (ref 79–97)
Monocytes Absolute: 0.8 10*3/uL (ref 0.1–0.9)
Monocytes: 9 %
Neutrophils Absolute: 6.3 10*3/uL (ref 1.4–7.0)
Neutrophils: 73 %
Platelets: 286 10*3/uL (ref 150–450)
RBC: 3.6 x10E6/uL — ABNORMAL LOW (ref 3.77–5.28)
RDW: 11.7 % (ref 11.7–15.4)
RPR Ser Ql: NONREACTIVE
WBC: 8.7 10*3/uL (ref 3.4–10.8)

## 2023-04-28 ENCOUNTER — Encounter: Payer: Self-pay | Admitting: Obstetrics

## 2023-05-13 DIAGNOSIS — Z3A31 31 weeks gestation of pregnancy: Secondary | ICD-10-CM | POA: Insufficient documentation

## 2023-05-13 NOTE — Patient Instructions (Signed)

## 2023-05-13 NOTE — Progress Notes (Unsigned)
    NURSE VISIT NOTE  Subjective:    Patient ID: Dana Hood, female    DOB: 1996/03/08, 27 y.o.   MRN: [id number]  HPI  Patient is a 27 y.o. G2P1001 female who presents for fetal monitoring per order from Guadlupe Spanish, CNM.   Objective:    LMP 10/04/2022 (Exact Date)  Estimated Date of Delivery: 07/11/23  [redacted]w[redacted]d  Fetus A Non-Stress Test Interpretation for 05/13/23  Indication: Chronic Hypertenstion            Assessment:   1. Maternal chronic hypertension, third trimester   2. [redacted] weeks gestation of pregnancy      Plan:   Results reviewed and discussed with patient by  Paula Compton, CNM.     Rocco Serene, LPN

## 2023-05-14 ENCOUNTER — Ambulatory Visit (INDEPENDENT_AMBULATORY_CARE_PROVIDER_SITE_OTHER): Payer: No Typology Code available for payment source

## 2023-05-14 ENCOUNTER — Ambulatory Visit (INDEPENDENT_AMBULATORY_CARE_PROVIDER_SITE_OTHER): Payer: No Typology Code available for payment source | Admitting: Obstetrics

## 2023-05-14 VITALS — BP 113/75 | HR 91 | Wt 147.8 lb

## 2023-05-14 VITALS — BP 113/75 | HR 91 | Ht 61.0 in | Wt 147.8 lb

## 2023-05-14 DIAGNOSIS — O10013 Pre-existing essential hypertension complicating pregnancy, third trimester: Secondary | ICD-10-CM

## 2023-05-14 DIAGNOSIS — O10919 Unspecified pre-existing hypertension complicating pregnancy, unspecified trimester: Secondary | ICD-10-CM

## 2023-05-14 DIAGNOSIS — Z3A31 31 weeks gestation of pregnancy: Secondary | ICD-10-CM

## 2023-05-14 DIAGNOSIS — O10913 Unspecified pre-existing hypertension complicating pregnancy, third trimester: Secondary | ICD-10-CM

## 2023-05-14 DIAGNOSIS — O0993 Supervision of high risk pregnancy, unspecified, third trimester: Secondary | ICD-10-CM

## 2023-05-14 LAB — POCT URINALYSIS DIPSTICK OB
Bilirubin, UA: NEGATIVE
Blood, UA: NEGATIVE
Glucose, UA: NEGATIVE
Ketones, UA: NEGATIVE
Leukocytes, UA: NEGATIVE
Nitrite, UA: NEGATIVE
POC,PROTEIN,UA: NEGATIVE
Spec Grav, UA: <=1.005 — AB (ref 1.010–1.025)
Urobilinogen, UA: 0.2 E.U./dL
pH, UA: 5 (ref 5.0–8.0)

## 2023-05-14 NOTE — Progress Notes (Signed)
Routine Prenatal Care Visit  Subjective  Dana Hood is a 27 y.o. G2P1001 at [redacted]w[redacted]d being seen today for ongoing prenatal care.  She is currently monitored for the following issues for this high-risk pregnancy and has History of migraine; Allergic urticaria history of and currently resolved ; Anxiety; GERD (gastroesophageal reflux disease); Chronic hypertension; Rubella non-immune status, antepartum; Maternal chronic hypertension, third trimester; Pregnancy, supervision, high-risk; and [redacted] weeks gestation of pregnancy on their problem list.  ----------------------------------------------------------------------------------- Patient reports no complaints.   Contractions: Irritability. Vag. Bleeding: None.  Movement: Present. Leaking Fluid denies.  ----------------------------------------------------------------------------------- The following portions of the patient's history were reviewed and updated as appropriate: allergies, current medications, past family history, past medical history, past social history, past surgical history and problem list. Problem list updated.  Objective  Blood pressure 113/75, pulse 91, weight 147 lb 12.8 oz (67 kg), last menstrual period 10/04/2022. Pregravid weight 130 lb (59 kg) Total Weight Gain 17 lb 12.8 oz (8.074 kg) Urinalysis: Urine Protein Negative  Urine Glucose Negative  Fetal Status:     Movement: Present     General:  Alert, oriented and cooperative. Patient is in no acute distress.  Skin: Skin is warm and dry. No rash noted.   Cardiovascular: Normal heart rate noted  Respiratory: Normal respiratory effort, no problems with respiration noted  Abdomen: Soft, gravid, appropriate for gestational age. Pain/Pressure: Present     Pelvic:  Cervical exam deferred        Extremities: Normal range of motion.  Edema: None  Mental Status: Normal mood and affect. Normal behavior. Normal judgment and thought content.   Assessment   27 y.o. G2P1001 at  [redacted]w[redacted]d by  07/11/2023, by Last Menstrual Period presenting for routine prenatal visit Chronic Hypertension- on Labetalol  Plan   second Problems (from 12/04/22 to present)     No problems associated with this episode.        Preterm labor symptoms and general obstetric precautions including but not limited to vaginal bleeding, contractions, leaking of fluid and fetal movement were reviewed in detail with the patient. Please refer to After Visit Summary for other counseling recommendations.   Return in about 2 weeks (around 05/28/2023) for return OB.  Mirna Mires, CNM  05/14/2023 1:01 PM

## 2023-05-20 ENCOUNTER — Encounter: Payer: Self-pay | Admitting: Obstetrics

## 2023-05-20 DIAGNOSIS — Z3A32 32 weeks gestation of pregnancy: Secondary | ICD-10-CM | POA: Insufficient documentation

## 2023-05-20 NOTE — Patient Instructions (Signed)

## 2023-05-20 NOTE — Progress Notes (Unsigned)
    NURSE VISIT NOTE  Subjective:    Patient ID: Dana Hood, female    DOB: August 05, 1996, 27 y.o.   MRN: [id number]  HPI  Patient is a 27 y.o. G42P1001 female who presents for fetal monitoring per order from Paula Compton, CNM.   Objective:    LMP 10/04/2022 (Exact Date)  Estimated Date of Delivery: 07/11/23  [redacted]w[redacted]d  Fetus A Non-Stress Test Interpretation for 05/20/23  Indication: Chronic Hypertenstion            Assessment:   1. Maternal chronic hypertension, third trimester   2. [redacted] weeks gestation of pregnancy      Plan:   Results reviewed and discussed with patient by  {[email or web address]:[id number]}.     Rocco Serene, LPN

## 2023-05-21 ENCOUNTER — Ambulatory Visit (INDEPENDENT_AMBULATORY_CARE_PROVIDER_SITE_OTHER): Payer: No Typology Code available for payment source

## 2023-05-21 VITALS — BP 121/81 | HR 92 | Ht 61.0 in | Wt 144.1 lb

## 2023-05-21 DIAGNOSIS — Z3A32 32 weeks gestation of pregnancy: Secondary | ICD-10-CM

## 2023-05-21 DIAGNOSIS — O10013 Pre-existing essential hypertension complicating pregnancy, third trimester: Secondary | ICD-10-CM | POA: Diagnosis not present

## 2023-05-21 DIAGNOSIS — O10913 Unspecified pre-existing hypertension complicating pregnancy, third trimester: Secondary | ICD-10-CM

## 2023-05-22 ENCOUNTER — Encounter: Payer: Self-pay | Admitting: Obstetrics

## 2023-05-22 DIAGNOSIS — Z3A33 33 weeks gestation of pregnancy: Secondary | ICD-10-CM | POA: Insufficient documentation

## 2023-05-22 NOTE — Progress Notes (Signed)
    NURSE VISIT NOTE  Subjective:    Patient ID: Dana Hood, female    DOB: Aug 23, 1996, 27 y.o.   MRN: [id number]  HPI  Patient is a 27 y.o. G40P1001 female who presents for fetal monitoring per order from Paula Compton, CNM.   Objective:    BP 124/77   Pulse 86   Ht 5\' 1"  (1.549 m)   Wt 149 lb 11.2 oz (67.9 kg)   LMP 10/04/2022 (Exact Date)   BMI 28.29 kg/m  Estimated Date of Delivery: 07/11/23  [redacted]w[redacted]d  Fetus A Non-Stress Test Interpretation for 05/26/23  Indication: Chronic Hypertenstion  Fetal Heart Rate A Mode: External Baseline Rate (A): 140 bpm Variability: Moderate Accelerations: 15 x 15 Decelerations: None Multiple birth?: No  Uterine Activity Mode: Toco Contraction Frequency (min): ui  Interpretation (Fetal Testing) Nonstress Test Interpretation: Reactive Overall Impression: Reassuring for gestational age   Assessment:   1. Maternal chronic hypertension, third trimester   2. [redacted] weeks gestation of pregnancy      Plan:   Results reviewed and discussed with patient by  Paula Compton, CNM.     Rocco Serene, LPN

## 2023-05-22 NOTE — Patient Instructions (Signed)

## 2023-05-26 ENCOUNTER — Ambulatory Visit (INDEPENDENT_AMBULATORY_CARE_PROVIDER_SITE_OTHER): Payer: No Typology Code available for payment source

## 2023-05-26 ENCOUNTER — Ambulatory Visit (INDEPENDENT_AMBULATORY_CARE_PROVIDER_SITE_OTHER): Payer: No Typology Code available for payment source | Admitting: Obstetrics

## 2023-05-26 VITALS — BP 124/77 | HR 86 | Ht 61.0 in | Wt 149.7 lb

## 2023-05-26 VITALS — BP 124/77 | HR 86 | Wt 149.7 lb

## 2023-05-26 DIAGNOSIS — O0993 Supervision of high risk pregnancy, unspecified, third trimester: Secondary | ICD-10-CM

## 2023-05-26 DIAGNOSIS — O10013 Pre-existing essential hypertension complicating pregnancy, third trimester: Secondary | ICD-10-CM | POA: Diagnosis not present

## 2023-05-26 DIAGNOSIS — O10913 Unspecified pre-existing hypertension complicating pregnancy, third trimester: Secondary | ICD-10-CM

## 2023-05-26 DIAGNOSIS — Z3A33 33 weeks gestation of pregnancy: Secondary | ICD-10-CM | POA: Diagnosis not present

## 2023-05-26 DIAGNOSIS — O133 Gestational [pregnancy-induced] hypertension without significant proteinuria, third trimester: Secondary | ICD-10-CM

## 2023-05-26 NOTE — Progress Notes (Signed)
Routine Prenatal Care Visit  Subjective  Dana Hood is a 27 y.o. G2P1001 at [redacted]w[redacted]d being seen today for ongoing prenatal care.  She is currently monitored for the following issues for this high-risk pregnancy and has History of migraine; Allergic urticaria history of and currently resolved ; Anxiety; GERD (gastroesophageal reflux disease); Chronic hypertension; Rubella non-immune status, antepartum; Maternal chronic hypertension, third trimester; and Pregnancy, supervision, high-risk on their problem list.  ----------------------------------------------------------------------------------- Patient reports no complaints.  Her baby is moving well, and she denies any headache today, visual changes. Had a reactive NST today as well. Contractions: Irritability. Vag. Bleeding: None.  Movement: Present. Leaking Fluid denies.  ----------------------------------------------------------------------------------- The following portions of the patient's history were reviewed and updated as appropriate: allergies, current medications, past family history, past medical history, past social history, past surgical history and problem list. Problem list updated.  Objective  Blood pressure 124/77, pulse 86, weight 149 lb 11.2 oz (67.9 kg), last menstrual period 10/04/2022. Pregravid weight 130 lb (59 kg) Total Weight Gain 19 lb 11.2 oz (8.936 kg) Urinalysis: Urine Protein    Urine Glucose    Fetal Status: Fetal Heart Rate (bpm): 140   Movement: Present     General:  Alert, oriented and cooperative. Patient is in no acute distress.  Skin: Skin is warm and dry. No rash noted.   Cardiovascular: Normal heart rate noted  Respiratory: Normal respiratory effort, no problems with respiration noted  Abdomen: Soft, gravid, appropriate for gestational age. Pain/Pressure: Present (intermittent pressure)     Pelvic:  Cervical exam deferred        Extremities: Normal range of motion.  Edema: None  Mental Status:  Normal mood and affect. Normal behavior. Normal judgment and thought content.   Assessment   27 y.o. G2P1001 at [redacted]w[redacted]d by  07/11/2023, by Last Menstrual Period presenting for routine prenatal visit Hypertension in pregnancy. Reactive NST Plan   second Problems (from 12/04/22 to present)     No problems associated with this episode.        Preterm labor symptoms and general obstetric precautions including but not limited to vaginal bleeding, contractions, leaking of fluid and fetal movement were reviewed in detail with the patient. Please refer to After Visit Summary for other counseling recommendations.  She will continue with weekly NSTs. ROB in 1-2 weeks.  Return in about 2 weeks (around 06/09/2023) for return OB, weekly NSTs.  Mirna Mires, CNM  05/26/2023 5:19 PM

## 2023-05-27 ENCOUNTER — Encounter: Payer: Self-pay | Admitting: Obstetrics

## 2023-05-28 ENCOUNTER — Other Ambulatory Visit: Payer: No Typology Code available for payment source

## 2023-05-28 ENCOUNTER — Encounter: Payer: No Typology Code available for payment source | Admitting: Obstetrics

## 2023-05-30 ENCOUNTER — Telehealth: Payer: Self-pay

## 2023-05-30 DIAGNOSIS — O10913 Unspecified pre-existing hypertension complicating pregnancy, third trimester: Secondary | ICD-10-CM

## 2023-05-30 NOTE — Telephone Encounter (Signed)
Left voicemail to notify patient she is due for an ultrasound to check babies growth. Advised to return call to schedule. Per [facility], can be seen 6/12 or 6/13 @1030  or 11:30

## 2023-06-02 NOTE — Telephone Encounter (Signed)
The patient is scheduled for imaging 6/13 at [facility]

## 2023-06-03 DIAGNOSIS — Z3A34 34 weeks gestation of pregnancy: Secondary | ICD-10-CM | POA: Insufficient documentation

## 2023-06-03 NOTE — Patient Instructions (Signed)

## 2023-06-03 NOTE — Progress Notes (Unsigned)
    NURSE VISIT NOTE  Subjective:    Patient ID: Dana Hood, female    DOB: 1996/06/26, 27 y.o.   MRN: [id number]  HPI  Patient is a 27 y.o. G64P1001 female who presents for fetal monitoring per order from Paula Compton, CNM.   Objective:    LMP 10/04/2022 (Exact Date)  Estimated Date of Delivery: 07/11/23  [redacted]w[redacted]d  Fetus A Non-Stress Test Interpretation for 06/03/23  Indication: Chronic Hypertenstion            Assessment:   1. Maternal chronic hypertension, third trimester   2. [redacted] weeks gestation of pregnancy      Plan:   Results reviewed and discussed with patient by  {[email or web address]:[id number]}.     Rocco Serene, LPN

## 2023-06-04 ENCOUNTER — Ambulatory Visit (INDEPENDENT_AMBULATORY_CARE_PROVIDER_SITE_OTHER): Payer: No Typology Code available for payment source

## 2023-06-04 VITALS — BP 124/84 | HR 94 | Ht 61.0 in | Wt 150.0 lb

## 2023-06-04 DIAGNOSIS — O10913 Unspecified pre-existing hypertension complicating pregnancy, third trimester: Secondary | ICD-10-CM

## 2023-06-04 DIAGNOSIS — Z3A34 34 weeks gestation of pregnancy: Secondary | ICD-10-CM

## 2023-06-04 DIAGNOSIS — O10013 Pre-existing essential hypertension complicating pregnancy, third trimester: Secondary | ICD-10-CM | POA: Diagnosis not present

## 2023-06-05 ENCOUNTER — Ambulatory Visit
Admission: RE | Admit: 2023-06-05 | Discharge: 2023-06-05 | Disposition: A | Discharge status: Home / Self Care | Payer: No Typology Code available for payment source | Source: Ambulatory Visit | Attending: Obstetrics and Gynecology | Admitting: Obstetrics and Gynecology

## 2023-06-05 DIAGNOSIS — O10913 Unspecified pre-existing hypertension complicating pregnancy, third trimester: Secondary | ICD-10-CM | POA: Diagnosis present

## 2023-06-09 ENCOUNTER — Other Ambulatory Visit (HOSPITAL_COMMUNITY)
Admission: RE | Admit: 2023-06-09 | Discharge: 2023-06-09 | Disposition: A | Discharge status: Home / Self Care | Payer: No Typology Code available for payment source | Source: Ambulatory Visit | Attending: Obstetrics and Gynecology | Admitting: Obstetrics and Gynecology

## 2023-06-09 ENCOUNTER — Ambulatory Visit (INDEPENDENT_AMBULATORY_CARE_PROVIDER_SITE_OTHER): Payer: No Typology Code available for payment source

## 2023-06-09 ENCOUNTER — Ambulatory Visit (INDEPENDENT_AMBULATORY_CARE_PROVIDER_SITE_OTHER): Payer: No Typology Code available for payment source | Admitting: Obstetrics

## 2023-06-09 VITALS — BP 114/79 | HR 106 | Ht 61.0 in | Wt 150.0 lb

## 2023-06-09 VITALS — Wt 150.0 lb

## 2023-06-09 DIAGNOSIS — O10013 Pre-existing essential hypertension complicating pregnancy, third trimester: Secondary | ICD-10-CM | POA: Diagnosis not present

## 2023-06-09 DIAGNOSIS — Z113 Encounter for screening for infections with a predominantly sexual mode of transmission: Secondary | ICD-10-CM | POA: Insufficient documentation

## 2023-06-09 DIAGNOSIS — Z3A35 35 weeks gestation of pregnancy: Secondary | ICD-10-CM | POA: Diagnosis not present

## 2023-06-09 DIAGNOSIS — O10913 Unspecified pre-existing hypertension complicating pregnancy, third trimester: Secondary | ICD-10-CM

## 2023-06-09 DIAGNOSIS — Z3A36 36 weeks gestation of pregnancy: Secondary | ICD-10-CM

## 2023-06-09 DIAGNOSIS — Z3685 Encounter for antenatal screening for Streptococcus B: Secondary | ICD-10-CM

## 2023-06-09 DIAGNOSIS — O163 Unspecified maternal hypertension, third trimester: Secondary | ICD-10-CM

## 2023-06-09 DIAGNOSIS — O0992 Supervision of high risk pregnancy, unspecified, second trimester: Secondary | ICD-10-CM

## 2023-06-09 LAB — POCT URINALYSIS DIPSTICK OB
Bilirubin, UA: NEGATIVE
Blood, UA: NEGATIVE
Glucose, UA: NEGATIVE
Ketones, UA: NEGATIVE
Leukocytes, UA: NEGATIVE
Nitrite, UA: NEGATIVE
POC,PROTEIN,UA: NEGATIVE
Spec Grav, UA: 1.01 (ref 1.010–1.025)
Urobilinogen, UA: 0.2 E.U./dL
pH, UA: 6.5 (ref 5.0–8.0)

## 2023-06-09 NOTE — Patient Instructions (Signed)

## 2023-06-09 NOTE — Assessment & Plan Note (Signed)
-  Herbal labor prep handout given -GBS and GC/chlamydia swabs self-collected today

## 2023-06-09 NOTE — Progress Notes (Signed)
    NURSE VISIT NOTE  Subjective:    Patient ID: Laekyn Woolford, female    DOB: 1996-05-04, 27 y.o.   MRN: [id number]  HPI  Patient is a 27 y.o. G77P1001 female who presents for fetal monitoring per order from Hartley Barefoot, CNM.   Objective:    BP 114/79   Pulse (!) 106   Ht 5\' 1"  (1.549 m)   Wt 150 lb (68 kg)   LMP 10/04/2022 (Exact Date)   BMI 28.34 kg/m  Estimated Date of Delivery: 07/11/23  [redacted]w[redacted]d  Fetus A Non-Stress Test Interpretation for 06/09/23  Indication: Chronic Hypertenstion  Fetal Heart Rate A Mode: External Baseline Rate (A): 135 bpm Variability: Moderate Accelerations: 15 x 15 Decelerations: None Multiple birth?: No  Uterine Activity Mode: Toco Contraction Frequency (min): None  Interpretation (Fetal Testing) Nonstress Test Interpretation: Reactive Overall Impression: Reassuring for gestational age   Assessment:   1. Maternal chronic hypertension, third trimester   2. [redacted] weeks gestation of pregnancy      Plan:   Results reviewed and discussed with patient by  Guadlupe Spanish, CNM.     Rocco Serene, LPN

## 2023-06-09 NOTE — Assessment & Plan Note (Addendum)
-  RNST today - continue weekly monitoring -Desires IOL at 38 weeks. Discussed timing of delivery based on blood pressure control and cervical ripeness -Taking labetalol and ASA daily -Reviewed normal growth scan on  06/05/23. EFW  35.2%, cephalic presentation

## 2023-06-09 NOTE — Progress Notes (Signed)
    Return Prenatal Note   Assessment/Plan   Plan  27 y.o. G2P1001 at [redacted]w[redacted]d presents for follow-up OB visit. Reviewed prenatal record including previous visit note.  Maternal chronic hypertension, third trimester -RNST today - continue weekly monitoring -Desires IOL at 38 weeks. Discussed timing of delivery based on blood pressure control and cervical ripeness -Taking labetalol and ASA daily -Reviewed normal growth scan on  06/05/23. EFW  35.2%, cephalic presentation  Pregnancy, supervision, high-risk -Herbal labor prep handout given -GBS and GC/chlamydia swabs self-collected today    Orders Placed This Encounter  Procedures   Strep Gp B NAA    Order Specific Question:   Release to patient    Answer:   Immediate [1]   POC Urinalysis Dipstick OB   Return in about 1 week (around 06/16/2023).   Future Appointments  Date Time Provider Department Center  06/18/2023  9:15 AM AOB-NST ROOM [facility] None  06/18/2023 10:15 AM Glenetta Borg, CNM [facility] None  06/25/2023 10:15 AM AOB-NST ROOM [facility] None  06/25/2023 10:55 AM [name], MD [facility] None    For next visit:   ROB with NST. Discuss scheduling IOL     Subjective   27 y.o. G2P1001 at [redacted]w[redacted]d presents for this follow-up prenatal visit.  Catana has been feeling shooting pain down her right side since the baby flipped to vertex. Encouraged stretches, yoga, chiropractor. She is ready for the baby! Denies HA, visual changes, epigastric pain, ctx, LOF, and bloody show.   Patient reports: Movement: Present Contractions: Not present  Objective   Flow sheet Vitals: Fundal Height: 34 cm Fetal Heart Rate (bpm): See NST Total weight gain: 20 lb (9.072 kg)  General Appearance  No acute distress, well appearing, and well nourished Pulmonary   Normal work of breathing Neurologic   Alert and oriented to person, place, and time Psychiatric   Mood and affect within normal limits  Glenetta Borg, CNM  06/09/23 3:34  PM

## 2023-06-11 ENCOUNTER — Encounter: Payer: Self-pay | Admitting: Obstetrics

## 2023-06-11 ENCOUNTER — Encounter: Payer: Self-pay | Admitting: Certified Nurse Midwife

## 2023-06-11 ENCOUNTER — Other Ambulatory Visit: Payer: No Typology Code available for payment source

## 2023-06-11 ENCOUNTER — Encounter: Payer: No Typology Code available for payment source | Admitting: Obstetrics and Gynecology

## 2023-06-11 LAB — CERVICOVAGINAL ANCILLARY ONLY
Chlamydia: NEGATIVE
Comment: NEGATIVE
Comment: NORMAL
Neisseria Gonorrhea: NEGATIVE

## 2023-06-11 LAB — STREP GP B NAA: Strep Gp B NAA: POSITIVE — AB

## 2023-06-13 DIAGNOSIS — Z3A36 36 weeks gestation of pregnancy: Secondary | ICD-10-CM | POA: Insufficient documentation

## 2023-06-13 NOTE — Progress Notes (Unsigned)
    NURSE VISIT NOTE  Subjective:    Patient ID: Dana Hood, female    DOB: 12/17/1996, 27 y.o.   MRN: [id number]  HPI  Patient is a 27 y.o. G68P1001 female who presents for fetal monitoring per order from Guadlupe Spanish, CNM.   Objective:    LMP 10/04/2022 (Exact Date)  Estimated Date of Delivery: 07/11/23  [redacted]w[redacted]d  Fetus A Non-Stress Test Interpretation for 06/13/23  Indication: Chronic Hypertenstion            Assessment:   1. Maternal chronic hypertension, third trimester   2. [redacted] weeks gestation of pregnancy      Plan:   Results reviewed and discussed with patient by  Guadlupe Spanish, CNM.     Rocco Serene, LPN

## 2023-06-13 NOTE — Patient Instructions (Signed)

## 2023-06-18 ENCOUNTER — Ambulatory Visit (INDEPENDENT_AMBULATORY_CARE_PROVIDER_SITE_OTHER): Payer: No Typology Code available for payment source

## 2023-06-18 ENCOUNTER — Ambulatory Visit (INDEPENDENT_AMBULATORY_CARE_PROVIDER_SITE_OTHER): Payer: No Typology Code available for payment source | Admitting: Obstetrics

## 2023-06-18 ENCOUNTER — Encounter: Payer: Self-pay | Admitting: Obstetrics

## 2023-06-18 VITALS — BP 117/76 | HR 74 | Wt 154.0 lb

## 2023-06-18 VITALS — BP 117/76 | HR 74 | Wt 154.4 lb

## 2023-06-18 DIAGNOSIS — O10013 Pre-existing essential hypertension complicating pregnancy, third trimester: Secondary | ICD-10-CM

## 2023-06-18 DIAGNOSIS — Z3A36 36 weeks gestation of pregnancy: Secondary | ICD-10-CM

## 2023-06-18 DIAGNOSIS — O10913 Unspecified pre-existing hypertension complicating pregnancy, third trimester: Secondary | ICD-10-CM

## 2023-06-18 DIAGNOSIS — O0993 Supervision of high risk pregnancy, unspecified, third trimester: Secondary | ICD-10-CM

## 2023-06-18 LAB — POCT URINALYSIS DIPSTICK OB
Bilirubin, UA: NEGATIVE
Blood, UA: NEGATIVE
Glucose, UA: NEGATIVE
Ketones, UA: NEGATIVE
Leukocytes, UA: NEGATIVE
Nitrite, UA: NEGATIVE
Spec Grav, UA: 1.015 (ref 1.010–1.025)
Urobilinogen, UA: 0.2 E.U./dL
pH, UA: 6.5 (ref 5.0–8.0)

## 2023-06-18 NOTE — Assessment & Plan Note (Signed)
-  Discussed GBS results and treatment -Reviewed when to go to the hospital

## 2023-06-18 NOTE — Progress Notes (Signed)
    Return Prenatal Note   Assessment/Plan   Plan  27 y.o. G2P1001 at [redacted]w[redacted]d presents for follow-up OB visit. Reviewed prenatal record including previous visit note.  Maternal chronic hypertension, third trimester -Normotensive today. Taking labetalol and ASA. -Desires IOL in 38th week - schedule at next visit  Pregnancy, supervision, high-risk -Discussed GBS results and treatment -Reviewed when to go to the hospital -Reactive NST today   No orders of the defined types were placed in this encounter.  Return in about 1 week (around 06/25/2023).   Future Appointments  Date Time Provider Department Center  06/25/2023 10:15 AM AOB-NST ROOM [facility] None  06/25/2023 10:55 AM [name], MD [facility] None    For next visit:   NST. Schedule IOL for 38th week     Subjective   27 y.o. G2P1001 at [redacted]w[redacted]d presents for this follow-up prenatal visit.  Yecenia is feeling well. She is ready for labor! Feeling a lot of pressure. Denies HA, visual changes, and epigastric pain. Having some BH but no regular ctx. Denies LOF and vaginal bleeding.   Patient reports: Movement: Present Contractions: Irritability  Objective   Flow sheet Vitals: Pulse Rate: 74 BP: 117/76 Fundal Height: 35 cm Fetal Heart Rate (bpm): 152 Total weight gain: 24 lb (10.9 kg)  General Appearance  No acute distress, well appearing, and well nourished Pulmonary   Normal work of breathing Neurologic   Alert and oriented to person, place, and time Psychiatric   Mood and affect within normal limits  Glenetta Borg, CNM  06/17/2410:14 AM

## 2023-06-18 NOTE — Progress Notes (Signed)
ROB [redacted]w[redacted]d: She is doing well today. She reports good fetal movement. She has been having some Braxton Hick's contractions. She has no new concerns today.

## 2023-06-18 NOTE — Assessment & Plan Note (Addendum)
-  Normotensive today. Taking labetalol and ASA. -Desires IOL in 38th week - schedule at next visit

## 2023-06-19 ENCOUNTER — Encounter: Payer: Self-pay | Admitting: Obstetrics

## 2023-06-23 DIAGNOSIS — Z3A37 37 weeks gestation of pregnancy: Secondary | ICD-10-CM | POA: Insufficient documentation

## 2023-06-23 NOTE — Progress Notes (Signed)
    NURSE VISIT NOTE  Subjective:    Patient ID: Dana Hood, female    DOB: 02/28/1996, 28 y.o.   MRN: [id number]  HPI  Patient is a 27 y.o. G49P1001 female who presents for fetal monitoring per order from Guadlupe Spanish, CNM.   Objective:    BP 128/89   Pulse 93   Ht 5\' 1"  (1.549 m)   Wt 154 lb (69.9 kg)   LMP 10/04/2022 (Exact Date)   BMI 29.10 kg/m  Estimated Date of Delivery: 07/11/23  [redacted]w[redacted]d  Fetus A Non-Stress Test Interpretation for 06/25/23  Indication: Chronic Hypertenstion  Fetal Heart Rate A Mode: External Baseline Rate (A): 130 bpm Variability: Moderate Accelerations: 15 x 15 Decelerations: None Multiple birth?: No  Uterine Activity Mode: Toco Contraction Frequency (min): None  Interpretation (Fetal Testing) Nonstress Test Interpretation: Reactive Overall Impression: Reassuring for gestational age   Assessment:   1. Maternal chronic hypertension, third trimester   2. [redacted] weeks gestation of pregnancy      Plan:   Results reviewed and discussed with patient by  [name], MD.     Rocco Serene, LPN

## 2023-06-23 NOTE — Patient Instructions (Signed)

## 2023-06-25 ENCOUNTER — Ambulatory Visit (INDEPENDENT_AMBULATORY_CARE_PROVIDER_SITE_OTHER): Payer: No Typology Code available for payment source | Admitting: Obstetrics and Gynecology

## 2023-06-25 ENCOUNTER — Ambulatory Visit (INDEPENDENT_AMBULATORY_CARE_PROVIDER_SITE_OTHER): Payer: No Typology Code available for payment source

## 2023-06-25 VITALS — BP 128/89 | HR 93 | Ht 61.0 in | Wt 154.0 lb

## 2023-06-25 VITALS — BP 128/89 | HR 93 | Wt 154.0 lb

## 2023-06-25 DIAGNOSIS — O99343 Other mental disorders complicating pregnancy, third trimester: Secondary | ICD-10-CM | POA: Diagnosis not present

## 2023-06-25 DIAGNOSIS — O0993 Supervision of high risk pregnancy, unspecified, third trimester: Secondary | ICD-10-CM

## 2023-06-25 DIAGNOSIS — Z3A37 37 weeks gestation of pregnancy: Secondary | ICD-10-CM

## 2023-06-25 DIAGNOSIS — O99613 Diseases of the digestive system complicating pregnancy, third trimester: Secondary | ICD-10-CM

## 2023-06-25 DIAGNOSIS — F419 Anxiety disorder, unspecified: Secondary | ICD-10-CM | POA: Diagnosis not present

## 2023-06-25 DIAGNOSIS — Z3490 Encounter for supervision of normal pregnancy, unspecified, unspecified trimester: Secondary | ICD-10-CM

## 2023-06-25 DIAGNOSIS — K219 Gastro-esophageal reflux disease without esophagitis: Secondary | ICD-10-CM

## 2023-06-25 DIAGNOSIS — O10013 Pre-existing essential hypertension complicating pregnancy, third trimester: Secondary | ICD-10-CM

## 2023-06-25 DIAGNOSIS — O10913 Unspecified pre-existing hypertension complicating pregnancy, third trimester: Secondary | ICD-10-CM

## 2023-06-25 DIAGNOSIS — I10 Essential (primary) hypertension: Secondary | ICD-10-CM

## 2023-06-25 LAB — POCT URINALYSIS DIPSTICK OB
Bilirubin, UA: NEGATIVE
Blood, UA: NEGATIVE
Glucose, UA: NEGATIVE
Ketones, UA: NEGATIVE
Leukocytes, UA: NEGATIVE
Nitrite, UA: NEGATIVE
POC,PROTEIN,UA: NEGATIVE
Spec Grav, UA: 1.01 — AB (ref 1.010–1.025)
Urobilinogen, UA: 0.2 E.U./dL
pH, UA: 7 (ref 5.0–8.0)

## 2023-06-25 NOTE — Progress Notes (Signed)
ROB [redacted]w[redacted]d: NST, Patient reports good fetal movement with pressure and irregular ctx, denies vb, lof. Patient reports losing mucus plug two days ago and has been having more frequent but irregular ctx. Requests membrane sweep/schedule IOL.

## 2023-06-25 NOTE — Addendum Note (Signed)
Addended by: Fabian November on: 06/25/2023 11:56 AM   Modules accepted: Orders

## 2023-06-25 NOTE — Progress Notes (Signed)
ROB: Patient is a 27 y.o. G2P1001 at [redacted]w[redacted]d who presents for routine OB care.  Pregnancy is complicated by  Anxiety; GERD (gastroesophageal reflux disease); Chronic hypertension in pregnancy on Labetalol; Rubella non-immune status, antepartum. Patient has complaints of irregular contractions and pelvic pressure. Notes she lost her mucus plug 2 days ago.  Is desiring membrane sweep as she hopes to go into labor prior to her induction (to be scheduled this visit).  Reviewed labor precautions, membrane sweeping performed today. IOL scheduled for 07/02/23 at midnight. NST performed today was reviewed and was found to be reactive.  Continue recommended antenatal testing and prenatal care.   OFFICE NONSTRESS TEST INTERPRETATION  INDICATIONS: Chronic hypertension  FHR baseline: 130 bpm Accelerations: present Decelerations: Absent Variability: moderate in degree  Tocometry: Uterine irritability  RESULTS:Reactive COMMENTS: None   PLAN: 1. Continue fetal kick counts twice a day. 2. Scheduled for IOL 07/02/2023

## 2023-06-25 NOTE — Addendum Note (Signed)
Addended by: Fabian November on: 06/25/2023 12:05 PM   Modules accepted: Orders

## 2023-06-30 ENCOUNTER — Emergency Department: Admission: EM | Admit: 2023-06-30 | Payer: No Typology Code available for payment source | Source: Home / Self Care

## 2023-06-30 ENCOUNTER — Encounter: Payer: Self-pay | Admitting: Obstetrics and Gynecology

## 2023-07-01 ENCOUNTER — Inpatient Hospital Stay
Admission: EM | Admit: 2023-07-01 | Discharge: 2023-07-02 | DRG: 807 | Disposition: A | Discharge status: Home / Self Care | Payer: No Typology Code available for payment source | Attending: Obstetrics and Gynecology | Admitting: Obstetrics and Gynecology

## 2023-07-01 ENCOUNTER — Inpatient Hospital Stay: Payer: No Typology Code available for payment source | Admitting: General Practice

## 2023-07-01 ENCOUNTER — Encounter: Payer: Self-pay | Admitting: Obstetrics and Gynecology

## 2023-07-01 ENCOUNTER — Other Ambulatory Visit: Payer: Self-pay

## 2023-07-01 DIAGNOSIS — F419 Anxiety disorder, unspecified: Secondary | ICD-10-CM | POA: Diagnosis present

## 2023-07-01 DIAGNOSIS — O10913 Unspecified pre-existing hypertension complicating pregnancy, third trimester: Secondary | ICD-10-CM | POA: Diagnosis present

## 2023-07-01 DIAGNOSIS — Z8249 Family history of ischemic heart disease and other diseases of the circulatory system: Secondary | ICD-10-CM | POA: Diagnosis not present

## 2023-07-01 DIAGNOSIS — Z79899 Other long term (current) drug therapy: Secondary | ICD-10-CM | POA: Diagnosis not present

## 2023-07-01 DIAGNOSIS — O0990 Supervision of high risk pregnancy, unspecified, unspecified trimester: Secondary | ICD-10-CM

## 2023-07-01 DIAGNOSIS — Z7982 Long term (current) use of aspirin: Secondary | ICD-10-CM

## 2023-07-01 DIAGNOSIS — O360130 Maternal care for anti-D [Rh] antibodies, third trimester, not applicable or unspecified: Secondary | ICD-10-CM

## 2023-07-01 DIAGNOSIS — O1092 Unspecified pre-existing hypertension complicating childbirth: Secondary | ICD-10-CM | POA: Diagnosis present

## 2023-07-01 DIAGNOSIS — Z3490 Encounter for supervision of normal pregnancy, unspecified, unspecified trimester: Secondary | ICD-10-CM

## 2023-07-01 DIAGNOSIS — Z3A38 38 weeks gestation of pregnancy: Secondary | ICD-10-CM

## 2023-07-01 DIAGNOSIS — O99824 Streptococcus B carrier state complicating childbirth: Secondary | ICD-10-CM | POA: Diagnosis present

## 2023-07-01 DIAGNOSIS — O99344 Other mental disorders complicating childbirth: Secondary | ICD-10-CM | POA: Diagnosis present

## 2023-07-01 DIAGNOSIS — O10013 Pre-existing essential hypertension complicating pregnancy, third trimester: Secondary | ICD-10-CM | POA: Diagnosis not present

## 2023-07-01 DIAGNOSIS — O09899 Supervision of other high risk pregnancies, unspecified trimester: Secondary | ICD-10-CM

## 2023-07-01 DIAGNOSIS — O10919 Unspecified pre-existing hypertension complicating pregnancy, unspecified trimester: Secondary | ICD-10-CM

## 2023-07-01 DIAGNOSIS — Z87891 Personal history of nicotine dependence: Secondary | ICD-10-CM

## 2023-07-01 HISTORY — DX: Unspecified pre-existing hypertension complicating pregnancy, unspecified trimester: O10.919

## 2023-07-01 LAB — CBC
HCT: 32.8 % — ABNORMAL LOW (ref 36.0–46.0)
Hemoglobin: 10.6 g/dL — ABNORMAL LOW (ref 12.0–15.0)
MCH: 27.4 pg (ref 26.0–34.0)
MCHC: 32.3 g/dL (ref 30.0–36.0)
MCV: 84.8 fL (ref 80.0–100.0)
Platelets: 240 10*3/uL (ref 150–400)
RBC: 3.87 MIL/uL (ref 3.87–5.11)
RDW: 17.6 % — ABNORMAL HIGH (ref 11.5–15.5)
WBC: 8.4 10*3/uL (ref 4.0–10.5)
nRBC: 0 % (ref 0.0–0.2)

## 2023-07-01 LAB — RPR: RPR Ser Ql: NONREACTIVE

## 2023-07-01 LAB — TYPE AND SCREEN
ABO/RH(D): O POS
Antibody Screen: NEGATIVE

## 2023-07-01 MED ORDER — IBUPROFEN 600 MG PO TABS
600.0000 mg | ORAL_TABLET | Freq: Four times a day (QID) | ORAL | Status: DC
  Administered 2023-07-01 – 2023-07-02 (×6): 600 mg via ORAL
  Filled 2023-07-01 (×6): qty 1

## 2023-07-01 MED ORDER — SOD CITRATE-CITRIC ACID 500-334 MG/5ML PO SOLN: 30.0000 mL | ORAL | Status: DC | PRN

## 2023-07-01 MED ORDER — LACTATED RINGERS IV BOLUS
1000.0000 mL | Freq: Once | INTRAVENOUS | Status: AC
  Administered 2023-07-01: 1000 mL via INTRAVENOUS

## 2023-07-01 MED ORDER — OXYTOCIN BOLUS FROM INFUSION: 333.0000 mL | Freq: Once | INTRAVENOUS | Status: DC

## 2023-07-01 MED ORDER — ONDANSETRON HCL 4 MG/2ML IJ SOLN: 4.0000 mg | Freq: Four times a day (QID) | INTRAMUSCULAR | Status: DC | PRN

## 2023-07-01 MED ORDER — AMMONIA AROMATIC IN INHA
RESPIRATORY_TRACT | Status: AC
  Filled 2023-07-01: qty 10

## 2023-07-01 MED ORDER — OXYTOCIN-SODIUM CHLORIDE 30-0.9 UT/500ML-% IV SOLN
2.5000 [IU]/h | INTRAVENOUS | Status: DC | PRN
  Administered 2023-07-01: 2.5 [IU]/h via INTRAVENOUS

## 2023-07-01 MED ORDER — PHENYLEPHRINE 80 MCG/ML (10ML) SYRINGE FOR IV PUSH (FOR BLOOD PRESSURE SUPPORT): 80.0000 ug | PREFILLED_SYRINGE | INTRAVENOUS | Status: DC | PRN

## 2023-07-01 MED ORDER — SODIUM CHLORIDE 0.9 % IV SOLN
1.0000 g | INTRAVENOUS | Status: DC
  Administered 2023-07-01: 1 g via INTRAVENOUS
  Filled 2023-07-01: qty 1000

## 2023-07-01 MED ORDER — LABETALOL HCL 100 MG PO TABS
100.0000 mg | ORAL_TABLET | Freq: Two times a day (BID) | ORAL | Status: DC
  Administered 2023-07-01 – 2023-07-02 (×3): 100 mg via ORAL
  Filled 2023-07-01 (×3): qty 1

## 2023-07-01 MED ORDER — ACETAMINOPHEN 325 MG PO TABS
650.0000 mg | ORAL_TABLET | ORAL | Status: DC | PRN
  Administered 2023-07-01: 650 mg via ORAL
  Filled 2023-07-01: qty 2

## 2023-07-01 MED ORDER — LABETALOL HCL 100 MG PO TABS: 100.0000 mg | ORAL_TABLET | Freq: Two times a day (BID) | ORAL | Status: DC

## 2023-07-01 MED ORDER — COCONUT OIL OIL: 1.0000 | TOPICAL_OIL | Status: DC | PRN

## 2023-07-01 MED ORDER — OXYCODONE-ACETAMINOPHEN 5-325 MG PO TABS: 1.0000 | ORAL_TABLET | ORAL | Status: DC | PRN

## 2023-07-01 MED ORDER — SIMETHICONE 80 MG PO CHEW: 80.0000 mg | CHEWABLE_TABLET | ORAL | Status: DC | PRN

## 2023-07-01 MED ORDER — OXYTOCIN-SODIUM CHLORIDE 30-0.9 UT/500ML-% IV SOLN
2.5000 [IU]/h | INTRAVENOUS | Status: DC
  Filled 2023-07-01: qty 500

## 2023-07-01 MED ORDER — OXYTOCIN 10 UNIT/ML IJ SOLN
INTRAMUSCULAR | Status: AC
  Filled 2023-07-01: qty 2

## 2023-07-01 MED ORDER — MEASLES, MUMPS & RUBELLA VAC IJ SOLR
0.5000 mL | Freq: Once | INTRAMUSCULAR | Status: DC
  Filled 2023-07-01: qty 0.5

## 2023-07-01 MED ORDER — SERTRALINE HCL 100 MG PO TABS
100.0000 mg | ORAL_TABLET | Freq: Every day | ORAL | Status: DC
  Administered 2023-07-01 – 2023-07-02 (×2): 100 mg via ORAL
  Filled 2023-07-01 (×2): qty 1

## 2023-07-01 MED ORDER — LACTATED RINGERS IV SOLN
500.0000 mL | INTRAVENOUS | Status: DC | PRN
  Administered 2023-07-01: 500 mL via INTRAVENOUS

## 2023-07-01 MED ORDER — ACETAMINOPHEN 325 MG PO TABS: 650.0000 mg | ORAL_TABLET | ORAL | Status: DC | PRN

## 2023-07-01 MED ORDER — DIPHENHYDRAMINE HCL 50 MG/ML IJ SOLN: 12.5000 mg | INTRAMUSCULAR | Status: DC | PRN

## 2023-07-01 MED ORDER — LACTATED RINGERS IV SOLN: INTRAVENOUS | Status: DC

## 2023-07-01 MED ORDER — OXYTOCIN-SODIUM CHLORIDE 30-0.9 UT/500ML-% IV SOLN
1.0000 m[IU]/min | INTRAVENOUS | Status: DC
  Administered 2023-07-01: 2 m[IU]/min via INTRAVENOUS

## 2023-07-01 MED ORDER — MISOPROSTOL 50MCG HALF TABLET
50.0000 ug | ORAL_TABLET | ORAL | Status: DC | PRN
  Filled 2023-07-01: qty 1

## 2023-07-01 MED ORDER — LIDOCAINE-EPINEPHRINE (PF) 1.5 %-1:200000 IJ SOLN
INTRAMUSCULAR | Status: DC | PRN
  Administered 2023-07-01 (×2): 3 mL via PERINEURAL

## 2023-07-01 MED ORDER — EPHEDRINE 5 MG/ML INJ: 10.0000 mg | INTRAVENOUS | Status: DC | PRN

## 2023-07-01 MED ORDER — LACTATED RINGERS IV SOLN
500.0000 mL | Freq: Once | INTRAVENOUS | Status: AC
  Administered 2023-07-01: 500 mL via INTRAVENOUS

## 2023-07-01 MED ORDER — WITCH HAZEL-GLYCERIN EX PADS
MEDICATED_PAD | CUTANEOUS | Status: DC | PRN
  Filled 2023-07-01: qty 100

## 2023-07-01 MED ORDER — SODIUM CHLORIDE 0.9 % IV SOLN
2.0000 g | Freq: Once | INTRAVENOUS | Status: AC
  Administered 2023-07-01: 2 g via INTRAVENOUS
  Filled 2023-07-01: qty 2000

## 2023-07-01 MED ORDER — SODIUM CHLORIDE 0.9 % IV SOLN
INTRAVENOUS | Status: DC | PRN
  Administered 2023-07-01: 5 mL via EPIDURAL
  Administered 2023-07-01: 4 mL via EPIDURAL

## 2023-07-01 MED ORDER — MISOPROSTOL 200 MCG PO TABS
ORAL_TABLET | ORAL | Status: AC
  Filled 2023-07-01: qty 4

## 2023-07-01 MED ORDER — TERBUTALINE SULFATE 1 MG/ML IJ SOLN: 0.2500 mg | Freq: Once | INTRAMUSCULAR | Status: DC | PRN

## 2023-07-01 MED ORDER — SERTRALINE HCL 100 MG PO TABS
100.0000 mg | ORAL_TABLET | Freq: Every day | ORAL | Status: DC
  Filled 2023-07-01: qty 1

## 2023-07-01 MED ORDER — FENTANYL-BUPIVACAINE-NACL 0.5-0.125-0.9 MG/250ML-% EP SOLN
12.0000 mL/h | EPIDURAL | Status: DC | PRN
  Administered 2023-07-01: 12 mL/h via EPIDURAL
  Filled 2023-07-01: qty 250

## 2023-07-01 MED ORDER — DIPHENHYDRAMINE HCL 25 MG PO CAPS: 25.0000 mg | ORAL_CAPSULE | Freq: Four times a day (QID) | ORAL | Status: DC | PRN

## 2023-07-01 MED ORDER — LIDOCAINE HCL (PF) 1 % IJ SOLN
30.0000 mL | INTRAMUSCULAR | Status: DC | PRN
  Filled 2023-07-01: qty 30

## 2023-07-01 MED ORDER — DOCUSATE SODIUM 100 MG PO CAPS
100.0000 mg | ORAL_CAPSULE | Freq: Two times a day (BID) | ORAL | Status: DC
  Administered 2023-07-01 – 2023-07-02 (×2): 100 mg via ORAL
  Filled 2023-07-01 (×2): qty 1

## 2023-07-01 MED ORDER — PRENATAL MULTIVITAMIN CH
1.0000 | ORAL_TABLET | Freq: Every day | ORAL | Status: DC
  Administered 2023-07-01 – 2023-07-02 (×2): 1 via ORAL
  Filled 2023-07-01 (×2): qty 1

## 2023-07-01 MED ORDER — OXYCODONE-ACETAMINOPHEN 5-325 MG PO TABS: 2.0000 | ORAL_TABLET | ORAL | Status: DC | PRN

## 2023-07-01 MED ORDER — BENZOCAINE-MENTHOL 20-0.5 % EX AERO
1.0000 | INHALATION_SPRAY | CUTANEOUS | Status: DC | PRN
  Administered 2023-07-01: 1 via TOPICAL
  Filled 2023-07-01: qty 56

## 2023-07-01 NOTE — Anesthesia Procedure Notes (Signed)
Epidural Patient location during procedure: OB Start time: 07/01/2023 3:46 AM End time: 07/01/2023 4:05 AM  Staffing Anesthesiologist: Stephanie Coup, MD Performed: anesthesiologist   Preanesthetic Checklist Completed: patient identified, IV checked, site marked, risks and benefits discussed, surgical consent, monitors and equipment checked, pre-op evaluation and timeout performed  Epidural Patient position: sitting Prep: Betadine Patient monitoring: heart rate, continuous pulse ox and blood pressure Approach: midline Location: L4-L5 Injection technique: LOR saline  Needle:  Needle type: Tuohy  Needle gauge: 18 G Needle length: 9 cm and 9 Needle insertion depth: 5 cm Catheter type: closed end flexible Catheter size: 20 Guage Catheter at skin depth: 10 cm Test dose: negative and 1.5% lidocaine with Epi 1:200 K  Assessment Sensory level: T4 Events: blood not aspirated, no cerebrospinal fluid, injection not painful, no injection resistance, no paresthesia and negative IV test  Additional Notes Called back to the room to reevaluate epidural. Pain had not gotten better since first epidural was placed. Lateral aspect of the patients left leg was very numb but was not getting pain relief anywhere else. Recommended replacing epidural. Patient was in agreement.   1 attempt Pt. Evaluated and documentation done after procedure finished. Patient identified. Risks/Benefits/Options discussed with patient including but not limited to bleeding, infection, nerve damage, paralysis, failed block, incomplete pain control, headache, blood pressure changes, nausea, vomiting, reactions to medication both or allergic, itching and postpartum back pain. Confirmed with bedside nurse the patient's most recent platelet count. Confirmed with patient that they are not currently taking any anticoagulation, have any bleeding history or any family history of bleeding disorders. Patient expressed understanding and  wished to proceed. All questions were answered. Sterile technique was used throughout the entire procedure. Please see nursing notes for vital signs. Test dose was given through epidural catheter and negative prior to continuing to dose epidural or start infusion. Warning signs of high block given to the patient including shortness of breath, tingling/numbness in hands, complete motor block, or any concerning symptoms with instructions to call for help. Patient was given instructions on fall risk and not to get out of bed. All questions and concerns addressed with instructions to call with any issues or inadequate analgesia.    Patient tolerated the insertion well without immediate complications. Reason for block:procedure for pain

## 2023-07-01 NOTE — Progress Notes (Signed)
Labor Progress Note   ASSESSMENT/PLAN   Dana Hood 27 y.o.   G2P1001  at [redacted]w[redacted]d here with induction due to chronic hypertension.  FWB:  - Fetal well being assessed:        GBS: - GBS positive, now adequately treated with ampicillin.  LABOR: - Now in active labor, doing well. - Pain Management: epidural, hot spot on left side - Discussed options with patient and offered attempt at reduction of cervical lip. Patient desires to try to see if hot spot with epidural can be relieved by repositioning and then try to start pushing.  - Anticipate SVD    Principal Problem:   Pre-existing hypertension affecting pregnancy Active Problems:   Anxiety   Rubella non-immune status, antepartum   Maternal chronic hypertension, third trimester   Pregnancy, supervision, high-risk     Overview:      Clinical Staff Provider      Office Location  [facility] Dating  LMP/11w ultrasound      Language  English Anatomy US  Incomplete (facial profile)      Flu Vaccine  Declined Genetic Screen  NIPS: MaterniT21 nml, female.       TDaP vaccine  Declined Hgb A1C or      GTT Early :     Third trimester : 109      Covid declined   LAB RESULTS       Rhogam  O/Positive/-- (01/02 1008)  Blood Type O/Positive/-- (01/02 1008)            Feeding Plan breast Antibody Negative (01/02 1008)      Contraception Tubal or IUD Rubella <0.90 (01/02 1008)      Circumcision yes RPR Non Reactive (01/02 1008)       Pediatrician  [facility] HBsAg Negative (01/02 1008)       Support Person Dierdre Searles HIV Non Reactive (01/02 1008)      Prenatal Classes no Varicella      Immune              GBS  (For PCN allergy, check sensitivities)       BTL Consent  Hep C Non Reactive (01/02 1008)       VBAC Consent  Pap Diagnosis      Date Value Ref Range Status      01/01/2023 (A)  Final       - Atypical squamous cells of undetermined significance (ASC-US)              Hgb Electro          CF          SMA                         SUBJECTIVE/OBJECTIVE   SUBJECTIVE:  Feeling pressure and urge to push.    OBJECTIVE: Vital Signs: Patient Vitals for the past 12 hrs:  BP Temp Temp src Pulse Resp SpO2 Height Weight  07/01/23 0515 121/85 98.6 F (37 C) Oral 76 19 96 % -- --  07/01/23 0415 (!) 142/85 -- -- 75 -- 98 % -- --  07/01/23 0405 (!) 133/97 -- -- 92 -- 97 % -- --  07/01/23 0400 (!) 136/97 -- -- 93 -- 98 % -- --  07/01/23 0345 (!) 148/88 -- -- 76 -- 97 % -- --  07/01/23 0330 139/87 -- -- 80 -- 98 % -- --  07/01/23 0300 134/88 -- -- 75 --  98 % -- --  07/01/23 0255 (!) 140/91 -- -- 71 -- 98 % -- --  07/01/23 0250 (!) 134/99 -- -- 79 -- 99 % -- --  07/01/23 0245 (!) 147/96 98.4 F (36.9 C) Oral 75 18 99 % -- --  07/01/23 0240 (!) 155/105 -- -- 81 -- 100 % -- --  07/01/23 0235 (!) 144/100 -- -- 90 -- 98 % -- --  07/01/23 0035 118/75 98.2 F (36.8 C) Oral 88 18 -- 5\' 1"  (1.549 m) 69.9 kg    Last SVE:  Dilation: 7 Effacement (%): 90 Cervical Position: Middle Station: 0, -1 Presentation: Vertex Exam by:: RRL -  , Rupture Date: 07/01/23, Rupture Time: 0113,    FHR:   - Mode: External  - Baseline Rate (A): 120 bpm  -    - Characteristics (ie - accels, decels): Accelerations: 15 x 15  -    UTERINE ACTIVITY:   - Mode: Toco  - Contraction Frequency (min): 2-3 minutes

## 2023-07-01 NOTE — Lactation Note (Signed)
This note was copied from a baby's chart. Lactation Consultation Note  Patient Name: Boy Talecia Sherlin [id number] Date: 07/01/2023 Age:28 hours Reason for consult: Initial assessment;Early term 37-38.6wks  Lactation to the room for initial visit. States baby latched well after delivery, he 1st baby was a 37 weeker and did not latch well. Mother is holding the baby skin to skin.Baby was latched but had a non-nutritive sucking pattern.  Encouraged feeding on demand and with cues. If baby is not cueing encouraged hand expression and skin to skin. Mother states she was hand expressing at home prior to delivery. She has expressed milk in a freezer at home. Care RN set up DEBP at bedside, per patient request. Mother declines wanting to HE right now, spoons left in bassinet. Baby was left skin to skin with Mother. Encouraged 8 or more attempts in the first 24 hours and 8 or more good feeds after 24 HOL. Reviewed appropriate diapers for days of life and How to know your baby is getting enough to eat. Reviewed "Understanding Postpartum and Newborn Care" booklet at bedside. [facility] # left on board, encouraged to call for any assistance. Mother has no further questions at this time.   Maternal Data Has patient been taught Hand Expression?: Yes Does the patient have breastfeeding experience prior to this delivery?: Yes How long did the patient breastfeed?: exclusive pumped until Nuvaring  Feeding Mother's Current Feeding Choice: Breast Milk  Interventions Interventions: Breast feeding basics reviewed;Education;DEBP;Hand pump  Discharge Discharge Education: Engorgement and breast care;Warning signs for feeding baby Pump: DEBP;Personal (has DEBP at bedside, has Medela and lansinoh hands free pump at home)  Consult Status Consult Status: PRN    Jujhar Everett D Lexani Corona 07/01/2023, 11:58 AM

## 2023-07-01 NOTE — Anesthesia Procedure Notes (Signed)
Epidural Patient location during procedure: OB Start time: 07/01/2023 2:18 AM End time: 07/01/2023 2:41 AM  Staffing Anesthesiologist: Stephanie Coup, MD Performed: anesthesiologist   Preanesthetic Checklist Completed: patient identified, IV checked, site marked, risks and benefits discussed, surgical consent, monitors and equipment checked, pre-op evaluation and timeout performed  Epidural Patient position: sitting Prep: Betadine Patient monitoring: heart rate, continuous pulse ox and blood pressure Approach: midline Location: L3-L4 Injection technique: LOR saline  Needle:  Needle type: Tuohy  Needle gauge: 18 G Needle length: 9 cm and 9 Needle insertion depth: 7 cm Catheter type: closed end flexible Catheter size: 20 Guage Catheter at skin depth: 12 cm Test dose: negative and 1.5% lidocaine with Epi 1:200 K  Assessment Sensory level: T4 Events: blood not aspirated, no cerebrospinal fluid, injection not painful, no injection resistance, no paresthesia and negative IV test  Additional Notes 1 attempt Pt. Evaluated and documentation done after procedure finished. Patient identified. Risks/Benefits/Options discussed with patient including but not limited to bleeding, infection, nerve damage, paralysis, failed block, incomplete pain control, headache, blood pressure changes, nausea, vomiting, reactions to medication both or allergic, itching and postpartum back pain. Confirmed with bedside nurse the patient's most recent platelet count. Confirmed with patient that they are not currently taking any anticoagulation, have any bleeding history or any family history of bleeding disorders. Patient expressed understanding and wished to proceed. All questions were answered. Sterile technique was used throughout the entire procedure. Please see nursing notes for vital signs. Test dose was given through epidural catheter and negative prior to continuing to dose epidural or start infusion.  Warning signs of high block given to the patient including shortness of breath, tingling/numbness in hands, complete motor block, or any concerning symptoms with instructions to call for help. Patient was given instructions on fall risk and not to get out of bed. All questions and concerns addressed with instructions to call with any issues or inadequate analgesia.    Patient tolerated the insertion well without immediate complications. Reason for block:procedure for pain

## 2023-07-01 NOTE — Discharge Summary (Signed)
Postpartum Discharge Summary  Date of Service updated***     Patient Name: Dana Hood DOB: 1996/06/24 MRN: [id number]  Date of admission: 07/01/2023 Delivery date:07/01/2023  Delivering provider: Lakoda Hood, Dana Hose  Date of discharge: 07/01/2023  Admitting diagnosis: Pre-existing hypertension affecting pregnancy [O10.919] Intrauterine pregnancy: [redacted]w[redacted]d     Secondary diagnosis:  Principal Problem:   Pre-existing hypertension affecting pregnancy Active Problems:   Anxiety   Rubella non-immune status, antepartum   Maternal chronic hypertension, third trimester   Pregnancy, supervision, high-risk    Discharge diagnosis: Term Pregnancy Delivered                                              Post partum procedures:{Postpartum procedures:23558} Augmentation: AROM and Pitocin Complications: None  Hospital course: Induction of Labor With Vaginal Delivery   27 y.o. yo [id number] at [redacted]w[redacted]d was admitted to the hospital 07/01/2023 for induction of labor.  Indication for induction:  chronic hypertension .  Patient had an uncomplicated labor course. Membrane Rupture Time/Date: 1:13 AM ,07/01/2023   Delivery Method:Vaginal, Spontaneous  Episiotomy: None  Lacerations:  None  Details of delivery can be found in separate delivery note.  Patient had a postpartum course complicated by***. Patient is discharged home 07/01/23.  Newborn Data: Birth date:07/01/2023  Birth time:6:13 AM  Gender:Female  Living status:Living  Apgars: ,  Weight:   Magnesium Sulfate received: {Mag received:30440022} BMZ received: No Rhophylac:N/A MMR:{MMR:30440033} T-DaP: declined Flu: N/A Transfusion:{Transfusion received:30440034}  Physical exam  Vitals:   07/01/23 0415 07/01/23 0515 07/01/23 0625 07/01/23 0645  BP: (!) 142/85 121/85 135/78 118/81  Pulse: 75 76 100 99  Resp:  19    Temp:  98.6 F (37 C)    TempSrc:  Oral    SpO2: 98% 96%    Weight:      Height:       General: {Exam;  general:21111117} Lochia: {Desc; appropriate/inappropriate:30686::"appropriate"} Uterine Fundus: {Desc; firm/soft:30687} Incision: {Exam; incision:21111123} DVT Evaluation: {Exam; dvt:2111122} Labs: Lab Results  Component Value Date   WBC 8.4 07/01/2023   HGB 10.6 (L) 07/01/2023   HCT 32.8 (L) 07/01/2023   MCV 84.8 07/01/2023   PLT 240 07/01/2023      Latest Ref Rng & Units 04/09/2023   11:14 AM  CMP  Glucose 70 - 99 mg/dL 72   BUN 6 - 23 mg/dL 5   Creatinine 1.47 - 8.29 mg/dL 5.62   Sodium 130 - 865 mEq/L 134   Potassium 3.5 - 5.1 mEq/L 4.2   Chloride 96 - 112 mEq/L 102   CO2 19 - 32 mEq/L 26   Calcium 8.4 - 10.5 mg/dL 8.9   Total Protein 6.0 - 8.3 g/dL 7.2   Total Bilirubin 0.2 - 1.2 mg/dL 0.2   Alkaline Phos 39 - 117 U/L 82   AST 0 - 37 U/L 21   ALT 0 - 35 U/L 14    Edinburgh Score:    12/04/2022   10:31 AM  Edinburgh Postnatal Depression Scale Screening Tool  I have been able to laugh and see the funny side of things. 0  I have looked forward with enjoyment to things. 0  I have blamed myself unnecessarily when things went wrong. 0  I have been anxious or worried for no good reason. 0  I have felt scared or panicky for no good reason. 0  Things have been getting on top of me. 2  I have been so unhappy that I have had difficulty sleeping. 0  I have felt sad or miserable. 0  I have been so unhappy that I have been crying. 0  The thought of harming myself has occurred to me. 0  Edinburgh Postnatal Depression Scale Total 2      After visit meds:  Allergies as of 07/01/2023       Reactions   Alpha-gal    No red meat     Med Rec must be completed prior to using this [facility]***        Discharge home in stable condition Infant Feeding: {Baby feeding:23562} Infant Disposition:{CHL IP OB HOME WITH WUJWJX:91478} Discharge instruction: per After Visit Summary and Postpartum booklet. Activity: Advance as tolerated. Pelvic rest for 6 weeks.  Diet: {OB  diet:21111121} Anticipated Birth Control: {Birth Control:23956} Postpartum Appointment:{Outpatient follow up:23559} Additional Postpartum F/U: {PP Procedure:23957} Future Appointments:No future appointments. Follow up Visit:  Follow-up Information     Ranier Coach, Dana Hose, CNM Follow up in 2 week(s).   Specialty: Obstetrics and Gynecology Why: Telehealth visit in 2 weeks Contact information: [address] [place] [address] [phone]         Burney Gauze, CNM Follow up.   Specialty: Obstetrics and Gynecology Why: In person [facility] appointment Contact information: [address] [place] [address] [phone]                     07/01/2023 Dana Hood, CNM

## 2023-07-01 NOTE — Anesthesia Preprocedure Evaluation (Signed)
Anesthesia Evaluation  Patient identified by MRN, date of birth, ID band Patient awake    Reviewed: Allergy & Precautions, NPO status , Patient's Chart, lab work & pertinent test results  Airway Mallampati: III  TM Distance: >3 FB Neck ROM: full    Dental  (+) Chipped   Pulmonary neg pulmonary ROS, former smoker   Pulmonary exam normal        Cardiovascular Exercise Tolerance: Good hypertension, negative cardio ROS Normal cardiovascular exam     Neuro/Psych    GI/Hepatic ,GERD  Medicated,,  Endo/Other    Renal/GU   negative genitourinary   Musculoskeletal   Abdominal   Peds  Hematology negative hematology ROS (+)   Anesthesia Other Findings Past Medical History: No date: Allergy to alpha-gal No date: Anxiety No date: Atrial fibrillation with tachycardic ventricular rate (HCC) No date: Hypertension  Past Surgical History: No date: WISDOM TOOTH EXTRACTION     Comment:  four; age 27  BMI    Body Mass Index: 29.12 kg/m      Reproductive/Obstetrics (+) Pregnancy                             Anesthesia Physical Anesthesia Plan  ASA: 2  Anesthesia Plan: Epidural   Post-op Pain Management:    Induction:   PONV Risk Score and Plan:   Airway Management Planned: Natural Airway  Additional Equipment:   Intra-op Plan:   Post-operative Plan:   Informed Consent: I have reviewed the patients History and Physical, chart, labs and discussed the procedure including the risks, benefits and alternatives for the proposed anesthesia with the patient or authorized representative who has indicated his/her understanding and acceptance.     Dental Advisory Given  Plan Discussed with: Anesthesiologist  Anesthesia Plan Comments: (Patient reports no bleeding problems and no anticoagulant use.   Patient consented for risks of anesthesia including but not limited to:  - adverse  reactions to medications - risk of bleeding, infection and or nerve damage from epidural that could lead to paralysis - risk of headache or failed epidural - nerve damage due to positioning - that if epidural is used for C-section that there is a chance of epidural failure requiring spinal placement or conversion to GA - Damage to heart, brain, lungs, other parts of body or loss of life  Patient voiced understanding.)       Anesthesia Quick Evaluation

## 2023-07-01 NOTE — H&P (Signed)
[facility] Labor & Delivery  History and Physical  ASSESSMENT AND PLAN   Dana Hood is a 27 y.o. G2P1001 at [redacted]w[redacted]d with EDD: 07/11/2023, by Last Menstrual Period admitted for induction of labor due to chronic hypertension.  Induction Plan - Initial SVE 3/80/-2. Plan to start induction with AROM and pitocin titration. - Patient desires epidural for pain management. - Anticipate SVB  Fetal Status: - cephalic presentation by sutures on SVE and Leopold's - EFW: 7 lbs by Leopold's - Continuous monitoring - FHT currently cat I  Chronic Hypertension - Stable on 100 mg labetalol BID, ordered to continue during hospital stay.  Anxiety - Stable on 100 mg sertraline, ordered to continue during hospital stay.  Labs/Immunizations: TDAP: declined Flu: n/a Rubella: Non immune Varicella immune HIV: neg Hep B: neg Hep C: neg RPR: non reactive GBS: positive, treat with ampicillin in labor Lab Results  Component Value Date   VZVIGG 403 12/24/2022   HIV Non Reactive 04/24/2023     Postpartum Plan: - Feeding: Breast Milk - Contraception: plans  BTL or IUD - Prenatal Care Provider: AOB  Attending Dr. Logan Bores was immediately available for the care of the patient.    HPI   Chief Complaint: Induction of laobr  Dana Hood is a 27 y.o. G2P1001 at [redacted]w[redacted]d who presents for induction of labor due to stable chronic hypertension. She reports intermittent contraction and loss of mucous plug. She endorses normal fetal movement and denies LOF, vaginal bleeding.    Pregnancy Complications Patient Active Problem List   Diagnosis Date Noted   Pre-existing hypertension affecting pregnancy 07/01/2023   Pregnancy, supervision, high-risk 12/04/2022   Maternal chronic hypertension, third trimester    Rubella non-immune status, antepartum 05/12/2020   Anxiety 10/26/2019   GERD (gastroesophageal reflux disease) 10/26/2019   Chronic hypertension 10/26/2019    Allergic urticaria history of and currently resolved  09/21/2019   History of migraine 01/30/2017    Review of Systems A twelve point review of systems was negative except as stated in HPI.   HISTORY   Medications Medications Prior to Admission  Medication Sig Dispense Refill Last Dose   aspirin EC 81 MG tablet Take 81 mg by mouth daily. Swallow whole.      cetirizine (ZYRTEC) 10 MG tablet Take 1 tablet (10 mg total) by mouth daily. 90 tablet 1    EPINEPHrine (EPIPEN 2-PAK) 0.3 mg/0.3 mL IJ SOAJ injection Inject 0.3 mg into the muscle as needed for anaphylaxis. Dispense Mylan or Teva brand 2 each 1    ipratropium (ATROVENT) 0.03 % nasal spray Place 2 sprays into both nostrils 3 (three) times daily as needed for rhinitis. 30 mL 0    labetalol (NORMODYNE) 100 MG tablet Take 1 tablet (100 mg total) by mouth 2 (two) times daily. Follow up appt needed 180 tablet 3    ondansetron (ZOFRAN-ODT) 4 MG disintegrating tablet Take 1 tablet (4 mg total) by mouth every 6 (six) hours as needed for nausea. 20 tablet 0    prenatal vitamin w/FE, FA (NATACHEW) 29-1 MG CHEW chewable tablet Chew 2 tablets by mouth daily at 12 noon.      sertraline (ZOLOFT) 100 MG tablet Take 1 tablet (100 mg total) by mouth daily. 90 tablet 3     Allergies is allergic to alpha-gal.   OB History OB History  Gravida Para Term Preterm AB Living  2 1 1  0 0 1  SAB IAB Ectopic Multiple Live Births  0  0 0 0 1    # Outcome Date GA Lbr Len/2nd Weight Sex Delivery Anes PTL Lv  2 Current           1 Term 12/08/20 [redacted]w[redacted]d / 00:41 2650 g M Vag-Spont EPI  LIV     Name: Bolla,BOY Jillayne     Apgar1: 9  Apgar5: 9    Obstetric Comments  Pt has a step child    Past Medical History Past Medical History:  Diagnosis Date   Allergy to alpha-gal    Anxiety    Atrial fibrillation with tachycardic ventricular rate (HCC)    Hypertension     Past Surgical History Past Surgical History:  Procedure Laterality Date   WISDOM TOOTH  EXTRACTION     four; age 73    Social History  reports that she quit smoking about 15 months ago. Her smoking use included cigarettes. She has a 1.25 pack-year smoking history. She has never used smokeless tobacco. She reports that she does not currently use alcohol. She reports that she does not use drugs.   Family History family history includes Healthy in her paternal grandfather and paternal grandmother; Heart failure in her maternal grandfather; Hyperlipidemia in her maternal grandmother, mother, and sister; Hypertension in her father, maternal grandmother, mother, and sister; Migraines in her mother; Rheum arthritis in her maternal uncle.   PHYSICAL EXAM   There were no vitals filed for this visit.  Constitutional: No acute distress, well appearing, and well nourished. Neurologic: She is alert and conversational.  Psychiatric: She has a normal mood and affect.  Musculoskeletal: Normal gait, grossly normal range of motion Cardiovascular: Normal rate.   Pulmonary/Chest: Normal work of breathing.  Gastrointestinal/Abdominal: Soft. Gravid. There is no tenderness.  Skin: Skin is warm and dry. No rash noted.  Genitourinary: Normal external female genitalia.  SVE:   Dilation: 3 Effacement (%): 80 Station: -1, -2 Presentation: Vertex Exam by:: Ziyad Dyar, CNM SSE: deferred  NST Interpretation Indication: Labor induction Baseline: 120 bpm Variability: moderate Accelerations: present Decelerations: absent Contractions: irregular Time noted:  See OBIX Impression: reactive Authenticated by: Lindalou Hose Cardin Nitschke    PRENATAL LABS FROM OB RESULTS CONSOLE     Recent Results (from the past 2160 hour(s))  POC Urinalysis Dipstick OB     Status: Normal   Collection Time: 04/03/23  4:58 PM  Result Value Ref Range   Color, UA     Clarity, UA     Glucose, UA Negative Negative   Bilirubin, UA negative    Ketones, UA negative    Spec Grav, UA 1.020 1.010 - 1.025   Blood, UA negative    pH, UA  6.0 5.0 - 8.0   POC,PROTEIN,UA Negative Negative, Trace, Small (1+), Moderate (2+), Large (3+), 4+   Urobilinogen, UA 0.2 0.2 or 1.0 E.U./dL   Nitrite, UA negative    Leukocytes, UA Negative Negative   Appearance     Odor    Ferritin     Status: Abnormal   Collection Time: 04/09/23 11:14 AM  Result Value Ref Range   Ferritin 5.5 (L) 10.0 - 291.0 ng/mL  IBC panel     Status: Abnormal   Collection Time: 04/09/23 11:14 AM  Result Value Ref Range   Iron 50 42 - 145 ug/dL   Transferrin 161.0 (H) 212.0 - 360.0 mg/dL   Saturation Ratios 8.0 (L) 20.0 - 50.0 %   TIBC 621.6 (H) 250.0 - 450.0 mcg/dL  CBC with Differential/Platelet     Status:  Abnormal   Collection Time: 04/09/23 11:14 AM  Result Value Ref Range   WBC 8.2 4.0 - 10.5 K/uL   RBC 3.77 (L) 3.87 - 5.11 Mil/uL   Hemoglobin 10.9 (L) 12.0 - 15.0 g/dL   HCT 16.1 (L) 09.6 - 04.5 %   MCV 87.0 78.0 - 100.0 fl   MCHC 33.3 30.0 - 36.0 g/dL   RDW 40.9 81.1 - 91.4 %   Platelets 308.0 150.0 - 400.0 K/uL   Neutrophils Relative % 67.2 43.0 - 77.0 %   Lymphocytes Relative 16.6 12.0 - 46.0 %   Monocytes Relative 10.7 3.0 - 12.0 %   Eosinophils Relative 4.6 0.0 - 5.0 %   Basophils Relative 0.9 0.0 - 3.0 %   Neutro Abs 5.5 1.4 - 7.7 K/uL   Lymphs Abs 1.4 0.7 - 4.0 K/uL   Monocytes Absolute 0.9 0.1 - 1.0 K/uL   Eosinophils Absolute 0.4 0.0 - 0.7 K/uL   Basophils Absolute 0.1 0.0 - 0.1 K/uL  Comprehensive metabolic panel     Status: Abnormal   Collection Time: 04/09/23 11:14 AM  Result Value Ref Range   Sodium 134 (L) 135 - 145 mEq/L   Potassium 4.2 3.5 - 5.1 mEq/L   Chloride 102 96 - 112 mEq/L   CO2 26 19 - 32 mEq/L   Glucose, Bld 72 70 - 99 mg/dL   BUN 5 (L) 6 - 23 mg/dL   Creatinine, Ser 7.82 0.40 - 1.20 mg/dL   Total Bilirubin 0.2 0.2 - 1.2 mg/dL   Alkaline Phosphatase 82 39 - 117 U/L   AST 21 0 - 37 U/L   ALT 14 0 - 35 U/L   Total Protein 7.2 6.0 - 8.3 g/dL   Albumin 3.8 3.5 - 5.2 g/dL   GFR 956.21 >30.86 mL/min    Comment:  Calculated using the CKD-EPI Creatinine Equation (2021)   Calcium 8.9 8.4 - 10.5 mg/dL  28 Week RH+Panel     Status: Abnormal   Collection Time: 04/24/23 11:04 AM  Result Value Ref Range   Gestational Diabetes Screen 109 70 - 139 mg/dL    Comment: According to ADA, a glucose threshold of >139 mg/dL after 57-QION load identifies approximately 80% of women with gestational diabetes mellitus, while the sensitivity is further increased to approximately 90% by a threshold of >129 mg/dL.    RPR Ser Ql Non Reactive Non Reactive   HIV Screen 4th Generation wRfx Non Reactive Non Reactive    Comment: HIV-1/HIV-2 antibodies and HIV-1 p24 antigen were NOT detected. There is no laboratory evidence of HIV infection. HIV Negative    WBC 8.7 3.4 - 10.8 x10E3/uL   RBC 3.60 (L) 3.77 - 5.28 x10E6/uL   Hemoglobin 9.9 (L) 11.1 - 15.9 g/dL   Hematocrit 62.9 (L) 52.8 - 46.6 %   MCV 86 79 - 97 fL   MCH 27.5 26.6 - 33.0 pg   MCHC 32.1 31.5 - 35.7 g/dL   RDW 41.3 24.4 - 01.0 %   Platelets 286 150 - 450 x10E3/uL   Neutrophils 73 Not Estab. %   Lymphs 14 Not Estab. %   Monocytes 9 Not Estab. %   Eos 3 Not Estab. %   Basos 1 Not Estab. %   Neutrophils Absolute 6.3 1.4 - 7.0 x10E3/uL   Lymphocytes Absolute 1.2 0.7 - 3.1 x10E3/uL   Monocytes Absolute 0.8 0.1 - 0.9 x10E3/uL   EOS (ABSOLUTE) 0.3 0.0 - 0.4 x10E3/uL   Basophils Absolute 0.0 0.0 -  0.2 x10E3/uL   Immature Granulocytes 0 Not Estab. %   Immature Grans (Abs) 0.0 0.0 - 0.1 x10E3/uL  POC Urinalysis Dipstick OB     Status: Abnormal   Collection Time: 05/14/23 10:31 AM  Result Value Ref Range   Color, UA     Clarity, UA     Glucose, UA Negative Negative   Bilirubin, UA neg    Ketones, UA neg    Spec Grav, UA <=1.005 (A) 1.010 - 1.025   Blood, UA neg    pH, UA 5.0 5.0 - 8.0   POC,PROTEIN,UA Negative Negative, Trace, Small (1+), Moderate (2+), Large (3+), 4+   Urobilinogen, UA 0.2 0.2 or 1.0 E.U./dL   Nitrite, UA neg    Leukocytes, UA Negative  Negative   Appearance     Odor    Cervicovaginal ancillary only     Status: None   Collection Time: 06/09/23  3:03 PM  Result Value Ref Range   Chlamydia Negative    Neisseria Gonorrhea Negative    Comment Normal Reference Ranger Chlamydia - Negative    Comment      Normal Reference Range Neisseria Gonorrhea - Negative  POC Urinalysis Dipstick OB     Status: Normal   Collection Time: 06/09/23  3:59 PM  Result Value Ref Range   Color, UA     Clarity, UA     Glucose, UA Negative Negative   Bilirubin, UA Negative    Ketones, UA Negative    Spec Grav, UA 1.010 1.010 - 1.025   Blood, UA Negative    pH, UA 6.5 5.0 - 8.0   POC,PROTEIN,UA Negative Negative, Trace, Small (1+), Moderate (2+), Large (3+), 4+   Urobilinogen, UA 0.2 0.2 or 1.0 E.U./dL   Nitrite, UA Negative    Leukocytes, UA Negative Negative   Appearance     Odor    Strep Gp B NAA     Status: Abnormal   Collection Time: 06/09/23  4:15 PM   Specimen: Vaginal/Rectal; Genital   VR  Result Value Ref Range   Strep Gp B NAA Positive (A) Negative    Comment: Centers for Disease Control and Prevention (CDC) and American Congress of Obstetricians and Gynecologists (ACOG) guidelines for prevention of perinatal group B streptococcal (GBS) disease specify co-collection of a vaginal and rectal swab specimen to maximize sensitivity of GBS detection. Per the CDC and ACOG, swabbing both the lower vagina and rectum substantially increases the yield of detection compared with sampling the vagina alone. Penicillin G, ampicillin, or cefazolin are indicated for intrapartum prophylaxis of perinatal GBS colonization. Reflex susceptibility testing should be performed prior to use of clindamycin only on GBS isolates from penicillin-allergic women who are considered a high risk for anaphylaxis. Treatment with vancomycin without additional testing is warranted if resistance to clindamycin is noted.   POC Urinalysis Dipstick OB     Status:  Normal   Collection Time: 06/18/23  9:36 AM  Result Value Ref Range   Color, UA     Clarity, UA     Glucose, UA Negative Negative   Bilirubin, UA Negative    Ketones, UA Negative    Spec Grav, UA 1.015 1.010 - 1.025   Blood, UA Negative    pH, UA 6.5 5.0 - 8.0   POC,PROTEIN,UA Trace Negative, Trace, Small (1+), Moderate (2+), Large (3+), 4+   Urobilinogen, UA 0.2 0.2 or 1.0 E.U./dL   Nitrite, UA Negative    Leukocytes, UA Negative Negative  Appearance     Odor    POC Urinalysis Dipstick OB     Status: Abnormal   Collection Time: 06/25/23 10:37 AM  Result Value Ref Range   Color, UA     Clarity, UA     Glucose, UA Negative Negative   Bilirubin, UA Negative    Ketones, UA Negative    Spec Grav, UA 1.010 (A) 1.010 - 1.025   Blood, UA Negative    pH, UA 7.0 5.0 - 8.0   POC,PROTEIN,UA Negative Negative, Trace, Small (1+), Moderate (2+), Large (3+), 4+   Urobilinogen, UA 0.2 0.2 or 1.0 E.U./dL   Nitrite, UA Negative    Leukocytes, UA Negative Negative   Appearance     Odor    CBC     Status: Abnormal   Collection Time: 07/01/23 12:53 AM  Result Value Ref Range   WBC 8.4 4.0 - 10.5 K/uL   RBC 3.87 3.87 - 5.11 MIL/uL   Hemoglobin 10.6 (L) 12.0 - 15.0 g/dL   HCT 16.1 (L) 09.6 - 04.5 %   MCV 84.8 80.0 - 100.0 fL   MCH 27.4 26.0 - 34.0 pg   MCHC 32.3 30.0 - 36.0 g/dL   RDW 40.9 (H) 81.1 - 91.4 %   Platelets 240 150 - 400 K/uL   nRBC 0.0 0.0 - 0.2 %    Comment: Performed at [facility], [address] [place], [place] [address]

## 2023-07-02 MED ORDER — IBUPROFEN 600 MG PO TABS: 600.0000 mg | ORAL_TABLET | Freq: Four times a day (QID) | ORAL | 0 refills | Status: DC

## 2023-07-02 NOTE — Anesthesia Postprocedure Evaluation (Signed)
Anesthesia Post Note  Patient: Dana Hood  Procedure(s) Performed: AN AD HOC LABOR EPIDURAL  Patient location during evaluation: Mother Baby Anesthesia Type: Epidural Level of consciousness: awake and alert Pain management: pain level controlled Vital Signs Assessment: post-procedure vital signs reviewed and stable Respiratory status: spontaneous breathing, nonlabored ventilation and respiratory function stable Cardiovascular status: stable Postop Assessment: no headache, no backache and epidural receding Anesthetic complications: no   No notable events documented.   Last Vitals:  Vitals:   07/01/23 2207 07/02/23 0412  BP: (!) 134/96 135/84  Pulse: 69 63  Resp: 18 18  Temp: 36.6 C (!) 36.4 C  SpO2: 98% 99%    Last Pain:  Vitals:   07/01/23 2300  TempSrc:   PainSc: 0-No pain                 Lynden Oxford

## 2023-07-02 NOTE — Progress Notes (Signed)
Patient discharged home with family.  Discharge instructions, when to follow up, and prescriptions reviewed with patient.  Patient verbalized understanding. Patient will be escorted out by auxiliary.   

## 2023-07-08 ENCOUNTER — Telehealth: Payer: Self-pay

## 2023-07-08 NOTE — Telephone Encounter (Signed)
[facility]- Discharge Call Backs-Left Voicemail about the following below. 1-Do you have any questions or concerns about yourself as you heal?  C-Sec-Is your dressing off?/Vag Del? 2-Any concerns or questions about your baby? Is your baby eating, peeing,pooping well? 3-How was your stay at the hospital? 4- Did our team work together to care for you? You should be receiving a survey in the mail soon.   We would really appreciate it if you could fill that out for [place] and return it in the mail.  We value the feedback to make improvements and continue the great work we do.   If you have any questions please feel free to call me back at [phone]

## 2023-09-03 ENCOUNTER — Telehealth: Payer: Self-pay

## 2023-09-03 MED ORDER — LABETALOL HCL 100 MG PO TABS: 100.0000 mg | ORAL_TABLET | Freq: Two times a day (BID) | ORAL | 1 refills | Status: DC

## 2023-09-03 NOTE — Telephone Encounter (Signed)
Pt states she is needing a rx refill sent in for her labetalol 100mg  tab sent to her pharmacy.

## 2023-09-03 NOTE — Addendum Note (Signed)
Addended by: Pincus Sanes on: 09/03/2023 07:34 PM   Modules accepted: Orders

## 2023-09-11 ENCOUNTER — Ambulatory Visit (INDEPENDENT_AMBULATORY_CARE_PROVIDER_SITE_OTHER): Payer: No Typology Code available for payment source | Admitting: Obstetrics

## 2023-09-11 ENCOUNTER — Encounter: Payer: Self-pay | Admitting: Obstetrics

## 2023-09-11 VITALS — BP 125/92 | HR 82 | Ht 61.0 in | Wt 136.3 lb

## 2023-09-11 DIAGNOSIS — I10 Essential (primary) hypertension: Secondary | ICD-10-CM

## 2023-09-11 MED ORDER — NORETHINDRONE 0.35 MG PO TABS: 1.0000 | ORAL_TABLET | Freq: Every day | ORAL | 3 refills | Status: DC

## 2023-09-11 NOTE — Progress Notes (Signed)
    Post Partum Visit Note  Dana Hood is a 27 y.o. G35P2002 female who presents for a postpartum visit. She is 10 weeks postpartum following a normal spontaneous vaginal delivery.  I have fully reviewed the prenatal and intrapartum course. The delivery was at [redacted]w[redacted]d gestational weeks.  Anesthesia: epidural. Postpartum course has been uncomplicated. Baby is doing well. Baby is feeding by breast. Bleeding  has stopped. She has not yet resumed menses . Bowel function is normal. Bladder function is normal. Patient is sexually active. Contraception method: desires POPs. Postpartum depression screening: negative.   The pregnancy intention screening data noted above was reviewed. Potential methods of contraception were discussed. The patient elected to proceed with No data recorded.    Health Maintenance Due  Topic Date Due   INFLUENZA VACCINE  07/24/2023   COVID-19 Vaccine (1 - 2023-24 season) Never done    The following portions of the patient's history were reviewed and updated as appropriate: allergies, current medications, past medical history, past surgical history, and problem list.  Review of Systems Pertinent items are noted in HPI.  Objective:  Ht 5\' 1"  (1.549 m)   Wt 136 lb 4.8 oz (61.8 kg)   LMP 10/04/2022 (Exact Date)   Breastfeeding Yes   BMI 25.75 kg/m    General:  alert, cooperative, and appears stated age   Breasts:  normal  Lungs: clear to auscultation bilaterally  Heart:  regular rate and rhythm, S1, S2 normal, no murmur, click, rub or gallop  Abdomen: soft, non-tender; bowel sounds normal; no masses,  no organomegaly   Wound: N/A  GU exam:  not indicated       Assessment:   Normal postpartum exam.  CHTN Desires contraception  Plan:   Essential components of care per ACOG recommendations:  1.  Mood and well being: Patient with negative depression screening today. Reviewed local resources for support.  - Patient tobacco use? No.   - hx of drug  use? No.    2. Infant care and feeding:  -Patient currently breastmilk feeding? Yes -Social determinants of health (SDOH) reviewed in EPIC. No concerns  3. Sexuality, contraception and birth spacing - Patient does not want a pregnancy in the next year.  Desired family size is up to 3 children.  - Reviewed reproductive life planning. Reviewed contraceptive methods based on pt preferences and effectiveness.  Patient desired Oral Contraceptive today.   - Discussed birth spacing of 18 months  4. Sleep and fatigue -Encouraged family/partner/community support of 4 hrs of uninterrupted sleep to help with mood and fatigue  5. Physical Recovery  - Discussed patients delivery and complications. She describes her labor as good. - Patient had a  NSVD  with an intact perineum - Patient has urinary incontinence? No. - Patient is safe to resume physical and sexual activity  6.  Health Maintenance - HM due items addressed Yes - Last pap smear  Diagnosis  Date Value Ref Range Status  01/01/2023 (A)  Final   - Atypical squamous cells of undetermined significance (ASC-US)   Pap smear not done at today's visit. Due 12/2025. -Breast Cancer screening indicated? No.   7. Chronic Disease/Pregnancy Condition follow up: Hypertension -Elevated BP today - did not take her meds this AM. BP check scheduled for next week.  - PCP follow up  Glenetta Borg, CNM [facility]

## 2023-09-16 ENCOUNTER — Ambulatory Visit: Payer: No Typology Code available for payment source

## 2023-10-25 ENCOUNTER — Encounter: Payer: Self-pay | Admitting: Obstetrics

## 2023-10-25 ENCOUNTER — Telehealth: Payer: No Typology Code available for payment source

## 2023-10-25 DIAGNOSIS — N644 Mastodynia: Secondary | ICD-10-CM

## 2023-10-25 NOTE — Progress Notes (Signed)
Because of your breast pain, I feel your condition warrants further evaluation and I recommend that you be seen in a face to face visit to rule out mastitis.    NOTE: There will be NO CHARGE for this eVisit   If you are having a true medical emergency please call 911.      For an urgent face to face visit, [place] has eight urgent care centers for your convenience:   NEW!! [facility] Center at [facility] Get Driving Directions [phone] [address] [place], [address]    [facility] Care Center at [facility] Get Driving Directions [phone] [address] [place], [place] [address]   [facility] Health Urgent Care Center [facility]) Get Driving Directions [phone] [address] [place], [place] [address]  [facility] Health Urgent Care Center [facility]) Get Driving Directions [phone] [address] [place],  [place]  [address]  [facility] Health Urgent Care Center [facility] - at [employer]  [phone] [address] [place],  [place] [address]   [facility] Health Urgent Care at [facility] Get Driving Directions [phone] [address] [place], [place] [address]   [facility] Health Urgent Care at [facility] Get Driving Directions  [phone] [address] [place], [place] [address]   [facility] Health Urgent Care at [facility] Directions [phone] [address], [address] [place], [place] [address]  Your MyChart E-visit questionnaire answers were reviewed by a board certified advanced clinical practitioner to complete your personal care plan based on your specific symptoms.  Thank you for using e-Visits.

## 2023-10-29 ENCOUNTER — Telehealth: Payer: No Typology Code available for payment source | Admitting: Family Medicine

## 2023-10-29 DIAGNOSIS — R3989 Other symptoms and signs involving the genitourinary system: Secondary | ICD-10-CM

## 2023-10-29 MED ORDER — CEPHALEXIN 500 MG PO CAPS
500.0000 mg | ORAL_CAPSULE | Freq: Two times a day (BID) | ORAL | 0 refills | Status: AC
Start: 2023-10-29 — End: 2023-11-05

## 2023-10-29 NOTE — Progress Notes (Signed)

## 2024-04-08 ENCOUNTER — Other Ambulatory Visit: Payer: Self-pay | Admitting: Internal Medicine

## 2024-04-08 NOTE — Telephone Encounter (Signed)
 Copied from CRM [id number]. Topic: Clinical - Medication Refill >> Apr 08, 2024 10:49 AM Albertha Alosa wrote: Most Recent Primary Care Visit:  Provider: BURNS, Beckey Bourgeois  Department: [facility]  Visit Type: OFFICE VISIT  Date: 04/09/2023  Medication: sertraline (ZOLOFT) 100 MG tablet  Has the patient contacted their pharmacy? Yes (Agent: If no, request that the patient contact the pharmacy for the refill. If patient does not wish to contact the pharmacy document the reason why and proceed with request.) (Agent: If yes, when and what did the pharmacy advise?)  Is this the correct pharmacy for this prescription? Yes If no, delete pharmacy and type the correct one.  This is the patient's preferred pharmacy:  [id number] - [address] [place] [address] Phone: [phone] Fax: [phone]    Has the prescription been filled recently? No  Is the patient out of the medication? Yes  Has the patient been seen for an appointment in the last year OR does the patient have an upcoming appointment? Yes  Can we respond through MyChart? Yes  Agent: Please be advised that Rx refills may take up to 3 business days. We ask that you follow-up with your pharmacy.

## 2024-04-12 MED ORDER — SERTRALINE HCL 100 MG PO TABS: 100.0000 mg | ORAL_TABLET | Freq: Every day | ORAL | 3 refills | Status: AC

## 2024-04-25 ENCOUNTER — Encounter: Payer: Self-pay | Admitting: Internal Medicine

## 2024-04-25 NOTE — Progress Notes (Unsigned)
 Subjective:    Patient ID: Dana Hood, female    DOB: 01-11-1996, 28 y.o.   MRN: [id number]      HPI Vincentina is here for a Physical exam and her chronic medical problems.    Doing well - no concerns.   Medications and allergies reviewed with patient and updated if appropriate.  Current Outpatient Medications on File Prior to Visit  Medication Sig Dispense Refill   EPINEPHrine  (EPIPEN  2-PAK) 0.3 mg/0.3 mL IJ SOAJ injection Inject 0.3 mg into the muscle as needed for anaphylaxis. Dispense Mylan or Teva brand 2 each 1   labetalol  (NORMODYNE ) 100 MG tablet Take 1 tablet (100 mg total) by mouth 2 (two) times daily. 180 tablet 1   norethindrone  (MICRONOR ) 0.35 MG tablet Take 1 tablet (0.35 mg total) by mouth daily. 84 tablet 3   sertraline  (ZOLOFT ) 100 MG tablet Take 1 tablet (100 mg total) by mouth daily. 90 tablet 3   No current facility-administered medications on file prior to visit.    Review of Systems  Constitutional:  Negative for fever.  Eyes:  Negative for visual disturbance.  Respiratory:  Negative for cough, shortness of breath and wheezing.   Cardiovascular:  Negative for chest pain, palpitations and leg swelling.  Gastrointestinal:  Negative for abdominal pain, blood in stool, constipation and diarrhea.       Gerd controlled  Genitourinary:  Negative for dysuria.  Musculoskeletal:  Negative for arthralgias and back pain.  Skin:  Negative for rash.  Neurological:  Negative for light-headedness and headaches.  Psychiatric/Behavioral:  Negative for dysphoric mood. The patient is not nervous/anxious (controlled).        Objective:   Vitals:   04/26/24 1328  BP: 122/72  Pulse: 74  Temp: 98.1 F (36.7 C)  SpO2: 98%   Filed Weights   04/26/24 1328  Weight: 124 lb (56.2 kg)   Body mass index is 23.43 kg/m.  BP Readings from Last 3 Encounters:  04/26/24 122/72  09/11/23 (!) 125/92  07/02/23 121/82    Wt Readings from Last 3 Encounters:   04/26/24 124 lb (56.2 kg)  09/11/23 136 lb 4.8 oz (61.8 kg)  07/01/23 154 lb 1.6 oz (69.9 kg)       Physical Exam Constitutional: She appears well-developed and well-nourished. No distress.  HENT:  Head: Normocephalic and atraumatic.  Right Ear: External ear normal. Normal ear canal and TM Left Ear: External ear normal.  Normal ear canal and TM Mouth/Throat: Oropharynx is clear and moist.  Eyes: Conjunctivae normal.  Neck: Neck supple. No tracheal deviation present. No thyromegaly present.  No carotid bruit  Cardiovascular: Normal rate, regular rhythm and normal heart sounds.   No murmur heard.  No edema. Pulmonary/Chest: Effort normal and breath sounds normal. No respiratory distress. She has no wheezes. She has no rales.  Breast: deferred   Abdominal: Soft. She exhibits no distension. There is no tenderness.  Lymphadenopathy: She has no cervical adenopathy.  Skin: Skin is warm and dry. She is not diaphoretic.  Psychiatric: She has a normal mood and affect. Her behavior is normal.     Lab Results  Component Value Date   WBC 8.4 07/01/2023   HGB 10.6 (L) 07/01/2023   HCT 32.8 (L) 07/01/2023   PLT 240 07/01/2023   GLUCOSE 72 04/09/2023   CHOL 200 (H) 04/17/2020   TRIG 97 04/17/2020   HDL 62 04/17/2020   LDLCALC 121 (H) 04/17/2020   ALT 14 04/09/2023   AST  21 04/09/2023   NA 134 (L) 04/09/2023   K 4.2 04/09/2023   CL 102 04/09/2023   CREATININE 0.53 04/09/2023   BUN 5 (L) 04/09/2023   CO2 26 04/09/2023   TSH 2.150 04/17/2020         Assessment & Plan:   Physical exam: Screening blood work  ordered Exercise  very active Weight  normal Substance abuse  none   Reviewed recommended immunizations.   Health Maintenance  Topic Date Due   COVID-19 Vaccine (1 - 2024-25 season) 05/11/2024 (Originally 08/24/2023)   INFLUENZA VACCINE  07/23/2024   Cervical Cancer Screening (Pap smear)  01/01/2026   DTaP/Tdap/Td (3 - Td or Tdap) 10/09/2030   Hepatitis C  Screening  Completed   HIV Screening  Completed   HPV VACCINES  Aged Out   Meningococcal B Vaccine  Aged Out          See Problem List for Assessment and Plan of chronic medical problems.

## 2024-04-25 NOTE — Patient Instructions (Addendum)
 Blood work was ordered.       Medications changes include :   None    A referral was ordered and someone will call you to schedule an appointment.     Return in about 1 year (around 04/26/2025) for Physical Exam.     Health Maintenance, Female Adopting a healthy lifestyle and getting preventive care are important in promoting health and wellness. Ask your health care provider about: The right schedule for you to have regular tests and exams. Things you can do on your own to prevent diseases and keep yourself healthy. What should I know about diet, weight, and exercise? Eat a healthy diet  Eat a diet that includes plenty of vegetables, fruits, low-fat dairy products, and lean protein. Do not eat a lot of foods that are high in solid fats, added sugars, or sodium. Maintain a healthy weight Body mass index (BMI) is used to identify weight problems. It estimates body fat based on height and weight. Your health care provider can help determine your BMI and help you achieve or maintain a healthy weight. Get regular exercise Get regular exercise. This is one of the most important things you can do for your health. Most adults should: Exercise for at least 150 minutes each week. The exercise should increase your heart rate and make you sweat (moderate-intensity exercise). Do strengthening exercises at least twice a week. This is in addition to the moderate-intensity exercise. Spend less time sitting. Even light physical activity can be beneficial. Watch cholesterol and blood lipids Have your blood tested for lipids and cholesterol at 29 years of age, then have this test every 5 years. Have your cholesterol levels checked more often if: Your lipid or cholesterol levels are high. You are older than 29 years of age. You are at high risk for heart disease. What should I know about cancer screening? Depending on your health history and family history, you may need to have cancer  screening at various ages. This may include screening for: Breast cancer. Cervical cancer. Colorectal cancer. Skin cancer. Lung cancer. What should I know about heart disease, diabetes, and high blood pressure? Blood pressure and heart disease High blood pressure causes heart disease and increases the risk of stroke. This is more likely to develop in people who have high blood pressure readings or are overweight. Have your blood pressure checked: Every 3-5 years if you are 44-28 years of age. Every year if you are 57 years old or older. Diabetes Have regular diabetes screenings. This checks your fasting blood sugar level. Have the screening done: Once every three years after age 80 if you are at a normal weight and have a low risk for diabetes. More often and at a younger age if you are overweight or have a high risk for diabetes. What should I know about preventing infection? Hepatitis B If you have a higher risk for hepatitis B, you should be screened for this virus. Talk with your health care provider to find out if you are at risk for hepatitis B infection. Hepatitis C Testing is recommended for: Everyone born from [birth year] through 1966. Anyone with known risk factors for hepatitis C. Sexually transmitted infections (STIs) Get screened for STIs, including gonorrhea and chlamydia, if: You are sexually active and are younger than 28 years of age. You are older than 29 years of age and your health care provider tells you that you are at risk for this type of infection. Your sexual  activity has changed since you were last screened, and you are at increased risk for chlamydia or gonorrhea. Ask your health care provider if you are at risk. Ask your health care provider about whether you are at high risk for HIV. Your health care provider may recommend a prescription medicine to help prevent HIV infection. If you choose to take medicine to prevent HIV, you should first get tested for HIV. You  should then be tested every 3 months for as long as you are taking the medicine. Pregnancy If you are about to stop having your period (premenopausal) and you may become pregnant, seek counseling before you get pregnant. Take 400 to 800 micrograms (mcg) of folic acid every day if you become pregnant. Ask for birth control (contraception) if you want to prevent pregnancy. Osteoporosis and menopause Osteoporosis is a disease in which the bones lose minerals and strength with aging. This can result in bone fractures. If you are 30 years old or older, or if you are at risk for osteoporosis and fractures, ask your health care provider if you should: Be screened for bone loss. Take a calcium or vitamin D  supplement to lower your risk of fractures. Be given hormone replacement therapy (HRT) to treat symptoms of menopause. Follow these instructions at home: Alcohol use Do not drink alcohol if: Your health care provider tells you not to drink. You are pregnant, may be pregnant, or are planning to become pregnant. If you drink alcohol: Limit how much you have to: 0-1 drink a day. Know how much alcohol is in your drink. In the [place] one drink equals one 12 oz bottle of beer (355 mL), one 5 oz glass of wine (148 mL), or one 1 oz glass of hard liquor (44 mL). Lifestyle Do not use any products that contain nicotine or tobacco. These products include cigarettes, chewing tobacco, and vaping devices, such as e-cigarettes. If you need help quitting, ask your health care provider. Do not use street drugs. Do not share needles. Ask your health care provider for help if you need support or information about quitting drugs. General instructions Schedule regular health, dental, and eye exams. Stay current with your vaccines. Tell your health care provider if: You often feel depressed. You have ever been abused or do not feel safe at home. Summary Adopting a healthy lifestyle and getting preventive care are  important in promoting health and wellness. Follow your health care provider's instructions about healthy diet, exercising, and getting tested or screened for diseases. Follow your health care provider's instructions on monitoring your cholesterol and blood pressure. This information is not intended to replace advice given to you by your health care provider. Make sure you discuss any questions you have with your health care provider. Document Revised: 04/30/2021 Document Reviewed: 04/30/2021 Elsevier Patient Education  2024 [employer].

## 2024-04-26 ENCOUNTER — Ambulatory Visit (INDEPENDENT_AMBULATORY_CARE_PROVIDER_SITE_OTHER): Admitting: Internal Medicine

## 2024-04-26 VITALS — BP 122/72 | HR 74 | Temp 98.1°F | Ht 61.0 in | Wt 124.0 lb

## 2024-04-26 DIAGNOSIS — I10 Essential (primary) hypertension: Secondary | ICD-10-CM

## 2024-04-26 DIAGNOSIS — F419 Anxiety disorder, unspecified: Secondary | ICD-10-CM

## 2024-04-26 DIAGNOSIS — Z0000 Encounter for general adult medical examination without abnormal findings: Secondary | ICD-10-CM

## 2024-04-26 DIAGNOSIS — K219 Gastro-esophageal reflux disease without esophagitis: Secondary | ICD-10-CM

## 2024-04-26 DIAGNOSIS — R7989 Other specified abnormal findings of blood chemistry: Secondary | ICD-10-CM

## 2024-04-26 LAB — CBC WITH DIFFERENTIAL/PLATELET
Basophils Absolute: 0.1 10*3/uL (ref 0.0–0.1)
Basophils Relative: 1.1 % (ref 0.0–3.0)
Eosinophils Absolute: 0.5 10*3/uL (ref 0.0–0.7)
Eosinophils Relative: 5.4 % — ABNORMAL HIGH (ref 0.0–5.0)
HCT: 40.1 % (ref 36.0–46.0)
Hemoglobin: 13.3 g/dL (ref 12.0–15.0)
Lymphocytes Relative: 23.2 % (ref 12.0–46.0)
Lymphs Abs: 2 10*3/uL (ref 0.7–4.0)
MCHC: 33.2 g/dL (ref 30.0–36.0)
MCV: 91.8 fl (ref 78.0–100.0)
Monocytes Absolute: 1 10*3/uL (ref 0.1–1.0)
Monocytes Relative: 11.4 % (ref 3.0–12.0)
Neutro Abs: 5.1 10*3/uL (ref 1.4–7.7)
Neutrophils Relative %: 58.9 % (ref 43.0–77.0)
Platelets: 356 10*3/uL (ref 150.0–400.0)
RBC: 4.37 Mil/uL (ref 3.87–5.11)
RDW: 13.6 % (ref 11.5–15.5)
WBC: 8.7 10*3/uL (ref 4.0–10.5)

## 2024-04-26 LAB — COMPREHENSIVE METABOLIC PANEL WITH GFR
ALT: 14 U/L (ref 0–35)
AST: 18 U/L (ref 0–37)
Albumin: 5 g/dL (ref 3.5–5.2)
Alkaline Phosphatase: 110 U/L (ref 39–117)
BUN: 14 mg/dL (ref 6–23)
CO2: 30 meq/L (ref 19–32)
Calcium: 9.7 mg/dL (ref 8.4–10.5)
Chloride: 100 meq/L (ref 96–112)
Creatinine, Ser: 0.75 mg/dL (ref 0.40–1.20)
GFR: 108.09 mL/min (ref >60.00)
Glucose, Bld: 83 mg/dL (ref 70–99)
Potassium: 3.8 meq/L (ref 3.5–5.1)
Sodium: 139 meq/L (ref 135–145)
Total Bilirubin: 0.5 mg/dL (ref 0.2–1.2)
Total Protein: 8.1 g/dL (ref 6.0–8.3)

## 2024-04-26 LAB — LIPID PANEL
Cholesterol: 214 mg/dL — ABNORMAL HIGH (ref 0–200)
HDL: 79.4 mg/dL (ref >39.00)
LDL Cholesterol: 122 mg/dL — ABNORMAL HIGH (ref 0–99)
NonHDL: 134.18
Total CHOL/HDL Ratio: 3
Triglycerides: 63 mg/dL (ref 0.0–149.0)
VLDL: 12.6 mg/dL (ref 0.0–40.0)

## 2024-04-26 LAB — TSH: TSH: 6.1 u[IU]/mL — ABNORMAL HIGH (ref 0.35–5.50)

## 2024-04-26 NOTE — Assessment & Plan Note (Signed)
Chronic Blood pressure well-controlled Continue labetalol 100 mg bid Continue to monitor BP at home

## 2024-04-26 NOTE — Assessment & Plan Note (Signed)
Chronic Controlled, stable Continue  sertraline 100 mg daily 

## 2024-04-26 NOTE — Assessment & Plan Note (Signed)
Chronic GERD controlled Continue pepcid 20 mg twice a day

## 2024-04-28 ENCOUNTER — Encounter: Payer: Self-pay | Admitting: Internal Medicine

## 2024-04-28 NOTE — Addendum Note (Signed)
 Addended by: Colene Dauphin on: 04/28/2024 06:01 AM   Modules accepted: Orders

## 2024-05-13 ENCOUNTER — Ambulatory Visit: Payer: Self-pay | Admitting: Allergy & Immunology

## 2024-05-17 ENCOUNTER — Other Ambulatory Visit: Payer: Self-pay | Admitting: Internal Medicine

## 2024-05-20 ENCOUNTER — Ambulatory Visit: Payer: Self-pay | Admitting: Family Medicine

## 2024-08-09 ENCOUNTER — Encounter: Payer: Self-pay | Admitting: Certified Nurse Midwife

## 2024-08-09 ENCOUNTER — Other Ambulatory Visit: Payer: Self-pay | Admitting: Obstetrics

## 2024-08-09 MED ORDER — NORETHINDRONE 0.35 MG PO TABS: 1.0000 | ORAL_TABLET | Freq: Every day | ORAL | 0 refills | Status: DC

## 2024-08-10 ENCOUNTER — Other Ambulatory Visit

## 2024-08-10 ENCOUNTER — Ambulatory Visit: Payer: Self-pay | Admitting: Internal Medicine

## 2024-08-10 DIAGNOSIS — E063 Autoimmune thyroiditis: Secondary | ICD-10-CM

## 2024-08-10 DIAGNOSIS — R7989 Other specified abnormal findings of blood chemistry: Secondary | ICD-10-CM

## 2024-08-10 LAB — TSH: TSH: 2.05 u[IU]/mL (ref 0.35–5.50)

## 2024-08-10 LAB — T4, FREE: Free T4: 0.72 ng/dL (ref 0.60–1.60)

## 2024-08-10 LAB — T3, FREE: T3, Free: 3.5 pg/mL (ref 2.3–4.2)

## 2024-08-11 ENCOUNTER — Ambulatory Visit (INDEPENDENT_AMBULATORY_CARE_PROVIDER_SITE_OTHER): Payer: Self-pay | Admitting: Allergy

## 2024-08-11 ENCOUNTER — Encounter: Payer: Self-pay | Admitting: Allergy

## 2024-08-11 VITALS — BP 128/64 | HR 78 | Resp 16 | Ht 61.0 in | Wt 128.0 lb

## 2024-08-11 DIAGNOSIS — J302 Other seasonal allergic rhinitis: Secondary | ICD-10-CM

## 2024-08-11 DIAGNOSIS — H1013 Acute atopic conjunctivitis, bilateral: Secondary | ICD-10-CM | POA: Diagnosis not present

## 2024-08-11 DIAGNOSIS — T781XXD Other adverse food reactions, not elsewhere classified, subsequent encounter: Secondary | ICD-10-CM

## 2024-08-11 DIAGNOSIS — J3089 Other allergic rhinitis: Secondary | ICD-10-CM | POA: Diagnosis not present

## 2024-08-11 DIAGNOSIS — L308 Other specified dermatitis: Secondary | ICD-10-CM

## 2024-08-11 MED ORDER — EPINEPHRINE 0.3 MG/0.3ML IJ SOAJ: 0.3000 mg | INTRAMUSCULAR | 1 refills | Status: AC | PRN

## 2024-08-11 MED ORDER — RYALTRIS 665-25 MCG/ACT NA SUSP: 2.0000 | Freq: Two times a day (BID) | NASAL | 5 refills | Status: AC | PRN

## 2024-08-11 NOTE — Progress Notes (Signed)
 Follow-up Note  RE: Dana Hood MRN: [id number] DOB: Jan 09, 1996 Date of Office Visit: 08/11/2024   History of present illness: Dana Hood is a 28 y.o. female presenting today for follow-up of allergic rhinitis with conjunctivitis, alpha gal allergy, eczema of eyelid.  She was last seen in the office on 03/28/22 by myself.  Discussed the use of AI scribe software for clinical note transcription with the patient, who gave verbal consent to proceed.  This year, she has experienced a significant change in her allergy symptoms this spring and persisting through the summer. This has resulted in severe and persistent symptoms, including a sensation of a constant sinus infection from spring until mid-June. Despite switching from Zyrtec  to Allegra and using Nasacort nasal spray, she describes this year as the most miserable she has ever been, to the point where she could not go outside due to the severity of her symptoms.  [place]. Her children like to play outside and ask her to come out with them and she has had to decline due to symptoms. She reports increased sensitivity to dog dander, confirmed by a moderate allergy to dog dander discovered after her alpha-gal diagnosis. She has four dogs at home and experienced symptoms after brushing one of them, characterized by uncontrollable sneezing and itchy eyes. She uses Pataday eye drops for eye symptoms, which she finds effective.  Her past medical history includes a diagnosis of alpha-gal syndrome, discovered in 2020. She has experienced worsening reactions to tick bites, with increased local reactions at the bite sites. She avoids red meat due to fear of reactions.  She has access to an epinephrine  device which she has not needed to use.  She is currently using Allegra daily, and Nasacort as needed for her allergy symptoms. She has previously used Zyrtec  and Xyzal. She also takes Pepcid  twice a day.   In terms of  skin issues, she has experienced severe eye with irritation, particularly in her right eye. She does not currently have any ointments for this condition.  She was given a nonsteroid sample at her last visit to use on the eyelid dermatitis that she no longer has any more of this.     Review of systems: 10pt ROS negative unless noted above in HPI  Past medical/social/surgical/family history have been reviewed and are unchanged unless specifically indicated below.  No changes  Medication List: Current Outpatient Medications  Medication Sig Dispense Refill   EPINEPHrine  (EPIPEN  2-PAK) 0.3 mg/0.3 mL IJ SOAJ injection Inject 0.3 mg into the muscle as needed for anaphylaxis. Dispense Mylan or Teva brand 2 each 1   fexofenadine (ALLEGRA ALLERGY) 180 MG tablet      labetalol  (NORMODYNE ) 100 MG tablet TAKE 1 TABLET BY MOUTH TWICE A DAY 180 tablet 1   norethindrone  (MICRONOR ) 0.35 MG tablet Take 1 tablet (0.35 mg total) by mouth daily. 48 tablet 0   sertraline  (ZOLOFT ) 100 MG tablet Take 1 tablet (100 mg total) by mouth daily. 90 tablet 3   No current facility-administered medications for this visit.     Known medication allergies: Allergies  Allergen Reactions   Alpha-Gal     No red meat     Physical examination: Blood pressure 128/64, pulse 78, resp. rate 16, height 5' 1 (1.549 m), weight 128 lb (58.1 kg), SpO2 98%, currently breastfeeding.  General: Alert, interactive, in no acute distress. HEENT: PERRLA, TMs pearly gray, turbinates non-edematous without discharge, post-pharynx non erythematous. Neck: Supple  without lymphadenopathy. Lungs: Clear to auscultation without wheezing, rhonchi or rales. {no increased work of breathing. CV: Normal S1, S2 without murmurs. Abdomen: Nondistended, nontender. Skin: Warm and dry, without lesions or rashes. Extremities:  No clubbing, cyanosis or edema. Neuro:   Grossly intact.  Diagnostics/Labs: None today  Assessment and  plan: Rhinoconjunctivitis, allergic - will obtain updated allergy testing today to see if you have developed additional allergens besides cat, dog, dust mite and pollens from 2023 testing.  This will help determine how many shots you may need if moving through with allergy shots.  - allergy shots re-train and reset the immune system to ignore environmental allergens and decrease the resulting immune response to those allergens (sneezing, itchy watery eyes, runny nose, nasal congestion, etc).   Allergy shots improve symptoms in 80-85% of patients.  Discussed option of RUSH start vs traditional start.  Informational packet provided with insurance codes to check coverage.  - continue Allegra at this time.  May take additional dose of antihistamine if needed.  Rotate antihistamine every 6 months to help maintain efficacy.  - use Ryaltris  nasal spray 2 sprays each nostril twice a day for nasal congestion (mometasone) and nasal drainage (olopatadine) control.  This is a maintenance spray.  - for itchy/watery eyes can use Pataday 1 drop each eye daily as needed  Eczematous dermatitis of eyelid and face - keep skin moisturized especially after bathing - can use Vtama sample once a day as needed for itchy, red, irritated, dry, scaly/flaky areas of the face or eyelid or anywhere on body.  This is a non-steroid option.    Food allergy/Hives - continue to avoid red meat in the diet - will get updated alpha gal panel today to see if you are losing this sensitivity - have access to self-injectable epinephrine  (Epipen ) 0.3mg  at all times - follow emergency action plan in case of allergic reaction  Follow-up in 6-12 months or sooner if needed  I appreciate the opportunity to take part in Corrie's care. Please do not hesitate to contact me with questions.  Sincerely,   Danita Brain, MD Allergy/Immunology [facility]

## 2024-08-11 NOTE — Patient Instructions (Addendum)
 Allergies - will obtain updated allergy testing today to see if you have developed additional allergens besides cat, dog, dust mite and pollens from 2023 testing.  This will help determine how many shots you may need if moving through with allergy shots.  - allergy shots re-train and reset the immune system to ignore environmental allergens and decrease the resulting immune response to those allergens (sneezing, itchy watery eyes, runny nose, nasal congestion, etc).   Allergy shots improve symptoms in 80-85% of patients.  Discussed option of RUSH start vs traditional start.  Informational packet provided with insurance codes to check coverage.  - continue Allegra at this time.  May take additional dose of antihistamine if needed.  Rotate antihistamine every 6 months to help maintain efficacy.  - use Ryaltris  nasal spray 2 sprays each nostril twice a day for nasal congestion (mometasone) and nasal drainage (olopatadine) control.  This is a maintenance spray.  - for itchy/watery eyes can use Pataday 1 drop each eye daily as needed  Eczematous dermatitis of eyelid and face - keep skin moisturized especially after bathing - can use Vtama sample once a day as needed for itchy, red, irritated, dry, scaly/flaky areas of the face or eyelid or anywhere on body.  This is a non-steroid option.    Food allergy/Hives - continue to avoid red meat in the diet - will get updated alpha gal panel today to see if you are losing this sensitivity - have access to self-injectable epinephrine  (Epipen ) 0.3mg  at all times - follow emergency action plan in case of allergic reaction  Follow-up in 6-12 months or sooner if needed

## 2024-08-12 DIAGNOSIS — E063 Autoimmune thyroiditis: Secondary | ICD-10-CM

## 2024-08-12 HISTORY — DX: Autoimmune thyroiditis: E06.3

## 2024-08-12 LAB — THYROID ANTIBODIES (THYROPEROXIDASE & THYROGLOBULIN)
Thyroglobulin Ab: 9 [IU]/mL — ABNORMAL HIGH (ref <=1)
Thyroperoxidase Ab SerPl-aCnc: 17 [IU]/mL — ABNORMAL HIGH (ref <9)

## 2024-08-13 NOTE — Telephone Encounter (Signed)
**Note De-identified  [place] Obfuscation** Please advise 

## 2024-08-15 LAB — ALLERGENS W/TOTAL IGE AREA 2
Alternaria Alternata IgE: <0.1 kU/L
Aspergillus Fumigatus IgE: <0.1 kU/L
Bermuda Grass IgE: 3.36 kU/L — AB
Cat Dander IgE: 1.02 kU/L — AB
Cedar, Mountain IgE: <0.1 kU/L
Cladosporium Herbarum IgE: <0.1 kU/L
Cockroach, German IgE: <0.1 kU/L
Common Silver Birch IgE: <0.1 kU/L
Cottonwood IgE: <0.1 kU/L
D Farinae IgE: <0.1 kU/L
D Pteronyssinus IgE: <0.1 kU/L
Dog Dander IgE: 1.64 kU/L — AB
Elm, American IgE: <0.1 kU/L
Johnson Grass IgE: 4.19 kU/L — AB
Maple/Box Elder IgE: <0.1 kU/L
Mouse Urine IgE: <0.1 kU/L
Oak, White IgE: <0.1 kU/L
Pecan, Hickory IgE: <0.1 kU/L
Penicillium Chrysogen IgE: <0.1 kU/L
Pigweed, Rough IgE: <0.1 kU/L
Ragweed, Short IgE: 0.15 kU/L — AB
Sheep Sorrel IgE Qn: <0.1 kU/L
Timothy Grass IgE: 16.3 kU/L — AB
White Mulberry IgE: <0.1 kU/L

## 2024-08-15 LAB — ALPHA-GAL PANEL
Allergen Lamb IgE: 9.95 kU/L — AB
Beef IgE: 23 kU/L — AB
IgE (Immunoglobulin E), Serum: 303 [IU]/mL (ref 6–495)
O215-IgE Alpha-Gal: 46.9 kU/L — AB
Pork IgE: 8.41 kU/L — AB

## 2024-08-18 ENCOUNTER — Ambulatory Visit: Payer: Self-pay | Admitting: Allergy

## 2024-09-01 ENCOUNTER — Ambulatory Visit (INDEPENDENT_AMBULATORY_CARE_PROVIDER_SITE_OTHER): Admitting: Certified Nurse Midwife

## 2024-09-01 ENCOUNTER — Encounter: Payer: Self-pay | Admitting: Certified Nurse Midwife

## 2024-09-01 VITALS — BP 120/76 | HR 85 | Ht 61.0 in | Wt 134.0 lb

## 2024-09-01 DIAGNOSIS — Z01419 Encounter for gynecological examination (general) (routine) without abnormal findings: Secondary | ICD-10-CM | POA: Insufficient documentation

## 2024-09-01 DIAGNOSIS — R87619 Unspecified abnormal cytological findings in specimens from cervix uteri: Secondary | ICD-10-CM | POA: Insufficient documentation

## 2024-09-01 DIAGNOSIS — I10 Essential (primary) hypertension: Secondary | ICD-10-CM

## 2024-09-01 DIAGNOSIS — R87610 Atypical squamous cells of undetermined significance on cytologic smear of cervix (ASC-US): Secondary | ICD-10-CM

## 2024-09-01 DIAGNOSIS — Z30015 Encounter for initial prescription of vaginal ring hormonal contraceptive: Secondary | ICD-10-CM

## 2024-09-01 DIAGNOSIS — Z309 Encounter for contraceptive management, unspecified: Secondary | ICD-10-CM | POA: Insufficient documentation

## 2024-09-01 MED ORDER — ETONOGESTREL-ETHINYL ESTRADIOL 0.12-0.015 MG/24HR VA RING: VAGINAL_RING | VAGINAL | 3 refills | Status: DC

## 2024-09-01 NOTE — Assessment & Plan Note (Signed)
-  Well controlled on labetalol  200mg  bid. -Nuvaring  is NOT contraindicated with well controlled HTN

## 2024-09-01 NOTE — Progress Notes (Addendum)
 ANNUAL EXAM Patient name: Dana Hood MRN [id number]  Date of birth: Apr 19, 1996 Chief Complaint:   Annual Exam  History of Present Illness:   Dana Hood is a 28 y.o. G30P2002 Caucasian female being seen today for a routine annual exam.  Current complaints: Wants to switch from POP to nuvaring . She's no longer lactating. POPs have caused her to have heavy, irregular periods. Has well controlled HTN for which she takes labetalol .   Patient's last menstrual period was 07/16/2024 (exact date).  The pregnancy intention screening data noted above was reviewed. Potential methods of contraception were discussed. The patient elected to proceed with No data recorded.      Component Value Date/Time   DIAGPAP (A) 01/01/2023 1637    - Atypical squamous cells of undetermined significance (ASC-US )   DIAGPAP  04/13/2020 1537    - Negative for intraepithelial lesion or malignancy (NILM)   HPVHIGH Negative 01/01/2023 1637   ADEQPAP  01/01/2023 1637    Satisfactory for evaluation; transformation zone component PRESENT.   ADEQPAP  04/13/2020 1537    Satisfactory for evaluation; transformation zone component PRESENT.   Last pap 12/2022. Results were: ASCUS w/ HRHPV negative. H/O abnormal pap: no. Per ASCCP guidelines, should have repeat pap w/ cotesting in 3 yrs (due 12/2025). Last mammogram: N/A. Results were: N/A. Family h/o breast cancer: no Last colonoscopy: N/A. Results were: N/A. Family h/o colorectal cancer: no     09/01/2024    1:41 PM 01/28/2022    8:50 AM 01/22/2021   11:35 AM 12/25/2020   11:28 AM 11/23/2020    2:41 PM  Depression screen PHQ 2/9  Decreased Interest 0 0 0 0 0  Down, Depressed, Hopeless 0 0 0 0 0  PHQ - 2 Score 0 0 0 0 0  Altered sleeping   0 0 1  Tired, decreased energy   1 0 1  Change in appetite   0 0 0  Feeling bad or failure about yourself    0 0 0  Trouble concentrating   1 1 0  Moving slowly or fidgety/restless   0 0 0  Suicidal thoughts   0 0 0   PHQ-9 Score   2 1 2   Difficult doing work/chores   Not difficult at all Not difficult at all         09/01/2024    1:41 PM 12/25/2020   11:26 AM 11/23/2020    2:42 PM 10/23/2020    1:59 PM  GAD 7 : Generalized Anxiety Score  Nervous, Anxious, on Edge 0 0 0 0  Control/stop worrying 0 0 1 1  Worry too much - different things 0 1 0 1  Trouble relaxing 0 0 1 1  Restless 0 0 0 1  Easily annoyed or irritable 0 0 1 0  Afraid - awful might happen 0 0 0 0  Total GAD 7 Score 0 1 3 4   Anxiety Difficulty  Not difficult at all Not difficult at all Not difficult at all      Past Medical History:  Diagnosis Date   Allergy to alpha-gal    Anxiety    Atrial fibrillation with tachycardic ventricular rate (HCC)    Hashimoto thyroiditis 08/12/2024   Hypertension    Maternal chronic hypertension, third trimester    Pre-existing hypertension affecting pregnancy 07/01/2023    Family History  Problem Relation Age of Onset   Hyperlipidemia Mother    Hypertension Mother    Migraines Mother  Hypertension Father    Hypertension Sister    Hyperlipidemia Sister    Hyperlipidemia Maternal Grandmother    Hypertension Maternal Grandmother    Heart failure Maternal Grandfather    Healthy Paternal Grandmother    Healthy Paternal Grandfather    Rheum arthritis Maternal Uncle    Breast cancer Neg Hx    Ovarian cancer Neg Hx    Colon cancer Neg Hx    Review of Systems:   Pertinent items are noted in HPI Denies any headaches, blurred vision, fatigue, shortness of breath, chest pain, abdominal pain, abnormal vaginal discharge/itching/odor/irritation, problems with periods, bowel movements, urination, or intercourse unless otherwise stated above. Pertinent History Reviewed:  Reviewed past medical,surgical, social and family history.  Reviewed problem list, medications and allergies. Physical Assessment:   Vitals:   09/01/24 1336  BP: 120/76  Pulse: 85  Weight: 60.8 kg  Height: 5' 1 (1.549 m)   Body mass index is 25.32 kg/m.       Physical Exam Constitutional:      Appearance: Normal appearance. She is normal weight.  HENT:     Head: Normocephalic and atraumatic.     Nose: Nose normal.     Mouth/Throat:     Mouth: Mucous membranes are moist.     Pharynx: Oropharynx is clear.  Eyes:     Extraocular Movements: Extraocular movements intact.     Conjunctiva/sclera: Conjunctivae normal.  Cardiovascular:     Rate and Rhythm: Normal rate and regular rhythm.  Pulmonary:     Effort: Pulmonary effort is normal.     Breath sounds: Normal breath sounds.  Chest:  Breasts:    Right: Normal.     Left: Normal.  Abdominal:     General: Abdomen is flat. Bowel sounds are normal.     Palpations: Abdomen is soft.  Musculoskeletal:        General: Normal range of motion.     Cervical back: Normal range of motion and neck supple.  Skin:    General: Skin is warm and dry.     Capillary Refill: Capillary refill takes less than 2 seconds.  Neurological:     General: No focal deficit present.     Mental Status: She is alert and oriented to person, place, and time.  Psychiatric:        Mood and Affect: Mood normal.        Behavior: Behavior normal.        Thought Content: Thought content normal.        Judgment: Judgment normal.      No results found for this or any previous visit (from the past 24 hours).  Assessment & Plan:  Chronic hypertension -Well controlled on labetalol  200mg  bid. -Nuvaring  is NOT contraindicated with well controlled HTN  Contraception management -Desires transition from POP to Nuvaring . Has used in the past and it worked well for her. -Well controlled HTN is NOT a contraindication for Nuvaring  -f/u 3 mos for BP check here. Notify us  if she has to go up on her labetalol  after starting the Nuvaring .  Abnormal Pap smear of cervix -Hx ASCUS, NEGATIVE for HR HPV in 12/2022.  -Per ASCCP guidelines, should have repeat pap with cotesting in 3 yrs. Due 1/ 2027.  Pt aware of same.   Women's annual routine gynecological examination Grossly normal physical exam Pap UTD HTN managed by PCP   Mammogram: @ 29yo, or sooner if problems Colonoscopy: @ 29yo, or sooner if problems  No orders of  the defined types were placed in this encounter.   Meds:  Meds ordered this encounter  Medications   etonogestrel -ethinyl estradiol  (NUVARING ) 0.12-0.015 MG/24HR vaginal ring    Sig: Insert vaginally and leave in place for 3 consecutive weeks, then remove for 1 week.    Dispense:  1 each    Refill:  3    Follow-up: Return in about 3 months (around 12/01/2024) for f/u on Nuvaring ; update blood pressure.  Charma DOMINO, CNM 09/12/2024 4:34 PM

## 2024-09-01 NOTE — Patient Instructions (Signed)
 Preventive Care 28-29 Years Old, Female  Preventive care refers to lifestyle choices and visits with your health care provider that can promote health and wellness. Preventive care visits are also called wellness exams. What can I expect for my preventive care visit? Counseling During your preventive care visit, your health care provider may ask about your: Medical history, including: Past medical problems. Family medical history. Pregnancy history. Current health, including: Menstrual cycle. Method of birth control. Emotional well-being. Home life and relationship well-being. Sexual activity and sexual health. Lifestyle, including: Alcohol, nicotine or tobacco, and drug use. Access to firearms. Diet, exercise, and sleep habits. Work and work Astronomer. Sunscreen use. Safety issues such as seatbelt and bike helmet use. Physical exam Your health care provider may check your: Height and weight. These may be used to calculate your BMI (body mass index). BMI is a measurement that tells if you are at a healthy weight. Waist circumference. This measures the distance around your waistline. This measurement also tells if you are at a healthy weight and may help predict your risk of certain diseases, such as type 2 diabetes and high blood pressure. Heart rate and blood pressure. Body temperature. Skin for abnormal spots. What immunizations do I need?  Vaccines are usually given at various ages, according to a schedule. Your health care provider will recommend vaccines for you based on your age, medical history, and lifestyle or other factors, such as travel or where you work. What tests do I need? Screening Your health care provider may recommend screening tests for certain conditions. This may include: Pelvic exam and Pap test. Lipid and cholesterol levels. Diabetes screening. This is done by checking your blood sugar (glucose) after you have not eaten for a while  (fasting). Hepatitis B test. Hepatitis C test. HIV (human immunodeficiency virus) test. STI (sexually transmitted infection) testing, if you are at risk. BRCA-related cancer screening. This may be done if you have a family history of breast, ovarian, tubal, or peritoneal cancers. Talk with your health care provider about your test results, treatment options, and if necessary, the need for more tests. Follow these instructions at home: Eating and drinking  Eat a healthy diet that includes fresh fruits and vegetables, whole grains, lean protein, and low-fat dairy products. Take vitamin and mineral supplements as recommended by your health care provider. Do not drink alcohol if: Your health care provider tells you not to drink. You are pregnant, may be pregnant, or are planning to become pregnant. If you drink alcohol: Limit how much you have to 0-1 drink a day. Know how much alcohol is in your drink. In the [place] one drink equals one 12 oz bottle of beer (355 mL), one 5 oz glass of wine (148 mL), or one 1 oz glass of hard liquor (44 mL). Lifestyle Brush your teeth every morning and night with fluoride toothpaste. Floss one time each day. Exercise for at least 30 minutes 5 or more days each week. Do not use any products that contain nicotine or tobacco. These products include cigarettes, chewing tobacco, and vaping devices, such as e-cigarettes. If you need help quitting, ask your health care provider. Do not use drugs. If you are sexually active, practice safe sex. Use a condom or other form of protection to prevent STIs. If you do not wish to become pregnant, use a form of birth control. If you plan to become pregnant, see your health care provider for a prepregnancy visit. Find healthy ways to manage stress, such as:  Meditation, yoga, or listening to music. Journaling. Talking to a trusted person. Spending time with friends and family. Minimize exposure to UV radiation to reduce your  risk of skin cancer. Safety Always wear your seat belt while driving or riding in a vehicle. Do not drive: If you have been drinking alcohol. Do not ride with someone who has been drinking. If you have been using any mind-altering substances or drugs. While texting. When you are tired or distracted. Wear a helmet and other protective equipment during sports activities. If you have firearms in your house, make sure you follow all gun safety procedures. Seek help if you have been physically or sexually abused. What's next? Go to your health care provider once a year for an annual wellness visit. Ask your health care provider how often you should have your eyes and teeth checked. Stay up to date on all vaccines. This information is not intended to replace advice given to you by your health care provider. Make sure you discuss any questions you have with your health care provider. Document Revised: 06/06/2021 Document Reviewed: 06/06/2021 Elsevier Patient Education  2024 [employer]     How to Do a Breast Self-Exam Doing breast self-exams can help you stay healthy. They're one way to know what's normal for your breasts. They can help you catch a problem while it's still small and can be treated. You need to: Check your breasts often. Tell your doctor about any changes. You should do breast self-exams even if you have breast implants. What you need: A mirror. A well-lit room. A pillow or other soft object. How to do a breast self-exam Look for changes  Take off all the clothes above your waist. Stand in front of a mirror in a room with good lighting. Put your hands down at your sides. Compare your breasts in the mirror. Look for difference between them, such as: Differences in shape. Differences in size. Wrinkles, dips, and bumps in one breast and not the other. Look at each breast for skin changes, such as: Redness. Scaly spots. Spots where your skin is  thicker. Dimpling. Open sores. Look for changes in your nipples, such as: Fluid coming out of a nipple. Fluid around a nipple. Bleeding. Dimpling. Redness. A nipple that looks pushed in or that has changed position. Feel for changes Lie on your back. Feel each breast. To do this: Pick a breast to feel. Place a pillow under the shoulder closest to that breast. Put the arm closest to that breast behind your head. Feel the breast using the hand of your other arm. Use the pads of your three middle fingers to make small circles starting near the nipple. Use light, medium, and firm pressure. Keep making circles, moving down over the breast. Stop when you feel your ribs. Start making circles with your fingers again, this time going up until you reach your collarbone. Then, make circles out across your breast and into your armpit area. Squeeze your nipple. Check for fluid and lumps. Do these steps again to check your other breast. Sit or stand in the tub or shower. With soapy water on your skin, feel each breast the same way you did when you were lying down. Write down what you find Writing down what you find can help you keep track of what you want to tell your doctor. Write down: What's normal for each breast. Any changes you find. Write down: The kind of change. If your breast feels tender or painful.  Any lump you find. Write down its size and where it is. When you last had your period. General tips If you're breastfeeding, the best time to check your breasts is after you feed your baby or after you use a breast pump. If you get a period, the best time to check your breasts is 5-7 days after your period ends. With time, you'll get more used to doing the self-exam. You'll also start to know if there are changes in your breasts. Contact a doctor if: You see a change in the shape or size of your breasts or nipples. You see a change in the skin of your breast or nipples. You have fluid  coming from your nipples that isn't normal. You find a new lump or thick area. You have breast pain. You have any concerns about your breast health. This information is not intended to replace advice given to you by your health care provider. Make sure you discuss any questions you have with your health care provider. Document Revised: 02/18/2024 Document Reviewed: 02/18/2024 Elsevier Patient Education  2025 [employer].

## 2024-09-01 NOTE — Assessment & Plan Note (Signed)
-  Desires transition from POP to Nuvaring . Has used in the past and it worked well for her. -Well controlled HTN is NOT a contraindication for Nuvaring  -f/u 3 mos for BP check here. Notify us  if she has to go up on her labetalol  after starting the Nuvaring .

## 2024-09-01 NOTE — Assessment & Plan Note (Signed)
-  Hx ASCUS, NEGATIVE for HR HPV in 12/2022.  -Per ASCCP guidelines, should have repeat pap with cotesting in 3 yrs. Due 1/ 2027. Pt aware of same.

## 2024-09-12 NOTE — Assessment & Plan Note (Signed)
 Grossly normal physical exam Pap UTD HTN managed by PCP

## 2024-10-25 ENCOUNTER — Encounter: Payer: Self-pay | Admitting: Certified Nurse Midwife

## 2024-10-26 ENCOUNTER — Other Ambulatory Visit: Payer: Self-pay | Admitting: Certified Nurse Midwife

## 2024-10-26 MED ORDER — NORETHINDRONE 0.35 MG PO TABS: 1.0000 | ORAL_TABLET | Freq: Every day | ORAL | 4 refills | Status: AC

## 2024-10-26 NOTE — Progress Notes (Signed)
 NuvaRing  causing irritation/pain, will restart norethindrone . Refill provided.

## 2025-01-09 ENCOUNTER — Other Ambulatory Visit: Payer: Self-pay | Admitting: Internal Medicine
